# Patient Record
Sex: Male | Born: 1950
Health system: Southern US, Community
[De-identification: ages and names within clinical notes are randomized; demographics above are authoritative.]

## PROBLEM LIST (undated history)

## (undated) DIAGNOSIS — E785 Hyperlipidemia, unspecified: Secondary | ICD-10-CM

## (undated) DIAGNOSIS — D689 Coagulation defect, unspecified: Principal | ICD-10-CM

## (undated) DIAGNOSIS — I82409 Acute embolism and thrombosis of unspecified deep veins of unspecified lower extremity: Secondary | ICD-10-CM

## (undated) DIAGNOSIS — I82509 Chronic embolism and thrombosis of unspecified deep veins of unspecified lower extremity: Secondary | ICD-10-CM

## (undated) DIAGNOSIS — D6851 Activated protein C resistance: Secondary | ICD-10-CM

## (undated) DIAGNOSIS — Z974 Presence of external hearing-aid: Secondary | ICD-10-CM

## (undated) DIAGNOSIS — D6859 Other primary thrombophilia: Secondary | ICD-10-CM

## (undated) DIAGNOSIS — G43909 Migraine, unspecified, not intractable, without status migrainosus: Secondary | ICD-10-CM

## (undated) DIAGNOSIS — M25879 Other specified joint disorders, unspecified ankle and foot: Secondary | ICD-10-CM

## (undated) DIAGNOSIS — D6852 Prothrombin gene mutation: Secondary | ICD-10-CM

## (undated) DIAGNOSIS — I2699 Other pulmonary embolism without acute cor pulmonale: Secondary | ICD-10-CM

## (undated) DIAGNOSIS — I8390 Asymptomatic varicose veins of unspecified lower extremity: Secondary | ICD-10-CM

## (undated) DIAGNOSIS — Z8669 Personal history of other diseases of the nervous system and sense organs: Secondary | ICD-10-CM

## (undated) DIAGNOSIS — K219 Gastro-esophageal reflux disease without esophagitis: Secondary | ICD-10-CM

## (undated) DIAGNOSIS — I493 Ventricular premature depolarization: Secondary | ICD-10-CM

## (undated) DIAGNOSIS — D6861 Antiphospholipid syndrome: Secondary | ICD-10-CM

## (undated) DIAGNOSIS — I499 Cardiac arrhythmia, unspecified: Secondary | ICD-10-CM

## (undated) HISTORY — DX: Ventricular premature depolarization: I49.3

## (undated) HISTORY — DX: Other specified joint disorders, unspecified ankle and foot: M25.879

## (undated) HISTORY — DX: Gastro-esophageal reflux disease without esophagitis: K21.9

## (undated) HISTORY — DX: Activated protein C resistance: D68.51

## (undated) HISTORY — DX: Other primary thrombophilia: D68.59

## (undated) HISTORY — DX: Other pulmonary embolism without acute cor pulmonale: I26.99

## (undated) HISTORY — DX: Chronic embolism and thrombosis of unspecified deep veins of unspecified lower extremity: I82.509

## (undated) HISTORY — DX: Asymptomatic varicose veins of unspecified lower extremity: I83.90

## (undated) HISTORY — DX: Acute embolism and thrombosis of unspecified deep veins of unspecified lower extremity: I82.409

## (undated) HISTORY — PX: APPENDECTOMY: SHX54

## (undated) HISTORY — DX: Coagulation defect, unspecified: D68.9

## (undated) HISTORY — PX: FINGER SURGERY: SHX640

## (undated) HISTORY — DX: Migraine, unspecified, not intractable, without status migrainosus: G43.909

## (undated) HISTORY — DX: Personal history of other diseases of the nervous system and sense organs: Z86.69

## (undated) HISTORY — DX: Prothrombin gene mutation: D68.52

## (undated) HISTORY — DX: Presence of external hearing-aid: Z97.4

## (undated) HISTORY — DX: Antiphospholipid syndrome: D68.61

## (undated) HISTORY — DX: Hyperlipidemia, unspecified: E78.5

---

## 1990-09-05 HISTORY — PX: PATELLA RELEASE AND MANIPULATION: SHX2180

## 2001-03-05 ENCOUNTER — Encounter: Admission: RE | Admit: 2001-03-05 | Discharge: 2001-03-05 | Payer: Self-pay | Admitting: Internal Medicine

## 2001-03-05 ENCOUNTER — Inpatient Hospital Stay (HOSPITAL_COMMUNITY): Admission: AD | Admit: 2001-03-05 | Discharge: 2001-03-11 | Payer: Self-pay | Admitting: Internal Medicine

## 2001-03-05 ENCOUNTER — Encounter: Payer: Self-pay | Admitting: Internal Medicine

## 2001-03-06 ENCOUNTER — Encounter: Payer: Self-pay | Admitting: Internal Medicine

## 2001-06-22 ENCOUNTER — Ambulatory Visit: Admission: RE | Admit: 2001-06-22 | Discharge: 2001-06-22 | Payer: Self-pay | Admitting: Internal Medicine

## 2001-07-05 ENCOUNTER — Ambulatory Visit (HOSPITAL_COMMUNITY): Admission: RE | Admit: 2001-07-05 | Discharge: 2001-07-05 | Payer: Self-pay | Admitting: Internal Medicine

## 2001-07-05 ENCOUNTER — Encounter: Payer: Self-pay | Admitting: Internal Medicine

## 2001-07-19 ENCOUNTER — Ambulatory Visit (HOSPITAL_COMMUNITY): Admission: RE | Admit: 2001-07-19 | Discharge: 2001-07-19 | Payer: Self-pay | Admitting: Oncology

## 2001-07-19 ENCOUNTER — Encounter: Payer: Self-pay | Admitting: Oncology

## 2001-09-18 ENCOUNTER — Encounter: Payer: Self-pay | Admitting: Internal Medicine

## 2001-09-18 ENCOUNTER — Encounter: Admission: RE | Admit: 2001-09-18 | Discharge: 2001-09-18 | Payer: Self-pay | Admitting: Internal Medicine

## 2001-11-07 ENCOUNTER — Encounter: Payer: Self-pay | Admitting: Internal Medicine

## 2001-11-07 ENCOUNTER — Ambulatory Visit (HOSPITAL_COMMUNITY): Admission: RE | Admit: 2001-11-07 | Discharge: 2001-11-07 | Payer: Self-pay | Admitting: Internal Medicine

## 2002-04-04 ENCOUNTER — Ambulatory Visit (HOSPITAL_COMMUNITY): Admission: RE | Admit: 2002-04-04 | Discharge: 2002-04-04 | Payer: Self-pay | Admitting: Neurology

## 2002-04-04 ENCOUNTER — Encounter: Payer: Self-pay | Admitting: Neurology

## 2002-05-13 ENCOUNTER — Encounter: Payer: Self-pay | Admitting: Neurology

## 2002-05-13 ENCOUNTER — Ambulatory Visit (HOSPITAL_COMMUNITY): Admission: RE | Admit: 2002-05-13 | Discharge: 2002-05-13 | Payer: Self-pay | Admitting: Neurology

## 2003-09-06 HISTORY — PX: ANKLE SURGERY: SHX546

## 2004-03-10 ENCOUNTER — Ambulatory Visit (HOSPITAL_BASED_OUTPATIENT_CLINIC_OR_DEPARTMENT_OTHER): Admission: RE | Admit: 2004-03-10 | Discharge: 2004-03-10 | Payer: Self-pay | Admitting: Orthopedic Surgery

## 2004-03-10 ENCOUNTER — Ambulatory Visit (HOSPITAL_COMMUNITY): Admission: RE | Admit: 2004-03-10 | Discharge: 2004-03-10 | Payer: Self-pay | Admitting: Orthopedic Surgery

## 2004-08-03 ENCOUNTER — Encounter: Admission: RE | Admit: 2004-08-03 | Discharge: 2004-08-03 | Payer: Self-pay | Admitting: Internal Medicine

## 2005-05-13 ENCOUNTER — Ambulatory Visit: Payer: Self-pay | Admitting: Oncology

## 2006-05-18 ENCOUNTER — Ambulatory Visit: Payer: Self-pay | Admitting: Oncology

## 2006-05-22 LAB — CBC WITH DIFFERENTIAL/PLATELET
Basophils Absolute: 0.1 10*3/uL (ref 0.0–0.1)
EOS%: 2 % (ref 0.0–7.0)
Eosinophils Absolute: 0.1 10*3/uL (ref 0.0–0.5)
HCT: 42.1 % (ref 38.7–49.9)
HGB: 14 g/dL (ref 13.0–17.1)
LYMPH%: 29.4 % (ref 14.0–48.0)
MCH: 28.8 pg (ref 28.0–33.4)
MCHC: 33.3 g/dL (ref 32.0–35.9)
MCV: 86.4 fL (ref 81.6–98.0)
MONO#: 0.5 10*3/uL (ref 0.1–0.9)
MONO%: 7.9 % (ref 0.0–13.0)
RDW: 12.4 % (ref 11.2–14.6)

## 2006-05-22 LAB — PROTIME-INR
INR: 4.5 — ABNORMAL HIGH (ref 2.00–3.50)
Protime: 54 Seconds — ABNORMAL HIGH (ref 10.6–13.4)

## 2006-09-05 HISTORY — PX: CHOLECYSTECTOMY: SHX55

## 2006-11-27 ENCOUNTER — Encounter: Admission: RE | Admit: 2006-11-27 | Discharge: 2006-11-27 | Payer: Self-pay | Admitting: Internal Medicine

## 2007-02-09 ENCOUNTER — Encounter: Admission: RE | Admit: 2007-02-09 | Discharge: 2007-02-09 | Payer: Self-pay | Admitting: Orthopedic Surgery

## 2007-03-30 ENCOUNTER — Ambulatory Visit (HOSPITAL_COMMUNITY): Admission: RE | Admit: 2007-03-30 | Discharge: 2007-03-30 | Payer: Self-pay | Admitting: *Deleted

## 2007-04-03 ENCOUNTER — Ambulatory Visit (HOSPITAL_COMMUNITY): Admission: RE | Admit: 2007-04-03 | Discharge: 2007-04-04 | Payer: Self-pay | Admitting: General Surgery

## 2007-04-03 ENCOUNTER — Encounter (INDEPENDENT_AMBULATORY_CARE_PROVIDER_SITE_OTHER): Payer: Self-pay | Admitting: General Surgery

## 2007-05-17 ENCOUNTER — Ambulatory Visit: Payer: Self-pay | Admitting: Oncology

## 2007-05-21 LAB — CBC WITH DIFFERENTIAL/PLATELET
BASO%: 0.9 % (ref 0.0–2.0)
HCT: 39.8 % (ref 38.7–49.9)
LYMPH%: 34.2 % (ref 14.0–48.0)
MCHC: 34.2 g/dL (ref 32.0–35.9)
MCV: 85.8 fL (ref 81.6–98.0)
MONO%: 7.1 % (ref 0.0–13.0)
NEUT#: 3.8 10*3/uL (ref 1.5–6.5)
Platelets: 247 10*3/uL (ref 145–400)
RBC: 4.64 10*6/uL (ref 4.20–5.71)
RDW: 11.5 % (ref 11.2–14.6)

## 2007-05-21 LAB — PROTIME-INR

## 2007-07-27 ENCOUNTER — Encounter: Admission: RE | Admit: 2007-07-27 | Discharge: 2007-07-27 | Payer: Self-pay | Admitting: Neurology

## 2008-05-15 ENCOUNTER — Ambulatory Visit: Payer: Self-pay | Admitting: Oncology

## 2008-05-20 LAB — CBC WITH DIFFERENTIAL/PLATELET
HCT: 44.7 % (ref 38.7–49.9)
HGB: 14.8 g/dL (ref 13.0–17.1)
MCH: 28.4 pg (ref 28.0–33.4)
MCHC: 33.1 g/dL (ref 32.0–35.9)
MONO%: 7 % (ref 0.0–13.0)
Platelets: 238 10*3/uL (ref 145–400)
RDW: 12.7 % (ref 11.2–14.6)

## 2009-03-08 ENCOUNTER — Emergency Department (HOSPITAL_COMMUNITY): Admission: EM | Admit: 2009-03-08 | Discharge: 2009-03-09 | Payer: Self-pay | Admitting: Emergency Medicine

## 2009-03-09 ENCOUNTER — Encounter (INDEPENDENT_AMBULATORY_CARE_PROVIDER_SITE_OTHER): Payer: Self-pay | Admitting: Internal Medicine

## 2009-03-09 ENCOUNTER — Ambulatory Visit: Payer: Self-pay | Admitting: Vascular Surgery

## 2009-03-09 ENCOUNTER — Ambulatory Visit (HOSPITAL_COMMUNITY): Admission: RE | Admit: 2009-03-09 | Discharge: 2009-03-09 | Payer: Self-pay | Admitting: Psychiatry

## 2009-05-15 ENCOUNTER — Ambulatory Visit: Payer: Self-pay | Admitting: Oncology

## 2009-05-19 LAB — CBC WITH DIFFERENTIAL/PLATELET
BASO%: 0.4 % (ref 0.0–2.0)
Basophils Absolute: 0 10*3/uL (ref 0.0–0.1)
EOS%: 2.1 % (ref 0.0–7.0)
Eosinophils Absolute: 0.1 10*3/uL (ref 0.0–0.5)
HCT: 42 % (ref 38.4–49.9)
HGB: 14.1 g/dL (ref 13.0–17.1)
LYMPH%: 27.9 % (ref 14.0–49.0)
MCV: 85.9 fL (ref 79.3–98.0)
MONO#: 0.5 10*3/uL (ref 0.1–0.9)
MONO%: 6.7 % (ref 0.0–14.0)
NEUT#: 4.2 10*3/uL (ref 1.5–6.5)
NEUT%: 62.9 % (ref 39.0–75.0)
RDW: 13.4 % (ref 11.0–14.6)
WBC: 6.7 10*3/uL (ref 4.0–10.3)
lymph#: 1.9 10*3/uL (ref 0.9–3.3)

## 2009-05-19 LAB — PROTIME-INR: INR: 2.8 (ref 2.00–3.50)

## 2009-07-02 ENCOUNTER — Ambulatory Visit: Payer: Self-pay | Admitting: Sports Medicine

## 2009-07-02 DIAGNOSIS — R269 Unspecified abnormalities of gait and mobility: Secondary | ICD-10-CM | POA: Insufficient documentation

## 2009-07-02 DIAGNOSIS — S838X9A Sprain of other specified parts of unspecified knee, initial encounter: Secondary | ICD-10-CM

## 2009-07-02 DIAGNOSIS — S86819A Strain of other muscle(s) and tendon(s) at lower leg level, unspecified leg, initial encounter: Secondary | ICD-10-CM

## 2009-10-05 ENCOUNTER — Encounter: Admission: RE | Admit: 2009-10-05 | Discharge: 2009-10-05 | Payer: Self-pay | Admitting: Internal Medicine

## 2009-12-31 ENCOUNTER — Encounter: Admission: RE | Admit: 2009-12-31 | Discharge: 2009-12-31 | Payer: Self-pay | Admitting: Otolaryngology

## 2010-02-25 ENCOUNTER — Encounter: Admission: RE | Admit: 2010-02-25 | Discharge: 2010-02-25 | Payer: Self-pay | Admitting: Internal Medicine

## 2010-05-14 ENCOUNTER — Ambulatory Visit: Payer: Self-pay | Admitting: Oncology

## 2010-05-18 LAB — CBC WITH DIFFERENTIAL/PLATELET
Basophils Absolute: 0 10*3/uL (ref 0.0–0.1)
MCH: 29.1 pg (ref 27.2–33.4)
MCHC: 33.5 g/dL (ref 32.0–36.0)
MONO%: 7 % (ref 0.0–14.0)
NEUT#: 4.1 10*3/uL (ref 1.5–6.5)
NEUT%: 61.7 % (ref 39.0–75.0)
WBC: 6.7 10*3/uL (ref 4.0–10.3)

## 2010-05-18 LAB — COMPREHENSIVE METABOLIC PANEL
Albumin: 4.2 g/dL (ref 3.5–5.2)
BUN: 18 mg/dL (ref 6–23)
CO2: 29 mEq/L (ref 19–32)
Calcium: 8.8 mg/dL (ref 8.4–10.5)
Chloride: 106 mEq/L (ref 96–112)
Creatinine, Ser: 1.16 mg/dL (ref 0.40–1.50)
Glucose, Bld: 106 mg/dL — ABNORMAL HIGH (ref 70–99)
Potassium: 3.7 mEq/L (ref 3.5–5.3)
Sodium: 140 mEq/L (ref 135–145)
Total Bilirubin: 0.8 mg/dL (ref 0.3–1.2)
Total Protein: 7 g/dL (ref 6.0–8.3)

## 2010-05-18 LAB — PROTIME-INR
INR: 3.7 — ABNORMAL HIGH (ref 2.00–3.50)
Protime: 44.4 Seconds — ABNORMAL HIGH (ref 10.6–13.4)

## 2010-09-26 ENCOUNTER — Encounter: Payer: Self-pay | Admitting: Internal Medicine

## 2010-12-12 LAB — CBC
HCT: 38.7 % — ABNORMAL LOW (ref 39.0–52.0)
MCV: 88.6 fL (ref 78.0–100.0)
Platelets: 207 10*3/uL (ref 150–400)
RBC: 4.37 MIL/uL (ref 4.22–5.81)
RDW: 13.2 % (ref 11.5–15.5)
WBC: 5.6 10*3/uL (ref 4.0–10.5)

## 2010-12-12 LAB — POCT I-STAT, CHEM 8
Calcium, Ion: 1.11 mmol/L — ABNORMAL LOW (ref 1.12–1.32)
HCT: 40 % (ref 39.0–52.0)

## 2010-12-12 LAB — PROTIME-INR: Prothrombin Time: 38.5 seconds — ABNORMAL HIGH (ref 11.6–15.2)

## 2011-01-18 NOTE — Op Note (Signed)
NAME:  PANFILO, KETCHUM NO.:  [id number]   MEDICAL RECORD NO.:  [id number]          PATIENT TYPE:  OIB   LOCATION:  1435                         FACILITY:  [facility]   PHYSICIAN:  Anselm Pancoast. Weatherly, M.D.DATE OF BIRTH:  November 08, 1950   DATE OF PROCEDURE:  04/03/2007  DATE OF DISCHARGE:                               OPERATIVE REPORT   PREOPERATIVE DIAGNOSES:  1. Chronic/subacute cholecystitis with stones.  2. History of multiple pulmonary emboli.   POSTOPERATIVE DIAGNOSES:  1. Chronic/subacute cholecystitis with stones.  2. History of multiple pulmonary emboli.   OPERATION:  Laparoscopic cholecystectomy with cholangiogram.  General  anesthesia.   SURGEON:  Anselm Pancoast. Zachery Dakins, M.D.   ASSISTANT:  Nurse.   HISTORY:  Don Ruiz is a 60 year old male who I first saw in the  office on Friday when he had three days of kind of persistent recurrent  right upper quadrant pain.  He had been seen by Dr. Valentina Lucks, his regular  physician who had attained an ultrasound of gallbladder early on Friday  and showed kind of a thickened gallbladder with stones.  He has a  history of multiple PE's.  He is followed by Dr. Valentina Lucks for prothrombin  times and also Dr. Cyndie Chime, hematologist and he definitely needs  chronic anticoagulation and we ask him to stop his Coumadin on Friday,  switched him on over to Lovenox starting on Saturday.  His INR was 1.4  today and we thought we could safely proceed on with a laparoscopic  cholecystectomy.  He has been without the Lovenox now for approximately  24 hours.  Preoperatively was given 3 grams of Unasyn.  He has PAS  stockings, taken back to the operative suite.  Induction of general  anesthesia, endotracheal tube, oral tube NG was placed into the stomach.  The abdomen was then shaved around the umbilicus.  He has had an  appendectomy, kind of a large paramedian incision and we clipped the  area in the upper abdomen.  Sharp dissection  after Betadine prep  identifying the fascia.  This was opened, small incision made through  the peritoneum after two Kochers on the fascia and then a pursestring  suture of 0-0 Vicryl placed, and Hasson cannula introduced.  When  inserting the camera in the upper abdomen I was surprised to see that he  has got a subacutely inflamed gallbladder, was thickened and the upper  10 mm trocar was placed under direct vision.  The two lateral 5 mm was  placed under the direct vision also.  The gallbladder was tense but  fortunately I thought I could go ahead and dissected out without  actually decompressed the gallbladder and the peritoneum around the  proximal portion of gallbladder was carefully opened.  The little  adipose tissue was stripped down and then I could identify the cystic  artery that was doubly clipped proximally, singly distally and divided.  I could then encompass the cystic duct which was quite prominent and I  placed a clip across the distal cystic duct gallbladder junction.  A  Cook catheter was introduced a little  opening proximal to the clip and  held in place with clip.  Cholangiograms then obtained.  There was good  flow of dye into the common bile duct and common hepatic duct visualized  and looks like about 3 cm cystic duct remnant.  I stripped this down  slightly so I could put three clips after I removed the catheter across  the cystic duct.  I got across that satisfactory and then divided the  cystic duct.  Then the peritoneum around the distal posterior portion of  gallbladder was carefully kind of  opened up.  I clipped the little area  where there may be a posterior branch of the cystic artery and then used  electrocautery to free the gallbladder from its bed.  As we were  finalizing the dissection there was a little bit of bile spillage.  I  placed the gallbladder within an EndoCatch bag and then thoroughly  irrigated the right upper quadrant.  Hemostasis was good  and I used the  electrocautery to kind of cauterize any areas of question and then  switched the camera to the upper 10 mm port and withdrew the bag  containing the gallbladder.  I put a figure-of-eight of 0-0 Vicryl in  addition to the pursestring at the umbilicus, tied them both and then  anesthetized the fascia.  All total about 22 mL of Marcaine with  Adrenalin was used.  The little irrigating fluid that we had used was  aspirated and then the 5 mm ports withdrawn under direct vision.  No  evidence of bleeding and then the carbon dioxide released and the upper  10 mm port withdrawn.  The subcutaneous wounds were closed with 4-0  Vicryl.  Benzoin and Steri-Strips on the skin.  The patient tolerated  procedure nicely and Dr. Cyndie Chime suggested that we start him on  Lovenox at a reduced dose this evening and then back on his regular dose  tomorrow and will restart Coumadin today.  Hopefully he will be ready to  be discharged tomorrow and will be followed by Dr. Valentina Lucks for  prothrombin times probably continue the Lovenox I expect until Monday.           ______________________________  Anselm Pancoast. Zachery Dakins, M.D.     WJW/MEDQ  D:  04/03/2007  T:  04/04/2007  Job:  [id number]   cc:   Dr. Mauro Kaufmann M. Cyndie Chime, M.D.  Fax: [phone]

## 2011-01-21 NOTE — H&P (Signed)
[place]. [facility]  Patient:    INMAN, FETTIG                         MRN: [id number] Adm. Date:  03/05/01 Attending:  Thora Lance, M.D. Dictator:   N.P.                         History and Physical  DATE OF BIRTH:  1951-08-07  CHIEF COMPLAINT:  Dyspnea, chest pain.  HISTORY OF PRESENT ILLNESS:  Don Ruiz is a very pleasant 60 year old white male patient of Thora Lance, M.D. who presents to the office today with a 9-day history of dyspnea on minimal exertion and pleuritic anterior chest pain.  The patient describes lower anterior chest burning that occurs all the time, but definitely worse with breathing.  Sometimes it is associated with sweats and nausea.  Sometimes, he will get some palpitations with it.  He has been dyspneic with the least amount of activity during this time.  No fever, chills, or cough.  No peripheral edema.  He had been on prednisone several days prior to his symptoms for problems he was having with a left ear.  He saw an ENT physician at that time who felt like he had a viral or a vascular problem with the left ear.  He was also on antivirals during that time.  The dyspnea and chest pain developed while on prednisone.  The prednisone did not seem to help his symptoms.  In the office today, he was found to be in no acute distress.  Due to his history of DVT in 1992 postoperatively for knee surgery, a spiral CT of the chest was obtained.  It showed bilateral clots in the lower lung fields, worse on the right side.  He is being admitted for the same.  REVIEW OF SYSTEMS:  No fever or chills.  HEENT: As above.  No sore throat. LUNGS: Dyspnea with the least amount of exertion.  CHEST: Burning in the lower sternal area, worse with taking in deep breaths.  ABDOMEN: Some nausea rarely, no vomiting.  EXTREMITIES: No edema.  He does tell me that he had some sharp posterior calf pain that lasted for about a second several  weeks back.  He cannot tell me anymore specifics relating to this.  PAST MEDICAL HISTORY:  Infrequent migraine headaches relieved by Imitrex. History of DVT, November of 1992, postoperatively for knee surgery.  Coumadin x 3 months.  Questionable hepatitis in 1972.  Hepatitis surface antibody has been negative and LFTs are normal.  Family history of coronary artery disease. He had three brothers with CAD in their early 109s.  All siblings are on cholesterol medication.  PAST SURGICAL HISTORY:  Appendectomy in May of 1956.  Compartmental syndrome, both legs, November of 1984.  Patella repair in November of 1992 complicated by postoperative DVT.  SOCIAL HISTORY:  He is married.  He does not smoke.  No alcohol or drugs.  He has three daughters ages 63 through 21.  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  FAMILY HISTORY:  Father died at age 45 with a stroke.  He had a history of coronary artery disease in his 26s and had myocardial infarction at age 8. Mother died at 32 with complications from breast cancer.  She had hypertension, diabetes, and fibromyalgia.  Three brothers have coronary artery disease in their  30s or early 48s, all siblingus with hypercholesterolemia.  MEDICATIONS:  Imitrex rarely.  ALLERGIES:  No known drug allergies.  PHYSICAL EXAMINATION:  VITAL SIGNS:  His weight is 214.5 which is stable, temperature 97.6, pulse 64, respirations 22 breaths per minute, blood pressure 120/70.  O2 saturations on room air 98%.  GENERAL:  He is a very pleasant male in no acute distress.  HEENT:  PERRLA.  No nystagmus.  Ears, nose, and throat without abnormality.  NECK:  No JVD or carotid bruits auscultated.  HEART:  Regular rate and rhythm.  I hear no murmur or rub.  Breath sounds reveal a faint wheeze in the right lower lobe, otherwise clear.  There is no chest wall tenderness to palpation.  ABDOMEN:  Normal bowel sounds with no tenderness on  palpation.  GENITOURINARY: RECTAL:  Deferred.  NEUROLOGICAL:  Nonfocal.  EXTREMITIES:  No edema, no cords, +2 peripheral pulses.  Spiral CT scan done at [facility] reveals bilateral pulmonary emboli, right worse than left per Dr. Allyson Sabal.  IMPRESSION:  Bilateral pulmonary emboli, history of postoperative deep venous thrombosis in 1992.  PLAN:  IV heparin and Coumadin per pharmacy protocol.  Dr. Valentina Lucks and associates to follow. DD:  03/05/01 TD:  03/05/01 Job: [id number]

## 2011-01-21 NOTE — Discharge Summary (Signed)
[place]. [facility]  Patient:    Don Ruiz, Don Ruiz Visit Number: [id number] MRN: [id number]          Service Type: MED Location: [phone] Attending Physician:  Lillia Mountain Admit Date:  03/05/2001 Discharge Date: 03/11/2001   CC:         Darci Needle, M.D.                           Discharge Summary  REASON FOR ADMISSION:  Mr. Arvil Chaco is a 60 year old white male who presented to the office with nine days history of dyspnea on minimal exertion and had pleuritic anterior chest pain.  The patient is an avid runner and began experiencing dyspnea with diaphoresis with running only a short distance.  He occasionally would get diaphoretic with nausea.  The patient has a history of DVT in 1992.  He had been on Coumadin for three months at that time.  The chest CT scan was obtained and showed bilateral lung clots worse in the right side.  PHYSICAL EXAMINATION:  Significant findings:  GENERAL:  He looked well.  VITAL SIGNS:  Temperature 97.6, heart rate 64, respirations 22, blood pressure 120/70.  Oxygen saturation was 97% on room air.  LUNGS:  Show wheezes at the right lower lobe, otherwise clear.  HEART:  Unremarkable.  EXTREMITIES:  Showed no edema or tender to palpation.  LABORATORIES:  WBC 12.2, hemoglobin 14.5, platelet count 203.  PT 12.6, PTT 34.  Chemistry, sodium 138, potassium 4.0, chloride 102, bicarbonate 30, BUN 16, creatinine 0.9, glucose 95, calcium 8.8.  Total protein 6.8, albumin 3.6, AST 28, ALT 38, alkaline phosphatase 68, total bilirubin 1.0.  Urinalysis was negative.  CT scan of the chest showed bilateral lower lobe pulmonary emboli, right greater than left.  HOSPITAL COURSE: #1 - PULMONARY EMBOLUS:  The patient was admitted for bilateral pulmonary emboli.  He was started on IV heparin and immediately begun on Coumadin.  A lower extremity Doppler exam was done and showed no evidence of venous DVT. In the first two  hospital days, the patient experienced significant right-sided back and chest pleuritic pain.  A chest x-ray was done and was negative.  The patient also had fever as high as 102.6.  Blood and urine cultures were negative.  There was no obvious infection and this was felt secondary to his thromboembolic disease.  The patient was treated with Percocet p.r.n. for his pain.  The patients chest pain slowly improved throughout the hospitalization.  He was able to ambulate without significant shortness of breath or pain at discharge, and was discharged after six days. The patient did undergo a hypercoagulability workup as this was his second episode of thromboembolic disease.  The patient was heterozygous for the factor V mutation.  The patient was heterozygous for the prothrombin gene mutation.  Anticardiolipin antibodies showed normal levels of IgA and IgG antibodies, but an elevated IgM antibody.  Protein S functional level was mildly decreased at 67 (probably reflects the acute thrombosis).  The remaining workup including antithrombin III activity, protein C activity, lupus anticoagulant, and homocysteine level were all negative or normal.  #2 - QUESTIONABLE RIGHT UPPER LOBE NODULE:  There was a question of a very small right upper lobe nodule on the CT scan.  Radiology recommended repeating the CT scan in three or four months to re-evaluate this.  DISCHARGE DIAGNOSES: 1. Bilateral pulmonary emboli. 2. Questionable right upper  lobe nodule. 3. Factor V Leiden and prothrombin gene mutation, heterozygosity.  PROCEDURES:  CT scan of the chest.  DISCHARGE MEDICATIONS: 1. Coumadin 5 mg q.d. 2. Percocet 5/325 one or two q.4h. p.r.n. pain.  ACTIVITY:  Do not return to work until the follow-up appointment.  No heavy exertion until follow-up appointment.  FOLLOW-UP:  Follow-up will be on July 15, with Dr. Valentina Lucks. Attending Physician:  Lillia Mountain DD:  03/21/01 TD:  03/21/01 Job:  [id number]

## 2011-01-21 NOTE — Op Note (Signed)
NAME:  Don Ruiz, Don Ruiz NO.:  [id number]   MEDICAL RECORD NO.:  [id number]                   PATIENT TYPE:  AMB   LOCATION:  DSC                                  FACILITY:  [facility]   PHYSICIAN:  Harvie Junior, M.D.                DATE OF BIRTH:  06-Jan-1951   DATE OF PROCEDURE:  DATE OF DISCHARGE:                                 OPERATIVE REPORT   DATE OF OPERATION:  March 10, 2004.   PREOPERATIVE DIAGNOSIS:  Chronic ankle pain, right.   POSTOPERATIVE DIAGNOSES:  1. Lateral gutter impingement.  2. Anterior tibial spur.  3. Loose body, ankle joint.   PRINCIPAL PROCEDURES:  1. Debridement of lateral gutter impingement.  2. Removal of anterior tibial spur.  3. Removal of loose body.   SURGEON:  Harvie Junior, MD.   ASSISTANT:  __________   ANESTHESIA:  General.   BRIEF HISTORY:  This is a 60 year old male with a long history of having had  right ankle pain.  He ultimately had tried to run on several occasions.  He  would have sudden onset severe pain.  Because of this sudden onset severe  pain, ultimately it was felt that the patient must have some form of loose  body.  He had been treated with injection therapy, which seems to help  wonderfully, but ultimately had had recurrent episodes of severe pain.  At  that time, it was felt that the most appropriate course of action for him  would be injection therapy.  This was accomplished and he got excellent  relief with that.  It was felt that operative ankle arthroscopy would be the  appropriate course of action if his pain recurred and, in fact, it did.  He  was brought to the operating room for that procedure.   PROCEDURE:  The patient was brought to the operating room and after adequate  anesthesia was obtained with general anesthetic, the patient was placed on  the operating table, and the right leg was prepped and draped in the usual  sterile fashion.  Following this, the leg was exsanguinated  and blood  pressure insufflated to 250 mmHg at the calf.  The patient had been given  relaxation agents, and the ankle underwent evaluation.  There was excellent  access into the lateral gutter which showed to have fairly significant  synovitic changes as well as a lateral ligamentous impingement anteriorly.  This was debrided with a suction shaver and a biter back to a smooth and  stable rim.  The lateral gutter was then opened and cleaned and was  unremarkable.  At this time, attention was turned across the front of the  ankle where there was noted to be a large, anterior, tibial spur.  The  medial gutter was identified and noted to have a osseocartilaginous loose  spot, which was removed with a grasper.  The  medial gutter was then cleaned.  Following this, the anterior tibial spur was taken down with a motorized  bur.  Following this, the remainder of the joint was examined and noted to  have no significant degenerative changes.  At this point, the ankle was  copiously irrigated and suctioned dry.  The arthroscope portals were closed  with interrupted stitches and a sterile compressive dressing was applied as  well as a posterior plaster, and the patient was taken to the recovery room.  He was noted to be in a satisfactory condition.  The patient was done with a  protime of 20.  This was as per request per his hematologist, who had a  significant concern of possible blood clots, especially with the tourniquet  that was going to be used.  He is going to evaluate him later today at his  office for placement on heparin and Coumadin therapy simultaneously until he  is back therapeutic with a comfortable level with Coumadin.  At this point,  the patient will be maintained in his posterior plaster with ice and  elevation for 5 days.  We will see him back in the office at that point and  begin early range of motion.                                               Harvie Junior, M.D.     Ranae Plumber  D:  03/10/2004  T:  03/10/2004  Job:  [id number]

## 2011-05-17 ENCOUNTER — Other Ambulatory Visit: Payer: Self-pay | Admitting: Oncology

## 2011-05-17 ENCOUNTER — Encounter (HOSPITAL_BASED_OUTPATIENT_CLINIC_OR_DEPARTMENT_OTHER): Payer: 59 | Admitting: Oncology

## 2011-05-17 DIAGNOSIS — D689 Coagulation defect, unspecified: Secondary | ICD-10-CM

## 2011-05-17 DIAGNOSIS — Z7901 Long term (current) use of anticoagulants: Secondary | ICD-10-CM

## 2011-05-17 DIAGNOSIS — D6859 Other primary thrombophilia: Secondary | ICD-10-CM

## 2011-05-17 DIAGNOSIS — Z86718 Personal history of other venous thrombosis and embolism: Secondary | ICD-10-CM

## 2011-05-17 LAB — CBC WITH DIFFERENTIAL/PLATELET
Eosinophils Absolute: 0.1 10*3/uL (ref 0.0–0.5)
HCT: 42.4 % (ref 38.4–49.9)
MCV: 86.2 fL (ref 79.3–98.0)
MONO#: 0.5 10*3/uL (ref 0.1–0.9)
MONO%: 7.6 % (ref 0.0–14.0)
Platelets: 194 10*3/uL (ref 140–400)
RBC: 4.92 10*6/uL (ref 4.20–5.82)
RDW: 13.3 % (ref 11.0–14.6)
lymph#: 1.9 10*3/uL (ref 0.9–3.3)
nRBC: 0 % (ref 0–0)

## 2011-05-17 LAB — PROTIME-INR: Protime: 48 Seconds — ABNORMAL HIGH (ref 10.6–13.4)

## 2011-06-20 LAB — CBC
HCT: 40
Hemoglobin: 13.6
MCHC: 34.3
MCV: 85.8
Platelets: 249
Platelets: 254
RBC: 4.52
RBC: 4.67
RDW: 13.8
WBC: 6.8

## 2011-06-20 LAB — COMPREHENSIVE METABOLIC PANEL
ALT: 58 — ABNORMAL HIGH
AST: 70 — ABNORMAL HIGH
Albumin: 3.8
Albumin: 4
Alkaline Phosphatase: 64
Alkaline Phosphatase: 70
BUN: 17
BUN: 17
CO2: 28
Chloride: 103
Chloride: 110
GFR calc Af Amer: >60
Potassium: 4
Potassium: 4.1
Sodium: 136
Total Bilirubin: 1.1
Total Bilirubin: 1.4 — ABNORMAL HIGH
Total Protein: 7.5

## 2011-06-20 LAB — DIFFERENTIAL
Basophils Absolute: 0
Basophils Relative: 1
Eosinophils Relative: 2
Monocytes Absolute: 0.4
Neutro Abs: 4.1

## 2011-06-20 LAB — PROTIME-INR
INR: 1.1
Prothrombin Time: 14.7

## 2011-06-20 LAB — APTT: aPTT: 43 — ABNORMAL HIGH

## 2011-09-06 HISTORY — PX: FINGER TENDON REPAIR: SHX1640

## 2011-10-17 ENCOUNTER — Emergency Department (HOSPITAL_COMMUNITY): Payer: 59

## 2011-10-17 ENCOUNTER — Encounter (HOSPITAL_COMMUNITY): Payer: Self-pay

## 2011-10-17 ENCOUNTER — Emergency Department (HOSPITAL_COMMUNITY)
Admission: EM | Admit: 2011-10-17 | Discharge: 2011-10-17 | Disposition: A | Discharge status: Home / Self Care | Payer: 59 | Attending: Emergency Medicine | Admitting: Emergency Medicine

## 2011-10-17 DIAGNOSIS — S61209A Unspecified open wound of unspecified finger without damage to nail, initial encounter: Secondary | ICD-10-CM | POA: Insufficient documentation

## 2011-10-17 DIAGNOSIS — X58XXXA Exposure to other specified factors, initial encounter: Secondary | ICD-10-CM | POA: Insufficient documentation

## 2011-10-17 DIAGNOSIS — IMO0002 Reserved for concepts with insufficient information to code with codable children: Secondary | ICD-10-CM

## 2011-10-17 HISTORY — DX: Other pulmonary embolism without acute cor pulmonale: I26.99

## 2011-10-17 MED ORDER — OXYCODONE-ACETAMINOPHEN 5-325 MG PO TABS: 1.0000 | ORAL_TABLET | Freq: Four times a day (QID) | ORAL | Status: AC | PRN

## 2011-10-17 NOTE — ED Notes (Signed)
Patient here with left hand 5th digit laceration, cut same with circular saw, bleeding controlled with pressure. Patient is on coumadin

## 2011-10-17 NOTE — ED Provider Notes (Signed)
She was using a miter saw and accidentally lacerated his left little finger.  Patient has a 2 cm laceration over the PIP joint of his left little finger. Patient has intact flexion. His extension is almost intact with some difficulty he has trouble getting the last full extension in place.  Last tetanus was less than 10 years ago. Patient relates he has an appointment with his PCP in 2 days, Dr. Valentina Lucks, He states his orthopedist is Dr. Luiz Blare  Medical screening examination/treatment/procedure(s) were conducted as a shared visit with non-physician practitioner(s) and myself.  I personally evaluated the patient during the encounter Devoria Albe, MD, Franz Dell, MD 10/17/11 1500

## 2011-10-17 NOTE — ED Provider Notes (Signed)
See prior note   Ward Givens, MD 10/17/11 [phone]

## 2011-10-17 NOTE — ED Provider Notes (Signed)
4:33 PM   DG Finger Little Left (Final result)   Result time:10/17/11 1543    Final result by Rad Results In Interface (10/17/11 15:43:36)    Narrative:   *RADIOLOGY REPORT*  Clinical Data: Laceration  LEFT LITTLE FINGER 2+V  Comparison: None.  Findings: Soft tissue injury is evident. No evidence of fracture or radiopaque foreign object.  IMPRESSION: Soft tissue injury without evidence of fracture or radiopaque foreign object.  Original Report Authenticated By: Thomasenia Sales, M.D.   Patient care resumed from [facility]  Patient receives 2 cm laceration over dorsal side of the PIP left hand little finger.  Laceration done by my are soft.  Concern for weak extension and possible ligamental damage.  Patient is a patient of [facility] with Dr. Luiz Blare.  On call hand surgeon from this group has been paged for consult. Pt believes last tetanus booster to be < 5 yrs ago, but will check with his PCP at his appointment in 2 days , Dr. Valentina Lucks.   LACERATION REPAIR Performed by: Jaci Carrel Authorized by: Jaci Carrel Consent: Verbal consent obtained. Risks and benefits: risks, benefits and alternatives were discussed Consent given by: patient Patient identity confirmed: provided demographic data Prepped and Draped in normal sterile fashion Wound explored  Laceration Location: right dorsal surface of little finger over DIP  Laceration Length: 2cm  No Foreign Bodies seen or palpated  Anesthesia: local infiltration  Local anesthetic: lidocaine 2% with out epinephrine  Anesthetic total: 2 ml  Irrigation method: syringe Amount of cleaning: standard  Skin closure: 4.0 prolene  Number of sutures: 6  Technique: simple interupted  Patient tolerance: Patient tolerated the procedure well with no immediate complications.  Pt to follow up with Dr. Orlan Leavens tomorrow. Pt dc home with percocet.    Jaci Carrel, [place] 10/17/11 1726

## 2011-10-17 NOTE — ED Provider Notes (Signed)
See prior note   Ward Givens, MD 10/17/11 1851

## 2011-10-17 NOTE — ED Provider Notes (Signed)
History     CSN: [id number]  Arrival date & time 10/17/11  1359   First MD Initiated Contact with Patient 10/17/11 1447      Chief Complaint  Patient presents with  . Laceration    (Consider location/radiation/quality/duration/timing/severity/associated sxs/prior treatment) HPI Patient presents to the ED with a 2cm laceration over the dorsal 5th digit, crossing the DIP joint. Patient reports that he was using a miter saw when it slipped and cut his finger. Patient takes coumadin for history of PE.  Past Medical History  Diagnosis Date  . Pulmonary embolism     History reviewed. No pertinent past surgical history.  No family history on file.  History  Substance Use Topics  . Smoking status: Never Smoker   . Smokeless tobacco: Not on file  . Alcohol Use:       Review of Systems All pertinent positives and negatives reviewed in the history of present illness  Allergies  Review of patient's allergies indicates no known allergies.  Home Medications   Current Outpatient Rx  Name Route Sig Dispense Refill  . CELECOXIB 200 MG PO CAPS Oral Take 200 mg by mouth daily as needed. For pain    . GABAPENTIN 600 MG PO TABS Oral Take 300-600 mg by mouth See admin instructions. 600 in the morning and 300 at night    . ADULT MULTIVITAMIN W/MINERALS CH Oral Take 1 tablet by mouth daily.    Marland Kitchen PRAVASTATIN SODIUM 80 MG PO TABS Oral Take 80 mg by mouth daily.    . WARFARIN SODIUM 5 MG PO TABS Oral Take 5 mg by mouth daily.      BP 138/85  Pulse 66  Temp 97.9 F (36.6 C)  Resp 20  Wt 215 lb (97.523 kg)  SpO2 94%  Physical Exam  Constitutional: He is oriented to person, place, and time. He appears well-developed and well-nourished. No distress.  HENT:  Head: Normocephalic and atraumatic.  Musculoskeletal:       Arms:      Left hand: He exhibits decreased range of motion (Patient cannot extend distal joint even after digital block) and laceration.       Hands: Neurological:  He is alert and oriented to person, place, and time.  Skin: Skin is warm and dry. He is not diaphoretic.    ED Course  Procedures (including critical care time)  Will call ortho once x-rays are rresulted        MDM          Carlyle Dolly, PA-C 10/17/11 1521

## 2012-03-09 ENCOUNTER — Encounter: Payer: Self-pay | Admitting: Vascular Surgery

## 2012-03-12 ENCOUNTER — Ambulatory Visit (INDEPENDENT_AMBULATORY_CARE_PROVIDER_SITE_OTHER): Payer: 59 | Admitting: Vascular Surgery

## 2012-03-12 ENCOUNTER — Encounter: Payer: Self-pay | Admitting: Vascular Surgery

## 2012-03-12 VITALS — BP 123/82 | HR 66 | Resp 16 | Ht 76.0 in | Wt 220.0 lb

## 2012-03-12 DIAGNOSIS — I7102 Dissection of abdominal aorta: Secondary | ICD-10-CM

## 2012-03-12 DIAGNOSIS — I714 Abdominal aortic aneurysm, without rupture: Secondary | ICD-10-CM | POA: Insufficient documentation

## 2012-03-12 NOTE — Addendum Note (Signed)
Addended by: Sharee Pimple on: 03/12/2012 02:24 PM   Modules accepted: Orders

## 2012-03-12 NOTE — Progress Notes (Signed)
Subjective:     Patient ID: Don Ruiz, male   DOB: Jun 16, 1951, 61 y.o.   MRN: [id number]  HPI this 61 year old male was referred by Dr. Carolanne Grumbling for evaluation of possible localized dissection of terminal aorta. This patient had a CT scan performed 03/02/2012 to evaluate bilateral renal cysts which have been present for many years. The cysts appeared to be benign. Incidental finding included a very short segment of localized dissection in the terminal aorta. No abdominal aortic aneurysm was noted. There was very minimal dilatation of the aorta at the level of the dissection. Patient has no history of blunt abdominal or chest trauma. He does have a history of pulmonary embolus many years ago from a right lower extremity DVT. Also has history of palpitations followed by Dr. Verdis Prime. He has had no previous CT scans of the abdomen for comparison. Patient denies lower extremity claudication symptoms in the buttocks thigh or calf able to ambulate 2-3 miles  Past Medical History  Diagnosis Date  . Pulmonary embolism   . PVC (premature ventricular contraction)   . DVT (deep venous thrombosis) 1990, 2002    chronic hypercoagulobility.   . Hyperlipidemia   . Migraine headache   . H/O tinnitus   . Ankle impingement syndrome     Right ankle from bone spur  . GERD (gastroesophageal reflux disease)   . Wears hearing aid     History  Substance Use Topics  . Smoking status: Never Smoker   . Smokeless tobacco: Not on file  . Alcohol Use: No    Family History  Problem Relation Age of Onset  . Cancer Mother     Breast cancer  . Hypertension Mother   . Diabetes Mother   . Fibromyalgia Mother   . Stroke Mother   . Stroke Father   . Heart disease Father   . Alcohol abuse Sister   . Hyperlipidemia Sister   . Hypertension Sister   . Heart disease Brother     CABG at 40  . Stroke Brother   . Pulmonary embolism Brother   . Heart disease Other   . Hyperlipidemia Other   . Heart disease  Brother     MI at 65 CABG  . Heart disease Brother     CABG,   MI at 44  . Stroke Sister 57  . Heart disease Sister   . Cancer Sister     Breast Cancer  . Other Sister     acoustic tumor    Allergies  Allergen Reactions  . Niacin And Related     Hepatitis  . Phisohex (Hexachlorophene)     rash    Current outpatient prescriptions:celecoxib (CELEBREX) 200 MG capsule, Take 200 mg by mouth daily as needed. For pain, Disp: , Rfl: ;  gabapentin (NEURONTIN) 600 MG tablet, Take 300-600 mg by mouth See admin instructions. 600 in the morning and 300 at night, Disp: , Rfl: ;  Multiple Vitamin (MULITIVITAMIN WITH MINERALS) TABS, Take 1 tablet by mouth daily., Disp: , Rfl: ;  pravastatin (PRAVACHOL) 80 MG tablet, Take 80 mg by mouth daily., Disp: , Rfl:  warfarin (COUMADIN) 5 MG tablet, Take 5 mg by mouth daily., Disp: , Rfl:   BP 123/82  Pulse 66  Resp 16  Ht 6\' 4"  (1.93 m)  Wt 220 lb (99.791 kg)  BMI 26.78 kg/m2  Body mass index is 26.78 kg/(m^2).           Review of Systems positive for  palpitations, right leg edema, bilateral varicose veins, no hearing in right ear, migraine headaches. Denies chest pain, dyspnea on exertion, PND, orthopnea, hemoptysis.    Objective:   Physical Exam blood pressure 123 range 2 heart rate 66 respirations 16 Gen.-alert and oriented x3 in no apparent distress HEENT normal for age-absent hearing right ear  Lungs no rhonchi or wheezing Cardiovascular regular rhythm no murmurs carotid pulses 3+ palpable no bruits audible Abdomen soft nontender no palpable masses Musculoskeletal free of  major deformities Skin clear -no rashes Neurologic normal Lower extremities 3+ femoral and dorsalis pedis pulses palpable bilaterally with 1-2+ edema right lower extremity. Bulging varicosities in both legs between the knee and ankle. No hyperpigmentation or ulceration is noted.  Today I reviewed the CT angiogram by computer. The study was performed 10 days ago.  There is no evidence of abdominal aortic aneurysm. The diameter of the aorta proximally and distally is normal. There is a very short segment-1.5 cm near the terminal aorta where there appears to be a localized dissection with 2 discrete lemons which then reconversion above the aortic bifurcation. Common iliac artery origins appear normal          Assessment:     Area of localized dissection within the terminal aorta with no evidence of aneurysm or obstruction to lower extremities    Plan:     Reason for treatment would be if area of concern enlarges or obstruction to lower extremity flow occurs. Plan see patient back in followup in 6 months with repeat CT angiogram. He suddenly develops severe claudication or ischemia of lower extremity he was advised to report to emergency department

## 2012-05-03 ENCOUNTER — Telehealth: Payer: Self-pay | Admitting: Oncology

## 2012-05-03 NOTE — Telephone Encounter (Signed)
lmonvm for pt re appt for 10/8 and mailed schedule.

## 2012-06-12 ENCOUNTER — Other Ambulatory Visit: Payer: Self-pay | Admitting: Oncology

## 2012-06-12 ENCOUNTER — Ambulatory Visit (HOSPITAL_BASED_OUTPATIENT_CLINIC_OR_DEPARTMENT_OTHER): Payer: 59 | Admitting: Oncology

## 2012-06-12 ENCOUNTER — Encounter: Payer: Self-pay | Admitting: Oncology

## 2012-06-12 ENCOUNTER — Telehealth: Payer: Self-pay | Admitting: Oncology

## 2012-06-12 ENCOUNTER — Other Ambulatory Visit (HOSPITAL_BASED_OUTPATIENT_CLINIC_OR_DEPARTMENT_OTHER): Payer: 59 | Admitting: Lab

## 2012-06-12 VITALS — BP 120/78 | HR 67 | Temp 98.7°F | Resp 20 | Ht 76.0 in | Wt 227.8 lb

## 2012-06-12 DIAGNOSIS — D689 Coagulation defect, unspecified: Secondary | ICD-10-CM

## 2012-06-12 DIAGNOSIS — Z7901 Long term (current) use of anticoagulants: Secondary | ICD-10-CM

## 2012-06-12 DIAGNOSIS — D6861 Antiphospholipid syndrome: Secondary | ICD-10-CM

## 2012-06-12 DIAGNOSIS — I8390 Asymptomatic varicose veins of unspecified lower extremity: Secondary | ICD-10-CM

## 2012-06-12 DIAGNOSIS — D6859 Other primary thrombophilia: Secondary | ICD-10-CM | POA: Diagnosis present

## 2012-06-12 DIAGNOSIS — D6851 Activated protein C resistance: Secondary | ICD-10-CM

## 2012-06-12 DIAGNOSIS — Z86718 Personal history of other venous thrombosis and embolism: Secondary | ICD-10-CM

## 2012-06-12 DIAGNOSIS — IMO0001 Reserved for inherently not codable concepts without codable children: Secondary | ICD-10-CM

## 2012-06-12 DIAGNOSIS — I82509 Chronic embolism and thrombosis of unspecified deep veins of unspecified lower extremity: Secondary | ICD-10-CM | POA: Insufficient documentation

## 2012-06-12 DIAGNOSIS — I2699 Other pulmonary embolism without acute cor pulmonale: Secondary | ICD-10-CM

## 2012-06-12 DIAGNOSIS — D6852 Prothrombin gene mutation: Secondary | ICD-10-CM | POA: Insufficient documentation

## 2012-06-12 HISTORY — DX: Other primary thrombophilia: D68.59

## 2012-06-12 HISTORY — DX: Coagulation defect, unspecified: D68.9

## 2012-06-12 HISTORY — DX: Reserved for inherently not codable concepts without codable children: IMO0001

## 2012-06-12 HISTORY — DX: Asymptomatic varicose veins of unspecified lower extremity: I83.90

## 2012-06-12 HISTORY — DX: Antiphospholipid syndrome: D68.61

## 2012-06-12 HISTORY — DX: Other pulmonary embolism without acute cor pulmonale: I26.99

## 2012-06-12 HISTORY — DX: Activated protein C resistance: D68.51

## 2012-06-12 HISTORY — DX: Prothrombin gene mutation: D68.52

## 2012-06-12 LAB — CBC WITH DIFFERENTIAL/PLATELET
BASO%: 0.6 % (ref 0.0–2.0)
Basophils Absolute: 0 10*3/uL (ref 0.0–0.1)
EOS%: 1.2 % (ref 0.0–7.0)
HGB: 14 g/dL (ref 13.0–17.1)
MCH: 29.2 pg (ref 27.2–33.4)
MCHC: 33.7 g/dL (ref 32.0–36.0)
MCV: 86.5 fL (ref 79.3–98.0)
MONO%: 8.6 % (ref 0.0–14.0)
RDW: 13.1 % (ref 11.0–14.6)
lymph#: 1.8 10*3/uL (ref 0.9–3.3)

## 2012-06-12 LAB — PROTIME-INR: Protime: 39.6 Seconds — ABNORMAL HIGH (ref 10.6–13.4)

## 2012-06-12 NOTE — Progress Notes (Signed)
Hematology and Oncology Follow Up Visit  Don Ruiz [id number] 08/27/1951 61 y.o. 06/12/2012 3:59 PM   Principle Diagnosis: Encounter Diagnoses  Name Primary?  . Coagulopathy Yes  . Heterozygous factor V Leiden mutation   . Prothrombin gene mutation   . Antiphospholipid antibody syndrome   . Protein S deficiency   . DVT, recurrent, lower extremity, chronic   . Pulmonary embolism, bilateral   . Varicose veins      Interim History:   Follow-up visit for this pleasant 61 year old man with a complex coagulopathy due to factor V Leiden heterozygote, prothrombin gene mutation heterozygote, elevated anticardiolipin antibodies, and protein S deficiency,  status post right lower extremity DVT following knee surgery in 1992, status post recurrent right lower extremity DVT, and bilateral pulmonary emboli in July 2002.  He is on chronic Coumadin anticoagulation and has had no subsequent thrombotic events.  He has had some surgeries over the years using a Lovenox bridge without any complications.  He had a laparoscopic cholecystectomy 04/03/2007.  He had ankle surgery in the past due to an Achilles tendon problem.  He has had other orthopaedic problems involving his right elbow.  He is followed by Dr. Jodi Geralds.   Since his visit here last year he cut  the small finger of his left hand on a electric saw and required a number of sutures. A tourniquet was used for hemostasis and he did not have to stop his Coumadin.  Current dose of Coumadin is 5 mg daily. He was taking 7.5 mg one day of the week and 5 mg on the other days of the week but noted excessive bruising and cut his dose slightly. INR today is 3.3.  He tells me that his sister, age 16, recently had bilateral pulmonary emboli. She had genetic testing and also is a carrier for some of the gene mutations that he has but he doesn't know which ones. He has 3 children ages 73  to 10. I don't know if any of them have been tested.  Medications:  reviewed  Allergies:  Allergies  Allergen Reactions  . Niacin And Related     Hepatitis  . Phisohex (Hexachlorophene)     rash    Review of Systems: Constitutional:   No constitutional symptoms Respiratory: No cough or dyspnea Cardiovascular:  No chest pain or palpitations Gastrointestinal: No abdominal pain Genito-Urinary: Not questioned Musculoskeletal: No bone pain Neurologic: No headache or change in vision Skin: No rash Remaining ROS negative.  Physical Exam: Blood pressure 120/78, pulse 67, temperature 98.7 F (37.1 C), temperature source Oral, resp. rate 20, height 6\' 4"  (1.93 m), weight 227 lb 12.8 oz (103.329 kg). Wt Readings from Last 3 Encounters:  06/12/12 227 lb 12.8 oz (103.329 kg)  03/12/12 220 lb (99.791 kg)  10/17/11 215 lb (97.523 kg)     General appearance: Tall, well-nourished, Caucasian man HENNT: Pharynx no erythema or exudate Lymph nodes: No adenopathy Breasts: Lungs: Clear to auscultation resonant to percussion Heart: Regular rhythm no murmur Abdomen: Soft, nontender, no mass, no organomegaly Extremities: No edema, no calf tenderness. Varicose veins right lower extremity which are not prominent today Vascular: No cyanosis Neurologic: Motor strength 5 over 5 reflexes 1+ symmetric Skin: No rash or ecchymosis  Lab Results: Lab Results  Component Value Date   WBC 6.7 06/12/2012   HGB 14.0 06/12/2012   HCT 41.5 06/12/2012   MCV 86.5 06/12/2012   PLT 224 06/12/2012     Chemistry  Component Value Date/Time   NA 140 05/18/2010 1515   K 3.7 05/18/2010 1515   CL 106 05/18/2010 1515   CO2 29 05/18/2010 1515   BUN 18 05/18/2010 1515   CREATININE 1.16 05/18/2010 1515      Component Value Date/Time   CALCIUM 8.8 05/18/2010 1515   ALKPHOS 58 05/18/2010 1515   AST 27 05/18/2010 1515   ALT 27 05/18/2010 1515   BILITOT 0.8 05/18/2010 1515       Radiological Studies: No results found.  Impression and Plan: Complex coagulopathy secondary to multiple  inherited and acquired risk factors outlined above. He remains stable on chronic Coumadin anticoagulation. We had a discussion  the use of some of the new oral anticoagulants in particular Xarelto. His sister who just had a PE was started on Xarelto instead of Coumadin. I discussed the risks versus benefits of a drug that is superior to Coumadin in many ways but the major drawback is that we still don't have an antidote and it is expensive. I still don't think it is the drug of choice for the majority of people until some of these factors change. He is comfortable staying on the Coumadin.    CC:. Dr. Kirby Funk; Dr. Dawayne Cirri, MD 10/8/20133:59 PM

## 2012-06-12 NOTE — Telephone Encounter (Signed)
Printed and gv pt appt schedule for OCT 2014. ° °

## 2012-06-12 NOTE — Patient Instructions (Signed)
Follow up with Dr Reece Agar in 1 year.  Call if any planned surgery to coordinate bloodthinners around procedure

## 2012-09-17 ENCOUNTER — Ambulatory Visit: Payer: 59 | Admitting: Vascular Surgery

## 2012-09-17 ENCOUNTER — Other Ambulatory Visit: Payer: 59

## 2012-09-24 ENCOUNTER — Encounter: Payer: Self-pay | Admitting: Vascular Surgery

## 2012-09-25 ENCOUNTER — Ambulatory Visit (INDEPENDENT_AMBULATORY_CARE_PROVIDER_SITE_OTHER): Payer: 59 | Admitting: Vascular Surgery

## 2012-09-25 ENCOUNTER — Encounter: Payer: Self-pay | Admitting: Vascular Surgery

## 2012-09-25 ENCOUNTER — Ambulatory Visit
Admission: RE | Admit: 2012-09-25 | Discharge: 2012-09-25 | Disposition: A | Discharge status: Home / Self Care | Payer: 59 | Source: Ambulatory Visit | Attending: Vascular Surgery | Admitting: Vascular Surgery

## 2012-09-25 VITALS — BP 124/87 | HR 55 | Ht 76.0 in | Wt 224.0 lb

## 2012-09-25 DIAGNOSIS — I7102 Dissection of abdominal aorta: Secondary | ICD-10-CM

## 2012-09-25 MED ORDER — IOHEXOL 350 MG/ML SOLN
100.0000 mL | Freq: Once | INTRAVENOUS | Status: AC | PRN
  Administered 2012-09-25: 100 mL via INTRAVENOUS

## 2012-09-25 NOTE — Progress Notes (Signed)
Subjective:     Patient ID: Don Ruiz, male   DOB: 06-16-1951, 62 y.o.   MRN: [id number]  HPI this 62 year old male returns for followup regarding his focal dissection of the infrarenal abdominal aorta. This was discovered in June of 2013 when a CT scan was performed to evaluate bilateral renal stents which have been present for many years. Patient has no history of trauma or back trauma. He had a short focal area of dissection measuring only about 2-2-1/2 cm in length and the terminal aorta with no significant pseudoaneurysm. He returns for followup. He's had no abdominal or back pain. He is able to walk and run for 5 miles.  Past Medical History  Diagnosis Date  . Pulmonary embolism   . PVC (premature ventricular contraction)   . DVT (deep venous thrombosis) 1990, 2002    chronic hypercoagulobility.   . Hyperlipidemia   . Migraine headache   . H/O tinnitus   . Ankle impingement syndrome     Right ankle from bone spur  . GERD (gastroesophageal reflux disease)   . Wears hearing aid   . Coagulopathy 06/12/2012  . Heterozygous factor V Leiden mutation 06/12/2012  . Prothrombin gene mutation 06/12/2012  . Antiphospholipid antibody syndrome 06/12/2012  . Protein S deficiency 06/12/2012  . DVT, recurrent, lower extremity, chronic 06/12/2012    1992  . Pulmonary embolism, bilateral 06/12/2012    July, 2002  . Varicose veins 06/12/2012    RLE    History  Substance Use Topics  . Smoking status: Never Smoker   . Smokeless tobacco: Never Used  . Alcohol Use: No    Family History  Problem Relation Age of Onset  . Cancer Mother     Breast cancer  . Hypertension Mother   . Diabetes Mother   . Fibromyalgia Mother   . Stroke Mother   . Stroke Father   . Heart disease Father   . Alcohol abuse Sister   . Hyperlipidemia Sister   . Hypertension Sister   . Heart disease Brother     CABG at 40  . Stroke Brother   . Pulmonary embolism Brother   . Heart disease Other   . Hyperlipidemia  Other   . Heart disease Brother     MI at 58 CABG  . Heart disease Brother     CABG,   MI at 47  . Stroke Sister 7  . Heart disease Sister   . Cancer Sister     Breast Cancer  . Other Sister     acoustic tumor  . Deep vein thrombosis Sister     Allergies  Allergen Reactions  . Niacin And Related     Hepatitis  . Phisohex (Hexachlorophene)     rash    Current outpatient prescriptions:celecoxib (CELEBREX) 200 MG capsule, Take 200 mg by mouth daily as needed. For pain, Disp: , Rfl: ;  gabapentin (NEURONTIN) 600 MG tablet, Take 300-600 mg by mouth See admin instructions. 600 in the morning and 300 at night, Disp: , Rfl: ;  Multiple Vitamin (MULITIVITAMIN WITH MINERALS) TABS, Take 1 tablet by mouth daily., Disp: , Rfl: ;  pravastatin (PRAVACHOL) 80 MG tablet, Take 80 mg by mouth daily., Disp: , Rfl:  warfarin (COUMADIN) 5 MG tablet, Take 5 mg by mouth daily., Disp: , Rfl:   BP 124/87  Pulse 55  Ht 6\' 4"  (1.93 m)  Wt 224 lb (101.606 kg)  BMI 27.27 kg/m2  SpO2 100%  Body mass index  is 27.27 kg/(m^2).          Review of Systems denies chest pain, dyspnea on exertion, PND, orthopnea, hemoptysis, claudication    Objective:   Physical Exam blood pressure 124/87 heart rate 55 respirations 16 Gen.-alert and oriented x3 in no apparent distress HEENT normal for age Lungs no rhonchi or wheezing Cardiovascular regular rhythm no murmurs carotid pulses 3+ palpable no bruits audible Abdomen soft nontender no palpable masses Musculoskeletal free of  major deformities Skin clear -no rashes Neurologic normal Lower extremities 3+ femoral and dorsalis pedis pulses palpable bilaterally with no edema  Today I ordered a CT angiogram of the abdomen and pelvis which are reviewed by computer. I compared this to the previous study performed in June of 2013. There has been no change in the short segment localized dissection in the very terminal infrarenal aorta. This represents either  localized dissection or an ulcerated atheromatous plaque. The diameter of aorta is also unchanged.      Assessment:     Stable short segment localized aortic dissection-infrarenal versus atheromatous plaque with ulceration-asymptomatic    Plan:     Will return in 9 months for repeat CT angiogram and if this is stable been perform this on an annual basis to check for any change I shared the images to the patient and explained rationale for followup with him and he understands and agrees

## 2012-09-26 NOTE — Addendum Note (Signed)
Addended by: Dannielle Karvonen on: 09/26/2012 03:23 PM   Modules accepted: Orders

## 2013-04-18 DIAGNOSIS — Z86711 Personal history of pulmonary embolism: Secondary | ICD-10-CM | POA: Insufficient documentation

## 2013-04-18 DIAGNOSIS — Z7901 Long term (current) use of anticoagulants: Secondary | ICD-10-CM

## 2013-04-18 DIAGNOSIS — I2510 Atherosclerotic heart disease of native coronary artery without angina pectoris: Secondary | ICD-10-CM | POA: Diagnosis present

## 2013-04-18 DIAGNOSIS — K219 Gastro-esophageal reflux disease without esophagitis: Secondary | ICD-10-CM | POA: Diagnosis present

## 2013-05-24 DIAGNOSIS — H905 Unspecified sensorineural hearing loss: Secondary | ICD-10-CM | POA: Diagnosis present

## 2013-05-29 ENCOUNTER — Ambulatory Visit: Payer: 59 | Admitting: Cardiovascular Disease

## 2013-06-11 ENCOUNTER — Telehealth: Payer: Self-pay | Admitting: Oncology

## 2013-06-11 ENCOUNTER — Other Ambulatory Visit (HOSPITAL_BASED_OUTPATIENT_CLINIC_OR_DEPARTMENT_OTHER): Payer: 59 | Admitting: Lab

## 2013-06-11 ENCOUNTER — Ambulatory Visit (HOSPITAL_BASED_OUTPATIENT_CLINIC_OR_DEPARTMENT_OTHER): Payer: 59 | Admitting: Oncology

## 2013-06-11 VITALS — BP 108/70 | HR 80 | Temp 97.7°F | Resp 18 | Ht 76.0 in | Wt 214.6 lb

## 2013-06-11 DIAGNOSIS — D6852 Prothrombin gene mutation: Secondary | ICD-10-CM

## 2013-06-11 DIAGNOSIS — D689 Coagulation defect, unspecified: Secondary | ICD-10-CM

## 2013-06-11 DIAGNOSIS — D6851 Activated protein C resistance: Secondary | ICD-10-CM

## 2013-06-11 DIAGNOSIS — D6861 Antiphospholipid syndrome: Secondary | ICD-10-CM

## 2013-06-11 DIAGNOSIS — Z7901 Long term (current) use of anticoagulants: Secondary | ICD-10-CM

## 2013-06-11 DIAGNOSIS — D6859 Other primary thrombophilia: Secondary | ICD-10-CM

## 2013-06-11 DIAGNOSIS — I8390 Asymptomatic varicose veins of unspecified lower extremity: Secondary | ICD-10-CM

## 2013-06-11 DIAGNOSIS — I82509 Chronic embolism and thrombosis of unspecified deep veins of unspecified lower extremity: Secondary | ICD-10-CM

## 2013-06-11 DIAGNOSIS — I2699 Other pulmonary embolism without acute cor pulmonale: Secondary | ICD-10-CM

## 2013-06-11 LAB — CBC WITH DIFFERENTIAL/PLATELET
Basophils Absolute: 0 10*3/uL (ref 0.0–0.1)
EOS%: 1.7 % (ref 0.0–7.0)
Eosinophils Absolute: 0.1 10*3/uL (ref 0.0–0.5)
HGB: 13.9 g/dL (ref 13.0–17.1)
LYMPH%: 26.9 % (ref 14.0–49.0)
MCH: 29.1 pg (ref 27.2–33.4)
MCV: 86.6 fL (ref 79.3–98.0)
MONO%: 5.3 % (ref 0.0–14.0)
NEUT#: 4.3 10*3/uL (ref 1.5–6.5)
Platelets: 246 10*3/uL (ref 140–400)
RBC: 4.78 10*6/uL (ref 4.20–5.82)

## 2013-06-11 NOTE — Telephone Encounter (Signed)
gv pt appt schedule for October 2015.

## 2013-06-12 NOTE — Progress Notes (Signed)
Hematology and Oncology Follow Up Visit  Don Ruiz [id number] [birth year] 62 y.o. 06/12/2013 8:20 PM   Principle Diagnosis: Encounter Diagnoses  Name Primary?  . Coagulopathy Yes  . Heterozygous factor V Leiden mutation   . Prothrombin gene mutation   . Antiphospholipid antibody syndrome   . Protein S deficiency   . DVT, recurrent, lower extremity, chronic, right      Interim History:   Follow-up visit for this pleasant 62 year old man with a complex coagulopathy due to factor V Leiden heterozygote status, prothrombin gene mutation heterozygote status, elevated anticardiolipin antibodies, and protein S deficiency, status post right lower extremity DVT following knee surgery in 1992, status post recurrent right lower extremity DVT, and bilateral pulmonary emboli in July 2002. He is on chronic Coumadin anticoagulation and has had no subsequent thrombotic events. He has had some surgeries over the years using a Lovenox bridge without any complications. He had a laparoscopic cholecystectomy 04/03/2007. He had ankle surgery in the past due to an Achilles tendon problem. He has had other orthopaedic problems involving his right elbow. He is followed by Dr. Jodi Geralds.  His sister had bilateral pulmonary emboli at age 24.  He recently had a radio frequency ablation procedure in September, 2014 for atrial tachycardia. Procedure done at [facility] by Dr. Orlin Hilding. In the course of having a transfemoral catheter placed he was told that he had only one layer in his right femoral artery and he is being reevaluated to place a prosthetic sleeve in the artery which is scheduled for December 10.  He has developed hearing loss and is now wearing a right hearing aid.   Medications: reviewed  Allergies:  Allergies  Allergen Reactions  . Niacin And Related     Hepatitis  . Phisohex [Hexachlorophene]     rash    Review of Systems: Constitutional:   No anorexia, weight loss, or fever HEENT no sore  throat. Sensorineural hearing deficit right ear. Respiratory: No cough or dyspnea Cardiovascular:  No chest pain or palpitations Gastrointestinal: No abdominal pain or change in bowel habit Genito-Urinary: No urinary tract symptoms Musculoskeletal: No muscle bone or joint pain. He has not been able to keep up with his full activities since Achilles tendon surgery Neurologic: No headache or change in vision Skin: No rash  Remaining ROS negative.   Physical Exam: Blood pressure 108/70, pulse 80, temperature 97.7 F (36.5 C), temperature source Oral, resp. rate 18, height 6\' 4"  (1.93 m), weight 214 lb 9.6 oz (97.342 kg). Wt Readings from Last 3 Encounters:  06/11/13 214 lb 9.6 oz (97.342 kg)  09/25/12 224 lb (101.606 kg)  06/12/12 227 lb 12.8 oz (103.329 kg)     General appearance: Well-nourished Caucasian man HENNT: Pharynx no erythema or exudate. Right hearing aid. No thyromegaly Lymph nodes: No lymphadenopathy Breasts: Lungs: Clear to auscultation resonant to percussion Heart: Regular rhythm no murmur or gallop Abdomen: Soft, nontender, no mass, no organomegaly Extremities: Right calf 43 cm left 42. Both nontender Musculoskeletal: No joint deformities GU: Vascular: No carotid bruits, no cyanosis Neurologic: Cranial nerves grossly normal, motor strength 5 over 5, reflexes 1+ symmetric Skin: No rash or ecchymosis  Lab Results: CBC W/Diff    Component Value Date/Time   WBC 6.6 06/11/2013 1412   WBC 5.6 03/08/2009 2215   RBC 4.78 06/11/2013 1412   RBC 4.37 03/08/2009 2215   HGB 13.9 06/11/2013 1412   HGB 13.6 03/08/2009 2222   HCT 41.4 06/11/2013 1412   HCT 40.0 03/08/2009  2222   PLT 246 06/11/2013 1412   PLT 207 03/08/2009 2215   MCV 86.6 06/11/2013 1412   MCV 88.6 03/08/2009 2215   MCH 29.1 06/11/2013 1412   MCHC 33.6 06/11/2013 1412   MCHC 33.6 03/08/2009 2215   RDW 13.2 06/11/2013 1412   RDW 13.2 03/08/2009 2215   LYMPHSABS 1.8 06/11/2013 1412   LYMPHSABS 2.0 04/03/2007 1227   MONOABS  0.4 06/11/2013 1412   MONOABS 0.4 04/03/2007 1227   EOSABS 0.1 06/11/2013 1412   EOSABS 0.2 04/03/2007 1227   BASOSABS 0.0 06/11/2013 1412   BASOSABS 0.0 04/03/2007 1227     Chemistry      Component Value Date/Time   NA 140 05/18/2010 1515   K 3.7 05/18/2010 1515   CL 106 05/18/2010 1515   CO2 29 05/18/2010 1515   BUN 18 05/18/2010 1515   CREATININE 1.16 05/18/2010 1515      Component Value Date/Time   CALCIUM 8.8 05/18/2010 1515   ALKPHOS 58 05/18/2010 1515   AST 27 05/18/2010 1515   ALT 27 05/18/2010 1515   BILITOT 0.8 05/18/2010 1515      Impression: #1. Complex coagulopathy due to multiple genetic defects listed above.  #2. Recurrent DVT and pulmonary emboli secondary to #1  We had our usual annual update on the new oral anticoagulants. In reviewing his med list I saw that he was on Neurontin which he has taken for about 10 years to control migraine headaches. This is one of the few drugs that interacts with the new oral anticoagulants since it is metabolized by the same enzyme system in the liver. This will lower the free drug in the blood and since we have no way to measure levels of the drug, we would not be able to determine whether his anticoagulation was adequate. This factor, in addition to the price and the fact that we still don't have a reversal agent, makes Coumadin still the preferred drug for this man.  He will need a Lovenox bridge for upcoming procedure in December. If we need to coordinate this with his surgeon at [facility] we're happy to do this. Parenthetically, the patient hates taking Lovenox shots.  Over 30 minutes spent with this patient with more than 50% with counseling   Levert Feinstein, MD 10/8/20148:20 PM

## 2013-06-25 ENCOUNTER — Ambulatory Visit: Payer: 59 | Admitting: Vascular Surgery

## 2013-06-25 ENCOUNTER — Other Ambulatory Visit: Payer: 59

## 2013-08-05 HISTORY — PX: ABDOMINAL AORTIC ANEURYSM REPAIR: SUR1152

## 2013-08-13 ENCOUNTER — Other Ambulatory Visit: Payer: Self-pay | Admitting: Gastroenterology

## 2013-09-03 ENCOUNTER — Encounter (HOSPITAL_COMMUNITY): Payer: Self-pay

## 2013-09-03 ENCOUNTER — Encounter (HOSPITAL_COMMUNITY)
Admission: RE | Disposition: A | Discharge status: Home / Self Care | Payer: Self-pay | Source: Ambulatory Visit | Attending: Gastroenterology

## 2013-09-03 ENCOUNTER — Ambulatory Visit (HOSPITAL_COMMUNITY)
Admission: RE | Admit: 2013-09-03 | Discharge: 2013-09-03 | Disposition: A | Discharge status: Home / Self Care | Payer: 59 | Source: Ambulatory Visit | Attending: Gastroenterology | Admitting: Gastroenterology

## 2013-09-03 DIAGNOSIS — K219 Gastro-esophageal reflux disease without esophagitis: Secondary | ICD-10-CM | POA: Insufficient documentation

## 2013-09-03 DIAGNOSIS — E7800 Pure hypercholesterolemia, unspecified: Secondary | ICD-10-CM | POA: Insufficient documentation

## 2013-09-03 DIAGNOSIS — D126 Benign neoplasm of colon, unspecified: Secondary | ICD-10-CM | POA: Insufficient documentation

## 2013-09-03 DIAGNOSIS — Z86711 Personal history of pulmonary embolism: Secondary | ICD-10-CM | POA: Insufficient documentation

## 2013-09-03 DIAGNOSIS — D6859 Other primary thrombophilia: Secondary | ICD-10-CM | POA: Insufficient documentation

## 2013-09-03 HISTORY — PX: COLONOSCOPY: SHX5424

## 2013-09-03 SURGERY — COLONOSCOPY
Anesthesia: Moderate Sedation

## 2013-09-03 MED ORDER — DIPHENHYDRAMINE HCL 50 MG/ML IJ SOLN
INTRAMUSCULAR | Status: AC
  Filled 2013-09-03: qty 1

## 2013-09-03 MED ORDER — MIDAZOLAM HCL 10 MG/2ML IJ SOLN
INTRAMUSCULAR | Status: AC
  Filled 2013-09-03: qty 4

## 2013-09-03 MED ORDER — SODIUM CHLORIDE 0.9 % IV SOLN
INTRAVENOUS | Status: DC
  Administered 2013-09-03: 500 mL via INTRAVENOUS

## 2013-09-03 MED ORDER — FENTANYL CITRATE 0.05 MG/ML IJ SOLN
INTRAMUSCULAR | Status: DC | PRN
  Administered 2013-09-03 (×3): 25 ug via INTRAVENOUS

## 2013-09-03 MED ORDER — FENTANYL CITRATE 0.05 MG/ML IJ SOLN
INTRAMUSCULAR | Status: AC
  Filled 2013-09-03: qty 4

## 2013-09-03 MED ORDER — MIDAZOLAM HCL 5 MG/5ML IJ SOLN
INTRAMUSCULAR | Status: DC | PRN
  Administered 2013-09-03 (×4): 2 mg via INTRAVENOUS

## 2013-09-03 NOTE — Op Note (Signed)
Problem: Personal history of adenomatous colon polyp removed on 08/18/2008. Surveillance colonoscopy scheduled today  Endoscopist: Danise Edge  Premedication: Versed 8.5 mg. Fentanyl 75 mcg  Procedure: Surveillance colonoscopy The patient was placed in the left lateral decubitus position. Anal inspection and digital rectal exam were normal. The Pentax pediatric colonoscope was introduced into the rectum and advanced to the cecum. A normal-appearing appendiceal orifice and ileocecal valve were identified. Colonic preparation for the exam today was good.  Rectum. Normal. Retroflex view of the distal rectum normal.  Sigmoid colon and descending colon. Normal  Splenic flexure. Normal  Transverse colon. Normal  Hepatic flexure. Normal  Ascending colon. Normal  Cecum and ileocecal valve. From the distal cecum, a 3 mm sessile polyp was removed with the cold biopsy forceps and a 3 mm sessile polyp was removed with the cold biopsy forceps.  Assessment: From the distal cecum, two small polyps were removed with the cold biopsy forceps; otherwise normal surveillance colonoscopy  Recommendations: Schedule surveillance colonoscopy in 5 years

## 2013-09-03 NOTE — H&P (Signed)
Procedure: Surveillance colonoscopy. Personal history of an adenomatous colon polyp removed colonoscopically in 2009.  History: The patient is a 62 year old male born [birth year]. On 08/18/2008, he underwent a colonoscopy with removal of a 4 mm adenomatous sscending colon polyp he is scheduled to undergo a surveillance colonoscopy today.  The patient chronically takes Coumadin to prevent recurrent deep venous thrombosis. He stop taking Coumadin 3 days prior to today's colonoscopy.  Allergies: Niacin. PHisoHex.  Past medical history: PVCs. Factor V Leiden. Prothrombin gene mutation with anti-cardiolipin antibodies. Pulmonary embolism in 2002. Migraine headache syndrome. Hypercholesterolemia. Chronic tinnitus. Gastroesophageal reflux. Aortic dissection. Appendectomy. Right patella repair. Ankle surgery. Cholecystectomy.  Exam: The patient is alert and lying comfortably on the endoscopy stretcher. Abdomen is soft and nontender to palpation. Lungs are clear to auscultation. Cardiac exam reveals a regular rhythm.  Plan: Proceed with surveillance colonoscopy.

## 2013-09-04 ENCOUNTER — Encounter (HOSPITAL_COMMUNITY): Payer: Self-pay | Admitting: Gastroenterology

## 2013-09-27 DIAGNOSIS — I10 Essential (primary) hypertension: Secondary | ICD-10-CM | POA: Diagnosis present

## 2013-11-04 ENCOUNTER — Encounter: Payer: Self-pay | Admitting: Oncology

## 2014-03-03 ENCOUNTER — Other Ambulatory Visit: Payer: Self-pay | Admitting: Internal Medicine

## 2014-03-03 ENCOUNTER — Ambulatory Visit
Admission: RE | Admit: 2014-03-03 | Discharge: 2014-03-03 | Disposition: A | Discharge status: Home / Self Care | Payer: 59 | Source: Ambulatory Visit | Attending: Internal Medicine | Admitting: Internal Medicine

## 2014-03-03 DIAGNOSIS — R519 Headache, unspecified: Secondary | ICD-10-CM

## 2014-03-03 DIAGNOSIS — R51 Headache: Principal | ICD-10-CM

## 2014-06-10 ENCOUNTER — Ambulatory Visit: Payer: 59 | Admitting: Oncology

## 2014-06-10 ENCOUNTER — Other Ambulatory Visit: Payer: 59

## 2014-07-24 ENCOUNTER — Other Ambulatory Visit (INDEPENDENT_AMBULATORY_CARE_PROVIDER_SITE_OTHER): Payer: Self-pay | Admitting: Surgery

## 2014-07-30 ENCOUNTER — Telehealth (INDEPENDENT_AMBULATORY_CARE_PROVIDER_SITE_OTHER): Payer: Self-pay

## 2014-07-30 NOTE — Telephone Encounter (Signed)
Dr Electa Sniff returned called to [facility] re: management of lovenox bridge for surgery. Dr Laurann Montana states he will manage pt coumadin/lovenox management but will need Dr Ninfa Linden to determine when it will be safe for pt to restart lovenox after surgery. He ask for call back if there are any questions.

## 2014-08-08 ENCOUNTER — Encounter (HOSPITAL_BASED_OUTPATIENT_CLINIC_OR_DEPARTMENT_OTHER): Payer: Self-pay | Admitting: *Deleted

## 2014-08-08 NOTE — Progress Notes (Signed)
Pt sees cardiology [facility] now-hx dvt/pe-bridging lovenox for coumadin-will come in for pt,bmet-and new ekg-last [facility] 11/14

## 2014-08-08 NOTE — Progress Notes (Signed)
   08/08/14 1140  OBSTRUCTIVE SLEEP APNEA  Have you ever been diagnosed with sleep apnea through a sleep study? No  Do you snore loudly (loud enough to be heard through closed doors)?  0  Do you often feel tired, fatigued, or sleepy during the daytime? 0  Has anyone observed you stop breathing during your sleep? 0  Do you have, or are you being treated for high blood pressure? 1  BMI more than 35 kg/m2? 0  Age over 61 years old? 1  Neck circumference greater than 40 cm/16 inches? 1  Gender: 1  Obstructive Sleep Apnea Score 4  Score 4 or greater  Results sent to PCP

## 2014-08-11 ENCOUNTER — Encounter (HOSPITAL_BASED_OUTPATIENT_CLINIC_OR_DEPARTMENT_OTHER)
Admission: RE | Admit: 2014-08-11 | Discharge: 2014-08-11 | Disposition: A | Discharge status: Home / Self Care | Payer: 59 | Source: Ambulatory Visit | Attending: Surgery | Admitting: Surgery

## 2014-08-11 DIAGNOSIS — R51 Headache: Secondary | ICD-10-CM | POA: Diagnosis present

## 2014-08-11 DIAGNOSIS — E780 Pure hypercholesterolemia: Secondary | ICD-10-CM | POA: Diagnosis not present

## 2014-08-11 DIAGNOSIS — I4891 Unspecified atrial fibrillation: Secondary | ICD-10-CM | POA: Diagnosis not present

## 2014-08-11 LAB — BASIC METABOLIC PANEL
Anion gap: 12 (ref 5–15)
BUN: 20 mg/dL (ref 6–23)
CO2: 27 meq/L (ref 19–32)
CREATININE: 1.03 mg/dL (ref 0.50–1.35)
Calcium: 9.5 mg/dL (ref 8.4–10.5)
Chloride: 101 mEq/L (ref 96–112)
GFR calc Af Amer: 89 mL/min — ABNORMAL LOW (ref >90)
GFR, EST NON AFRICAN AMERICAN: 76 mL/min — AB (ref >90)
Glucose, Bld: 120 mg/dL — ABNORMAL HIGH (ref 70–99)
Potassium: 4.3 mEq/L (ref 3.7–5.3)
SODIUM: 140 meq/L (ref 137–147)

## 2014-08-11 LAB — APTT: aPTT: 34 seconds (ref 24–37)

## 2014-08-11 LAB — PROTIME-INR
INR: 1.17 (ref 0.00–1.49)
Prothrombin Time: 15 seconds (ref 11.6–15.2)

## 2014-08-11 NOTE — H&P (Signed)
Clever Geraldo 07/24/2014 2:36 PM Location: [facility] Patient #: [id number] DOB: November 02, 1950 Married / Language: English / Race: White Male  History of Present Illness Nathaneil Canary A. Ninfa Linden MD; 07/24/2014 2:53 PM) Patient words: Temp Art Biopsy.  The patient is a 63 year old male who presents with a complaint of headache. This gentleman is referred by Dr. Lavone Orn for left-sided headaches and possible temporal arteritis. Temporal artery biopsy has been requested. The patient is on chronic Coumadin for bleeding disorder. He is currently on steroids relieves his headache   Other Problems Festus Holts, LPN; 85/88/5027 7:41 PM) Atrial Fibrillation High blood pressure Hypercholesterolemia Migraine Headache Pulmonary Embolism / Blood Clot in Legs  Past Surgical History Festus Holts, LPN; 28/78/6767 2:09 PM) Aneurysm Repair Appendectomy Colon Polyp Removal - Colonoscopy Foot Surgery Right. Knee Surgery Right. Vasectomy  Diagnostic Studies History Festus Holts, LPN; 47/05/6282 6:62 PM) Colonoscopy within last year  Allergies Festus Holts, LPN; 94/76/5465 0:35 PM) Niacin (Antihyperlipidemic) *ANTIHYPERLIPIDEMICS* Topamax *ANTICONVULSANTS*  Medication History Festus Holts, LPN; 46/56/8127 5:17 PM) Celecoxib (200MG  Capsule, Oral) Active. Gabapentin (600MG  Tablet, Oral) Active. Ramipril (10MG  Capsule, Oral) Active. PredniSONE (20MG  Tablet, Oral) Active. Pravastatin Sodium (80MG  Tablet, Oral) Active. Warfarin Sodium (5MG  Tablet, Oral) Active.  Social History Festus Holts, LPN; 00/17/4944 9:67 PM) Caffeine use Carbonated beverages, Tea. No alcohol use No drug use Tobacco use Never smoker.  Family History Festus Holts, LPN; 59/16/3846 6:59 PM) Breast Cancer Mother, Sister. Cerebrovascular Accident Brother, Sister. Diabetes Mellitus Mother, Sister. Heart Disease Brother, Father, Sister. Hypertension Brother,  Mother, Sister. Migraine Headache Daughter, Father. Prostate Cancer Brother.  Review of Systems Festus Holts LPN; 93/57/0177 9:39 PM) General Not Present- Appetite Loss, Chills, Fatigue, Fever, Night Sweats, Weight Gain and Weight Loss. Skin Not Present- Change in Wart/Mole, Dryness, Hives, Jaundice, New Lesions, Non-Healing Wounds, Rash and Ulcer. HEENT Present- Hearing Loss, Visual Disturbances and Wears glasses/contact lenses. Not Present- Earache, Hoarseness, Nose Bleed, Oral Ulcers, Ringing in the Ears, Seasonal Allergies, Sinus Pain, Sore Throat and Yellow Eyes. Respiratory Not Present- Bloody sputum, Chronic Cough, Difficulty Breathing, Snoring and Wheezing. Breast Not Present- Breast Mass, Breast Pain, Nipple Discharge and Skin Changes. Cardiovascular Present- Leg Cramps. Not Present- Chest Pain, Difficulty Breathing Lying Down, Palpitations, Rapid Heart Rate, Shortness of Breath and Swelling of Extremities. Gastrointestinal Not Present- Abdominal Pain, Bloating, Bloody Stool, Change in Bowel Habits, Chronic diarrhea, Constipation, Difficulty Swallowing, Excessive gas, Gets full quickly at meals, Hemorrhoids, Indigestion, Nausea, Rectal Pain and Vomiting. Male Genitourinary Not Present- Blood in Urine, Change in Urinary Stream, Frequency, Impotence, Nocturia, Painful Urination, Urgency and Urine Leakage. Musculoskeletal Not Present- Back Pain, Joint Pain, Joint Stiffness, Muscle Pain, Muscle Weakness and Swelling of Extremities. Neurological Present- Headaches. Not Present- Decreased Memory, Fainting, Numbness, Seizures, Tingling, Tremor, Trouble walking and Weakness. Psychiatric Not Present- Anxiety, Bipolar, Change in Sleep Pattern, Depression, Fearful and Frequent crying. Endocrine Not Present- Cold Intolerance, Excessive Hunger, Hair Changes, Heat Intolerance, Hot flashes and New Diabetes. Hematology Not Present- Easy Bruising, Excessive bleeding, Gland problems, HIV and Persistent  Infections.   Vitals Festus Holts LPN; 03/00/9233 0:07 PM) 07/24/2014 2:38 PM Weight: 207.38 lb Height: 76in Body Surface Area: 2.25 m Body Mass Index: 25.24 kg/m Temp.: 98.56F(Temporal)  Pulse: 84 (Regular)  Resp.: 22 (Unlabored)  BP: 130/82 (Sitting, Left Arm, Standard)    Physical Exam (Gayle Collard A. Ninfa Linden MD; 07/24/2014 2:54 PM) The physical exam findings are as follows: Note:There is an easily palpable left temporal artery. He is well appearance    Assessment &  Plan (Bud Kaeser A. Ninfa Linden MD; 07/24/2014 2:54 PM) TEMPORAL ARTERITIS (446.5  M31.6) Impression: He will be scheduled for temporal artery biopsy. He will have to be off his Coumadin for 3 days and bridged with Lovenox preoperatively. I discussed the risks which includes bleeding and infection. He understands and wishes to proceed with surgery which will be scheduled

## 2014-08-12 ENCOUNTER — Ambulatory Visit (HOSPITAL_BASED_OUTPATIENT_CLINIC_OR_DEPARTMENT_OTHER): Payer: 59 | Admitting: Certified Registered"

## 2014-08-12 ENCOUNTER — Ambulatory Visit (HOSPITAL_BASED_OUTPATIENT_CLINIC_OR_DEPARTMENT_OTHER)
Admission: RE | Admit: 2014-08-12 | Discharge: 2014-08-12 | Disposition: A | Discharge status: Home / Self Care | Payer: 59 | Source: Ambulatory Visit | Attending: Surgery | Admitting: Surgery

## 2014-08-12 ENCOUNTER — Encounter (HOSPITAL_BASED_OUTPATIENT_CLINIC_OR_DEPARTMENT_OTHER): Payer: Self-pay | Admitting: Certified Registered"

## 2014-08-12 ENCOUNTER — Encounter (HOSPITAL_BASED_OUTPATIENT_CLINIC_OR_DEPARTMENT_OTHER)
Admission: RE | Disposition: A | Discharge status: Home / Self Care | Payer: Self-pay | Source: Ambulatory Visit | Attending: Surgery

## 2014-08-12 DIAGNOSIS — R51 Headache: Secondary | ICD-10-CM | POA: Diagnosis not present

## 2014-08-12 DIAGNOSIS — E780 Pure hypercholesterolemia: Secondary | ICD-10-CM | POA: Insufficient documentation

## 2014-08-12 DIAGNOSIS — I4891 Unspecified atrial fibrillation: Secondary | ICD-10-CM | POA: Insufficient documentation

## 2014-08-12 HISTORY — PX: ARTERY BIOPSY: SHX891

## 2014-08-12 HISTORY — DX: Cardiac arrhythmia, unspecified: I49.9

## 2014-08-12 LAB — POCT HEMOGLOBIN-HEMACUE: HEMOGLOBIN: 14 g/dL (ref 13.0–17.0)

## 2014-08-12 SURGERY — BIOPSY TEMPORAL ARTERY
Anesthesia: Monitor Anesthesia Care | Site: Face | Laterality: Left

## 2014-08-12 MED ORDER — MIDAZOLAM HCL 2 MG/2ML IJ SOLN
INTRAMUSCULAR | Status: AC
  Filled 2014-08-12: qty 2

## 2014-08-12 MED ORDER — MIDAZOLAM HCL 2 MG/2ML IJ SOLN: 1.0000 mg | INTRAMUSCULAR | Status: DC | PRN

## 2014-08-12 MED ORDER — LIDOCAINE HCL (PF) 1 % IJ SOLN
INTRAMUSCULAR | Status: AC
  Filled 2014-08-12: qty 30

## 2014-08-12 MED ORDER — PROPOFOL INFUSION 10 MG/ML OPTIME
INTRAVENOUS | Status: DC | PRN
  Administered 2014-08-12: 75 ug/kg/min via INTRAVENOUS

## 2014-08-12 MED ORDER — ONDANSETRON HCL 4 MG/2ML IJ SOLN
INTRAMUSCULAR | Status: DC | PRN
  Administered 2014-08-12: 4 mg via INTRAVENOUS

## 2014-08-12 MED ORDER — MIDAZOLAM HCL 5 MG/5ML IJ SOLN
INTRAMUSCULAR | Status: DC | PRN
  Administered 2014-08-12: 1 mg via INTRAVENOUS

## 2014-08-12 MED ORDER — CEFAZOLIN SODIUM-DEXTROSE 2-3 GM-% IV SOLR
INTRAVENOUS | Status: AC
  Filled 2014-08-12: qty 50

## 2014-08-12 MED ORDER — HYDROCODONE-ACETAMINOPHEN 5-325 MG PO TABS: 1.0000 | ORAL_TABLET | ORAL | Status: DC | PRN

## 2014-08-12 MED ORDER — FENTANYL CITRATE 0.05 MG/ML IJ SOLN
INTRAMUSCULAR | Status: DC | PRN
  Administered 2014-08-12: 50 ug via INTRAVENOUS

## 2014-08-12 MED ORDER — OXYCODONE HCL 5 MG PO TABS
5.0000 mg | ORAL_TABLET | Freq: Once | ORAL | Status: AC
  Administered 2014-08-12: 5 mg via ORAL

## 2014-08-12 MED ORDER — LIDOCAINE HCL (CARDIAC) 20 MG/ML IV SOLN
INTRAVENOUS | Status: DC | PRN
  Administered 2014-08-12: 60 mg via INTRAVENOUS

## 2014-08-12 MED ORDER — FENTANYL CITRATE 0.05 MG/ML IJ SOLN: 25.0000 ug | INTRAMUSCULAR | Status: DC | PRN

## 2014-08-12 MED ORDER — LIDOCAINE-EPINEPHRINE 1 %-1:100000 IJ SOLN
INTRAMUSCULAR | Status: DC | PRN
  Administered 2014-08-12: 7 mL

## 2014-08-12 MED ORDER — CEFAZOLIN SODIUM-DEXTROSE 2-3 GM-% IV SOLR
2.0000 g | INTRAVENOUS | Status: AC
  Administered 2014-08-12: 2 g via INTRAVENOUS

## 2014-08-12 MED ORDER — LIDOCAINE-EPINEPHRINE 1 %-1:100000 IJ SOLN
INTRAMUSCULAR | Status: AC
  Filled 2014-08-12: qty 1

## 2014-08-12 MED ORDER — OXYCODONE HCL 5 MG PO TABS
ORAL_TABLET | ORAL | Status: AC
  Filled 2014-08-12: qty 1

## 2014-08-12 MED ORDER — FENTANYL CITRATE 0.05 MG/ML IJ SOLN: 50.0000 ug | INTRAMUSCULAR | Status: DC | PRN

## 2014-08-12 MED ORDER — SODIUM BICARBONATE 4 % IV SOLN
INTRAVENOUS | Status: AC
  Filled 2014-08-12: qty 5

## 2014-08-12 MED ORDER — LACTATED RINGERS IV SOLN
INTRAVENOUS | Status: DC
  Administered 2014-08-12: 09:00:00 via INTRAVENOUS

## 2014-08-12 MED ORDER — BUPIVACAINE-EPINEPHRINE (PF) 0.5% -1:200000 IJ SOLN
INTRAMUSCULAR | Status: AC
  Filled 2014-08-12: qty 30

## 2014-08-12 MED ORDER — ONDANSETRON HCL 4 MG/2ML IJ SOLN: 4.0000 mg | Freq: Once | INTRAMUSCULAR | Status: DC | PRN

## 2014-08-12 MED ORDER — FENTANYL CITRATE 0.05 MG/ML IJ SOLN
INTRAMUSCULAR | Status: AC
  Filled 2014-08-12: qty 2

## 2014-08-12 SURGICAL SUPPLY — 42 items
APL SKNCLS STERI-STRIP NONHPOA (GAUZE/BANDAGES/DRESSINGS) ×1
BENZOIN TINCTURE PRP APPL 2/3 (GAUZE/BANDAGES/DRESSINGS) ×3 IMPLANT
BLADE CLIPPER SURG (BLADE) ×2 IMPLANT
BLADE SURG 15 STRL LF DISP TIS (BLADE) ×1 IMPLANT
BLADE SURG 15 STRL SS (BLADE) ×3
CANISTER SUCT 1200ML W/VALVE (MISCELLANEOUS) IMPLANT
CLOSURE WOUND 1/2 X4 (GAUZE/BANDAGES/DRESSINGS)
COVER BACK TABLE 60X90IN (DRAPES) ×3 IMPLANT
COVER MAYO STAND STRL (DRAPES) ×3 IMPLANT
DECANTER SPIKE VIAL GLASS SM (MISCELLANEOUS) IMPLANT
DRAPE PED LAPAROTOMY (DRAPES) ×3 IMPLANT
DRAPE UTILITY XL STRL (DRAPES) ×3 IMPLANT
ELECT REM PT RETURN 9FT ADLT (ELECTROSURGICAL) ×3
ELECTRODE REM PT RTRN 9FT ADLT (ELECTROSURGICAL) ×1 IMPLANT
GLOVE BIOGEL PI IND STRL 6.5 (GLOVE) IMPLANT
GLOVE BIOGEL PI INDICATOR 6.5 (GLOVE) ×2
GLOVE ECLIPSE 6.5 STRL STRAW (GLOVE) ×2 IMPLANT
GLOVE SURG SIGNA 7.5 PF LTX (GLOVE) ×3 IMPLANT
GOWN STRL REUS W/ TWL LRG LVL3 (GOWN DISPOSABLE) ×1 IMPLANT
GOWN STRL REUS W/ TWL XL LVL3 (GOWN DISPOSABLE) ×1 IMPLANT
GOWN STRL REUS W/TWL LRG LVL3 (GOWN DISPOSABLE) ×3
GOWN STRL REUS W/TWL XL LVL3 (GOWN DISPOSABLE) ×3
LIQUID BAND (GAUZE/BANDAGES/DRESSINGS) ×2 IMPLANT
NDL HYPO 25X1 1.5 SAFETY (NEEDLE) ×1 IMPLANT
NEEDLE HYPO 25X1 1.5 SAFETY (NEEDLE) ×3 IMPLANT
NS IRRIG 1000ML POUR BTL (IV SOLUTION) ×1 IMPLANT
PACK BASIN DAY SURGERY FS (CUSTOM PROCEDURE TRAY) ×3 IMPLANT
PENCIL BUTTON HOLSTER BLD 10FT (ELECTRODE) ×3 IMPLANT
SLEEVE SCD COMPRESS KNEE MED (MISCELLANEOUS) ×2 IMPLANT
STRIP CLOSURE SKIN 1/2X4 (GAUZE/BANDAGES/DRESSINGS) ×1 IMPLANT
SUT MNCRL AB 4-0 PS2 18 (SUTURE) ×3 IMPLANT
SUT SILK 3 0 TIES 17X18 (SUTURE) ×3
SUT SILK 3-0 18XBRD TIE BLK (SUTURE) ×1 IMPLANT
SUT VIC AB 3-0 SH 27 (SUTURE) ×3
SUT VIC AB 3-0 SH 27X BRD (SUTURE) IMPLANT
SYR CONTROL 10ML LL (SYRINGE) ×3 IMPLANT
TOWEL OR 17X24 6PK STRL BLUE (TOWEL DISPOSABLE) ×1 IMPLANT
TOWEL OR NON WOVEN STRL DISP B (DISPOSABLE) ×3 IMPLANT
TRAY DSU PREP LF (CUSTOM PROCEDURE TRAY) ×2 IMPLANT
TUBE CONNECTING 20'X1/4 (TUBING)
TUBE CONNECTING 20X1/4 (TUBING) IMPLANT
YANKAUER SUCT BULB TIP NO VENT (SUCTIONS) IMPLANT

## 2014-08-12 NOTE — Anesthesia Procedure Notes (Signed)
Procedure Name: MAC Date/Time: 08/12/2014 9:40 AM Performed by: Belinda Schlichting Pre-anesthesia Checklist: Patient identified, Emergency Drugs available, Suction available, Patient being monitored and Timeout performed Patient Re-evaluated:Patient Re-evaluated prior to inductionOxygen Delivery Method: Simple face mask

## 2014-08-12 NOTE — Transfer of Care (Signed)
Immediate Anesthesia Transfer of Care Note  Patient: Don Ruiz  Procedure(s) Performed: Procedure(s): LEFT TEMPORAL ARTERY BIOPSY  (Left)  Patient Location: PACU  Anesthesia Type:General  Level of Consciousness: awake, alert , oriented and patient cooperative  Airway & Oxygen Therapy: Patient Spontanous Breathing and Patient connected to face mask oxygen  Post-op Assessment: Report given to PACU RN and Post -op Vital signs reviewed and stable  Post vital signs: Reviewed and stable  Complications: No apparent anesthesia complications

## 2014-08-12 NOTE — Op Note (Signed)
LEFT TEMPORAL ARTERY BIOPSY   Procedure Note  Shigeru Lampert 08/12/2014   Pre-op Diagnosis: Left Side Headaches     Post-op Diagnosis: same  Procedure(s): LEFT TEMPORAL ARTERY BIOPSY   Surgeon(s): Coralie Keens, MD  Anesthesia: Monitor Anesthesia Care  Staff:  Circulator: Eli Phillips, RN Scrub Person: Glenna Fellows, RN  Estimated Blood Loss: Minimal               Specimens: sent to path          [facility]   Date: 08/12/2014  Time: 10:15 AM

## 2014-08-12 NOTE — Anesthesia Preprocedure Evaluation (Signed)
Anesthesia Evaluation  Patient identified by MRN, date of birth, ID band Patient awake    Reviewed: Allergy & Precautions, H&P , NPO status , Patient's Chart, lab work & pertinent test results  Airway Mallampati: I  TM Distance: >3 FB Neck ROM: Full    Dental  (+) Teeth Intact, Dental Advisory Given   Pulmonary  breath sounds clear to auscultation        Cardiovascular + Peripheral Vascular Disease + dysrhythmias (Pt had ablation for "tachycardia" at [facility] several years ago with good results) Rhythm:Regular Rate:Normal     Neuro/Psych    GI/Hepatic GERD-  Medicated and Controlled,  Endo/Other    Renal/GU      Musculoskeletal   Abdominal   Peds  Hematology   Anesthesia Other Findings   Reproductive/Obstetrics                             Anesthesia Physical Anesthesia Plan  ASA: II  Anesthesia Plan: MAC   Post-op Pain Management:    Induction: Intravenous  Airway Management Planned: Simple Face Mask  Additional Equipment:   Intra-op Plan:   Post-operative Plan:   Informed Consent: I have reviewed the patients History and Physical, chart, labs and discussed the procedure including the risks, benefits and alternatives for the proposed anesthesia with the patient or authorized representative who has indicated his/her understanding and acceptance.   Dental advisory given  Plan Discussed with: CRNA, Anesthesiologist and Surgeon  Anesthesia Plan Comments:         Anesthesia Quick Evaluation

## 2014-08-12 NOTE — Discharge Instructions (Signed)
Ok to shower starting tomorrow ° ° °Post Anesthesia Home Care Instructions ° °Activity: °Get plenty of rest for the remainder of the day. A responsible adult should stay with you for 24 hours following the procedure.  °For the next 24 hours, DO NOT: °-Drive a car °-Operate machinery °-Drink alcoholic beverages °-Take any medication unless instructed by your physician °-Make any legal decisions or sign important papers. ° °Meals: °Start with liquid foods such as gelatin or soup. Progress to regular foods as tolerated. Avoid greasy, spicy, heavy foods. If nausea and/or vomiting occur, drink only clear liquids until the nausea and/or vomiting subsides. Call your physician if vomiting continues. ° °Special Instructions/Symptoms: °Your throat may feel dry or sore from the anesthesia or the breathing tube placed in your throat during surgery. If this causes discomfort, gargle with warm salt water. The discomfort should disappear within 24 hours. ° °

## 2014-08-12 NOTE — Anesthesia Postprocedure Evaluation (Signed)
  Anesthesia Post-op Note  Patient: Don Ruiz  Procedure(s) Performed: Procedure(s): LEFT TEMPORAL ARTERY BIOPSY  (Left)  Patient Location: PACU  Anesthesia Type: MAC   Level of Consciousness: awake, alert  and oriented  Airway and Oxygen Therapy: Patient Spontanous Breathing  Post-op Pain: mild  Post-op Assessment: Post-op Vital signs reviewed  Post-op Vital Signs: Reviewed  Last Vitals:  Filed Vitals:   08/12/14 1130  BP: 151/89  Pulse: 66  Temp: 36.7 C  Resp: 18    Complications: No apparent anesthesia complications

## 2014-08-12 NOTE — Interval H&P Note (Signed)
History and Physical Interval Note: no change in H and P  08/12/2014 9:17 AM  Don Ruiz  has presented today for surgery, with the diagnosis of Left Side Headaches  The various methods of treatment have been discussed with the patient and family. After consideration of risks, benefits and other options for treatment, the patient has consented to  Procedure(s): LEFT TEMPORAL ARTERY BIOPSY  (Left) as a surgical intervention .  The patient's history has been reviewed, patient examined, no change in status, stable for surgery.  I have reviewed the patient's chart and labs.  Questions were answered to the patient's satisfaction.     Shylyn Younce A

## 2014-08-13 ENCOUNTER — Encounter (HOSPITAL_BASED_OUTPATIENT_CLINIC_OR_DEPARTMENT_OTHER): Payer: Self-pay | Admitting: Surgery

## 2014-08-15 NOTE — Op Note (Signed)
NAME:  GARET, HOOTON NO.:  [id number]  MEDICAL RECORD NO.:  [id number]  LOCATION:                                 FACILITY:  PHYSICIAN:  Coralie Keens, M.D. DATE OF BIRTH:  05-11-1951  DATE OF PROCEDURE:  08/12/2014 DATE OF DISCHARGE:  08/12/2014                              OPERATIVE REPORT   PREOPERATIVE DIAGNOSIS:  Left-sided headaches.  POSTOPERATIVE DIAGNOSIS:  Left-sided headaches.  PROCEDURE:  Left temporal artery biopsy.  SURGEON:  Coralie Keens, M.D.  ANESTHESIA:  1% lidocaine with epinephrine and monitored anesthesia care.  ESTIMATED BLOOD LOSS:  Minimal.  PROCEDURE IN DETAIL:  The patient was brought to the operating room, identified as Don Ruiz.  He was placed supine on the operating room table and anesthesia was induced.  His left temporal area was then prepped and draped in usual sterile fashion.  I anesthetized skin from the area with lidocaine with epinephrine, then made a small longitudinal incision with a scalpel.  I took this down through the fascia with electrocautery.  The temporal artery was easily identified.  I dissected out for several centimeters length proximally and distally and then clamped it with clamps proximally and distally and transected it with a scalpel.  I then sent the specimen to Pathology for evaluation.  I then tied off the ends with 3-0 silk sutures.  I then anesthetized the wound further with lidocaine.  I then closed subcutaneous tissue with interrupted 3-0 Vicryl sutures and then, closed the skin with running 4- 0 Monocryl.  Dermabond was then applied.  The patient tolerated the procedure well.  All the counts were correct at the end of the procedure.  The patient was then taken in stable condition from the operating room to recovery.     Coralie Keens, M.D.     DB/MEDQ  D:  08/12/2014  T:  08/12/2014  Job:  [id number]

## 2017-02-04 ENCOUNTER — Encounter (HOSPITAL_COMMUNITY): Payer: Self-pay | Admitting: Emergency Medicine

## 2017-02-04 ENCOUNTER — Emergency Department (HOSPITAL_COMMUNITY): Payer: 59

## 2017-02-04 ENCOUNTER — Observation Stay (HOSPITAL_COMMUNITY)
Admission: EM | Admit: 2017-02-04 | Discharge: 2017-02-05 | Disposition: A | Discharge status: Home / Self Care | Payer: 59 | Attending: Family Medicine | Admitting: Family Medicine

## 2017-02-04 DIAGNOSIS — I10 Essential (primary) hypertension: Secondary | ICD-10-CM | POA: Diagnosis not present

## 2017-02-04 DIAGNOSIS — Z86718 Personal history of other venous thrombosis and embolism: Secondary | ICD-10-CM | POA: Diagnosis not present

## 2017-02-04 DIAGNOSIS — E860 Dehydration: Secondary | ICD-10-CM | POA: Insufficient documentation

## 2017-02-04 DIAGNOSIS — I7102 Dissection of abdominal aorta: Secondary | ICD-10-CM | POA: Diagnosis present

## 2017-02-04 DIAGNOSIS — Z86711 Personal history of pulmonary embolism: Secondary | ICD-10-CM | POA: Diagnosis not present

## 2017-02-04 DIAGNOSIS — H905 Unspecified sensorineural hearing loss: Secondary | ICD-10-CM | POA: Insufficient documentation

## 2017-02-04 DIAGNOSIS — I2510 Atherosclerotic heart disease of native coronary artery without angina pectoris: Secondary | ICD-10-CM | POA: Diagnosis not present

## 2017-02-04 DIAGNOSIS — Z7952 Long term (current) use of systemic steroids: Secondary | ICD-10-CM | POA: Diagnosis not present

## 2017-02-04 DIAGNOSIS — R7989 Other specified abnormal findings of blood chemistry: Secondary | ICD-10-CM | POA: Diagnosis not present

## 2017-02-04 DIAGNOSIS — D6851 Activated protein C resistance: Secondary | ICD-10-CM | POA: Diagnosis not present

## 2017-02-04 DIAGNOSIS — Z7901 Long term (current) use of anticoagulants: Secondary | ICD-10-CM | POA: Diagnosis not present

## 2017-02-04 DIAGNOSIS — Z79899 Other long term (current) drug therapy: Secondary | ICD-10-CM | POA: Insufficient documentation

## 2017-02-04 DIAGNOSIS — Z9889 Other specified postprocedural states: Secondary | ICD-10-CM | POA: Insufficient documentation

## 2017-02-04 DIAGNOSIS — R1013 Epigastric pain: Principal | ICD-10-CM | POA: Insufficient documentation

## 2017-02-04 DIAGNOSIS — N179 Acute kidney failure, unspecified: Secondary | ICD-10-CM | POA: Insufficient documentation

## 2017-02-04 DIAGNOSIS — K219 Gastro-esophageal reflux disease without esophagitis: Secondary | ICD-10-CM | POA: Diagnosis present

## 2017-02-04 DIAGNOSIS — E785 Hyperlipidemia, unspecified: Secondary | ICD-10-CM | POA: Insufficient documentation

## 2017-02-04 DIAGNOSIS — K759 Inflammatory liver disease, unspecified: Secondary | ICD-10-CM

## 2017-02-04 DIAGNOSIS — D6859 Other primary thrombophilia: Secondary | ICD-10-CM | POA: Diagnosis present

## 2017-02-04 DIAGNOSIS — I951 Orthostatic hypotension: Secondary | ICD-10-CM | POA: Insufficient documentation

## 2017-02-04 DIAGNOSIS — R945 Abnormal results of liver function studies: Secondary | ICD-10-CM

## 2017-02-04 LAB — COMPREHENSIVE METABOLIC PANEL
ALT: 347 U/L — ABNORMAL HIGH (ref 17–63)
ANION GAP: 7 (ref 5–15)
AST: 426 U/L — ABNORMAL HIGH (ref 15–41)
Albumin: 4.2 g/dL (ref 3.5–5.0)
Alkaline Phosphatase: 111 U/L (ref 38–126)
BILIRUBIN TOTAL: 0.9 mg/dL (ref 0.3–1.2)
BUN: 17 mg/dL (ref 6–20)
CO2: 27 mmol/L (ref 22–32)
Calcium: 9.8 mg/dL (ref 8.9–10.3)
Chloride: 104 mmol/L (ref 101–111)
Creatinine, Ser: 1.29 mg/dL — ABNORMAL HIGH (ref 0.61–1.24)
GFR calc Af Amer: >60 mL/min (ref >60)
GFR, EST NON AFRICAN AMERICAN: 57 mL/min — AB (ref >60)
Glucose, Bld: 114 mg/dL — ABNORMAL HIGH (ref 65–99)
Potassium: 4.1 mmol/L (ref 3.5–5.1)
Sodium: 138 mmol/L (ref 135–145)
TOTAL PROTEIN: 7.3 g/dL (ref 6.5–8.1)

## 2017-02-04 LAB — URINALYSIS, ROUTINE W REFLEX MICROSCOPIC
BILIRUBIN URINE: NEGATIVE
Glucose, UA: NEGATIVE mg/dL
HGB URINE DIPSTICK: NEGATIVE
KETONES UR: NEGATIVE mg/dL
Leukocytes, UA: NEGATIVE
Nitrite: NEGATIVE
Protein, ur: NEGATIVE mg/dL
Specific Gravity, Urine: 1.021 (ref 1.005–1.030)
pH: 5 (ref 5.0–8.0)

## 2017-02-04 LAB — PROTIME-INR
INR: 2.09
Prothrombin Time: 23.8 seconds — ABNORMAL HIGH (ref 11.4–15.2)

## 2017-02-04 LAB — CBC
HCT: 44.9 % (ref 39.0–52.0)
Hemoglobin: 14.7 g/dL (ref 13.0–17.0)
MCH: 28.3 pg (ref 26.0–34.0)
MCHC: 32.7 g/dL (ref 30.0–36.0)
MCV: 86.5 fL (ref 78.0–100.0)
PLATELETS: 145 10*3/uL — AB (ref 150–400)
RBC: 5.19 MIL/uL (ref 4.22–5.81)
RDW: 14.1 % (ref 11.5–15.5)
WBC: 8.8 10*3/uL (ref 4.0–10.5)

## 2017-02-04 LAB — I-STAT TROPONIN, ED
Troponin i, poc: 0 ng/mL (ref 0.00–0.08)
Troponin i, poc: 0 ng/mL (ref 0.00–0.08)

## 2017-02-04 LAB — LIPASE, BLOOD: LIPASE: 37 U/L (ref 11–51)

## 2017-02-04 LAB — CBG MONITORING, ED: Glucose-Capillary: 98 mg/dL (ref 65–99)

## 2017-02-04 LAB — ACETAMINOPHEN LEVEL

## 2017-02-04 MED ORDER — GABAPENTIN 600 MG PO TABS
600.0000 mg | ORAL_TABLET | Freq: Every day | ORAL | Status: DC
  Administered 2017-02-05: 600 mg via ORAL
  Filled 2017-02-04: qty 1

## 2017-02-04 MED ORDER — HYDROCODONE-ACETAMINOPHEN 5-325 MG PO TABS
1.0000 | ORAL_TABLET | ORAL | Status: DC | PRN
  Administered 2017-02-04: 1 via ORAL
  Administered 2017-02-05: 2 via ORAL
  Filled 2017-02-04: qty 1
  Filled 2017-02-04: qty 2
  Filled 2017-02-04: qty 1

## 2017-02-04 MED ORDER — ACETAMINOPHEN 650 MG RE SUPP: 650.0000 mg | Freq: Four times a day (QID) | RECTAL | Status: DC | PRN

## 2017-02-04 MED ORDER — SODIUM CHLORIDE 0.9 % IV BOLUS (SEPSIS)
500.0000 mL | Freq: Once | INTRAVENOUS | Status: AC
  Administered 2017-02-04: 500 mL via INTRAVENOUS

## 2017-02-04 MED ORDER — ONDANSETRON HCL 4 MG/2ML IJ SOLN: 4.0000 mg | Freq: Four times a day (QID) | INTRAMUSCULAR | Status: DC | PRN

## 2017-02-04 MED ORDER — TRAZODONE HCL 50 MG PO TABS: 25.0000 mg | ORAL_TABLET | Freq: Every evening | ORAL | Status: DC | PRN

## 2017-02-04 MED ORDER — BISACODYL 10 MG RE SUPP: 10.0000 mg | Freq: Every day | RECTAL | Status: DC | PRN

## 2017-02-04 MED ORDER — SODIUM CHLORIDE 0.9 % IV SOLN
INTRAVENOUS | Status: DC
  Administered 2017-02-04 – 2017-02-05 (×3): via INTRAVENOUS

## 2017-02-04 MED ORDER — SODIUM CHLORIDE 0.9% FLUSH
3.0000 mL | Freq: Two times a day (BID) | INTRAVENOUS | Status: DC
  Administered 2017-02-05: 3 mL via INTRAVENOUS

## 2017-02-04 MED ORDER — WARFARIN SODIUM 5 MG PO TABS
5.0000 mg | ORAL_TABLET | Freq: Every day | ORAL | Status: DC
  Administered 2017-02-04: 5 mg via ORAL
  Filled 2017-02-04: qty 1

## 2017-02-04 MED ORDER — PRAVASTATIN SODIUM 40 MG PO TABS
80.0000 mg | ORAL_TABLET | Freq: Every day | ORAL | Status: DC
  Administered 2017-02-04: 80 mg via ORAL
  Filled 2017-02-04: qty 2

## 2017-02-04 MED ORDER — GI COCKTAIL ~~LOC~~: 30.0000 mL | Freq: Three times a day (TID) | ORAL | Status: DC | PRN

## 2017-02-04 MED ORDER — EZETIMIBE 10 MG PO TABS
10.0000 mg | ORAL_TABLET | Freq: Every day | ORAL | Status: DC
  Administered 2017-02-05: 10 mg via ORAL
  Filled 2017-02-04: qty 1

## 2017-02-04 MED ORDER — GI COCKTAIL ~~LOC~~
30.0000 mL | Freq: Once | ORAL | Status: AC
  Administered 2017-02-04: 30 mL via ORAL
  Filled 2017-02-04: qty 30

## 2017-02-04 MED ORDER — HYDROCODONE-ACETAMINOPHEN 5-325 MG PO TABS
1.0000 | ORAL_TABLET | Freq: Once | ORAL | Status: AC
  Administered 2017-02-04: 1 via ORAL
  Filled 2017-02-04: qty 1

## 2017-02-04 MED ORDER — ACETAMINOPHEN 325 MG PO TABS: 650.0000 mg | ORAL_TABLET | Freq: Four times a day (QID) | ORAL | Status: DC | PRN

## 2017-02-04 MED ORDER — ONDANSETRON HCL 4 MG PO TABS: 4.0000 mg | ORAL_TABLET | Freq: Four times a day (QID) | ORAL | Status: DC | PRN

## 2017-02-04 MED ORDER — SENNOSIDES-DOCUSATE SODIUM 8.6-50 MG PO TABS: 1.0000 | ORAL_TABLET | Freq: Every evening | ORAL | Status: DC | PRN

## 2017-02-04 MED ORDER — GABAPENTIN 300 MG PO CAPS
300.0000 mg | ORAL_CAPSULE | Freq: Every day | ORAL | Status: DC
  Administered 2017-02-04: 300 mg via ORAL
  Filled 2017-02-04: qty 1

## 2017-02-04 MED ORDER — WARFARIN - PHYSICIAN DOSING INPATIENT
Freq: Every day | Status: DC
  Administered 2017-02-04: 17:00:00

## 2017-02-04 NOTE — ED Triage Notes (Signed)
Pt. Stated, I had arthroscopic  Surgery yesterday and last night I started having nausea, with weakness and especially indigestion. I took a Vicodin every 6 hours.  Only ate some crackers.

## 2017-02-04 NOTE — ED Provider Notes (Signed)
[facility] DEPT Provider Note   CSN: [id number] Arrival date & time: 02/04/17  1141     History   Chief Complaint Chief Complaint  Patient presents with  . Gastroesophageal Reflux  . Nausea  . Dizziness    HPI Don Ruiz is a 66 y.o. male.  Patient is a 66 year old male with a history of prior DVT/PE on Coumadin, hyperlipidemia, protein S deficiency, AAA s/p repair who presents with epigastric pain. He had an arthroscopic pick surgery of his left knee yesterday. He states after he got home he was doing okay but about 7:00 last night he started having intense discomfort in his epigastric region. It was nonradiating. He did feel clammy and lightheaded with the symptoms. There is no shortness of breath. It did seem to control worse when he was ambulating with his crutches. No back pain. No nausea or vomiting. He took an acid medicine with some slight improvement with Tums. He states that the pain was intermittent through the night and then returned this morning. He went to [facility] urgent care and was sent here for further evaluation.      Past Medical History:  Diagnosis Date  . Ankle impingement syndrome    Right ankle from bone spur  . Antiphospholipid antibody syndrome ([place]) 06/12/2012  . Coagulopathy ([place]) 06/12/2012  . DVT (deep venous thrombosis) ([place]) 1990, 2002   chronic hypercoagulobility.   . DVT, recurrent, lower extremity, chronic ([place]) 06/12/2012   1992  . Dysrhythmia    AF  . GERD (gastroesophageal reflux disease)   . H/O tinnitus   . Heterozygous factor V Leiden mutation ([place]) 06/12/2012  . Hyperlipidemia   . Migraine headache   . Protein S deficiency ([place]) 06/12/2012  . Prothrombin gene mutation ([place]) 06/12/2012  . Pulmonary embolism ([place])   . Pulmonary embolism, bilateral ([place]) 06/12/2012   July, 2002  . PVC (premature ventricular contraction)   . Varicose veins 06/12/2012   RLE  . Wears hearing aid     Patient Active Problem List   Diagnosis Date Noted   . Acute epigastric pain 02/04/2017  . Coagulopathy ([place]) 06/12/2012  . Heterozygous factor V Leiden mutation ([place]) 06/12/2012  . Prothrombin gene mutation ([place]) 06/12/2012  . Antiphospholipid antibody syndrome ([place]) 06/12/2012  . Protein S deficiency ([place]) 06/12/2012  . DVT, recurrent, lower extremity, chronic ([place]) 06/12/2012  . Pulmonary embolism, bilateral ([place]) 06/12/2012  . Varicose veins 06/12/2012  . Abdominal aneurysm without mention of rupture 03/12/2012  . Dissection of aorta, abdominal ([place]) 03/12/2012  . ABNORMALITY OF GAIT 07/02/2009  . SPRAIN&STRAIN OTHER SPECIFIED SITES KNEE&LEG 07/02/2009    Past Surgical History:  Procedure Laterality Date  . ABDOMINAL AORTIC ANEURYSM REPAIR  12/14   [facility]  . ANKLE SURGERY  2005   right  . APPENDECTOMY    . ARTERY BIOPSY Left 08/12/2014   Procedure: LEFT TEMPORAL ARTERY BIOPSY ;  Surgeon: Coralie Keens, MD;  Location: [facility];  Service: General;  Laterality: Left;  . CHOLECYSTECTOMY  2008   lap choli  . COLONOSCOPY N/A 09/03/2013   Procedure: COLONOSCOPY;  Surgeon: Garlan Fair, MD;  Location: [facility];  Service: Endoscopy;  Laterality: N/A;  . FINGER SURGERY     Right 1st finger,  by Dr. Sharol Given  . FINGER TENDON REPAIR  2013   left 5th finger by Dr. Caralyn Guile  . PATELLA RELEASE AND MANIPULATION  1992   Right [place]       Home Medications    Prior  to Admission medications   Medication Sig Start Date End Date Taking? Authorizing Provider  acetaminophen (TYLENOL) 500 MG tablet Take 1,000 mg by mouth every 6 (six) hours as needed.   Yes [provider]  calcium-vitamin D 250-100 MG-UNIT tablet Take 1 tablet by mouth daily.   Yes [provider]  celecoxib (CELEBREX) 200 MG capsule Take 200 mg by mouth daily as needed. For pain   Yes [provider]  ezetimibe (ZETIA) 10 MG tablet Take 1 tablet by mouth daily. 01/27/17  Yes [provider]  gabapentin (NEURONTIN)  600 MG tablet Take 300-600 mg by mouth See admin instructions. 600 in the morning and 300 at night   Yes [provider]  HYDROcodone-acetaminophen (NORCO) 5-325 MG per tablet Take 1-2 tablets by mouth every 4 (four) hours as needed. 08/12/14  Yes Coralie Keens, MD  Multiple Vitamin (MULITIVITAMIN WITH MINERALS) TABS Take 1 tablet by mouth daily.   Yes [provider]  pravastatin (PRAVACHOL) 80 MG tablet Take 80 mg by mouth daily.   Yes [provider]  warfarin (JANTOVEN) 5 MG tablet Take 5 mg by mouth daily. 5 mg every day.   Yes [provider]  predniSONE (DELTASONE) 20 MG tablet Take 20 mg by mouth daily with breakfast. 60mg     [provider]  ramipril (ALTACE) 10 MG capsule Take 10 mg by mouth daily.    [provider]  valsartan (DIOVAN) 40 MG tablet Take 40 mg by mouth daily.    [provider]  warfarin (COUMADIN) 5 MG tablet Take 5 mg by mouth daily.    [provider]    Family History Family History  Problem Relation Age of Onset  . Cancer Mother        Breast cancer  . Hypertension Mother   . Diabetes Mother   . Fibromyalgia Mother   . Stroke Mother   . Stroke Father   . Heart disease Father   . Alcohol abuse Sister   . Hyperlipidemia Sister   . Hypertension Sister   . Heart disease Brother        CABG at 64  . Stroke Brother   . Pulmonary embolism Brother   . Heart disease Other   . Hyperlipidemia Other   . Heart disease Brother        MI at 59 CABG  . Heart disease Brother        CABG,   MI at 60  . Stroke Sister 87  . Heart disease Sister   . Cancer Sister        Breast Cancer  . Other Sister        acoustic tumor  . Deep vein thrombosis Sister     Social History Social History  Substance Use Topics  . Smoking status: Never Smoker  . Smokeless tobacco: Never Used  . Alcohol use No     Allergies   Niacin and related; Phisohex [hexachlorophene]; and Topamax  [topiramate]   Review of Systems Review of Systems  Constitutional: Positive for diaphoresis and fatigue. Negative for chills and fever.  HENT: Negative for congestion, rhinorrhea and sneezing.   Eyes: Negative.   Respiratory: Negative for cough, chest tightness and shortness of breath.   Cardiovascular: Negative for chest pain and leg swelling.  Gastrointestinal: Positive for abdominal pain. Negative for blood in stool, diarrhea, nausea and vomiting.       Epigastric pain   Genitourinary: Negative for difficulty urinating, flank pain,  frequency and hematuria.  Musculoskeletal: Negative for arthralgias and back pain.  Skin: Negative for rash.  Neurological: Positive for light-headedness. Negative for dizziness, speech difficulty, weakness, numbness and headaches.     Physical Exam Updated Vital Signs BP (!) 160/83   Pulse (!) 57   Temp 98.4 F (36.9 C) (Oral)   Resp 12   Ht 6\' 2"  (1.88 m)   Wt 90.7 kg (200 lb)   SpO2 100%   BMI 25.68 kg/m   Physical Exam  Constitutional: He is oriented to person, place, and time. He appears well-developed and well-nourished.  HENT:  Head: Normocephalic and atraumatic.  Eyes: Pupils are equal, round, and reactive to light.  Neck: Normal range of motion. Neck supple.  Cardiovascular: Normal rate, regular rhythm and normal heart sounds.   Pulmonary/Chest: Effort normal and breath sounds normal. No respiratory distress. He has no wheezes. He has no rales. He exhibits no tenderness.  Abdominal: Soft. Bowel sounds are normal. There is tenderness. There is no rebound and no guarding.  Mild tenderness to epigastrium  Musculoskeletal: Normal range of motion. He exhibits no edema.  Lymphadenopathy:    He has no cervical adenopathy.  Neurological: He is alert and oriented to person, place, and time.  Skin: Skin is warm and dry. No rash noted.  Psychiatric: He has a normal mood and affect.     ED Treatments / Results  Labs (all labs ordered  are listed, but only abnormal results are displayed) Labs Reviewed  CBC - Abnormal; Notable for the following:       Result Value   Platelets 145 (*)    All other components within normal limits  COMPREHENSIVE METABOLIC PANEL - Abnormal; Notable for the following:    Glucose, Bld 114 (*)    Creatinine, Ser 1.29 (*)    AST 426 (*)    ALT 347 (*)    GFR calc non Af Amer 57 (*)    All other components within normal limits  PROTIME-INR - Abnormal; Notable for the following:    Prothrombin Time 23.8 (*)    All other components within normal limits  ACETAMINOPHEN LEVEL - Abnormal; Notable for the following:    Acetaminophen (Tylenol), Serum <10 (*)    All other components within normal limits  URINALYSIS, ROUTINE W REFLEX MICROSCOPIC  LIPASE, BLOOD  HEPATITIS PANEL, ACUTE  HIV ANTIBODY (ROUTINE TESTING)  CBG MONITORING, ED  I-STAT TROPOININ, ED  I-STAT TROPOININ, ED    EKG  EKG Interpretation  Date/Time:  Saturday February 04 2017 13:22:16 EDT Ventricular Rate:  54 PR Interval:  156 QRS Duration: 117 QT Interval:  437 QTC Calculation: 415 R Axis:   -62 Text Interpretation:  Sinus rhythm Incomplete RBBB and LAFB since last tracing no significant change Confirmed by Malvin Johns [phone]) on 02/04/2017 1:33:43 PM       Radiology Dg Chest 2 View  Result Date: 02/04/2017 CLINICAL DATA:  Patient status post left knee arthroscopy. Weakness and nausea. EXAM: CHEST  2 VIEW COMPARISON:  Chest radiograph 02/25/2010 FINDINGS: Monitoring leads overlie the patient. Stable prominent cardiac and mediastinal contours. No consolidative pulmonary opacities. No pleural effusion or pneumothorax. Thoracic spine degenerative changes. Cholecystectomy clips. IMPRESSION: No acute cardiopulmonary process. Electronically Signed   By: Lovey Newcomer M.D.   On: 02/04/2017 12:56   US Abdomen Complete  Result Date: 02/04/2017 CLINICAL DATA:  Patient with epigastric abdominal pain. Elevated LFTs. EXAM: ABDOMEN  ULTRASOUND COMPLETE COMPARISON:  CT abdomen pelvis 09/25/2012 FINDINGS: Gallbladder:  Surgically absent Common bile duct: Diameter: 8.8 mm Liver: No focal lesion identified. Within normal limits in parenchymal echogenicity. IVC: No abnormality visualized. Pancreas: Not well visualized due to overlying bowel gas. Spleen: Size and appearance within normal limits. Right Kidney: Length: 13.2 cm. Multiple cysts demonstrated within the right kidney measuring up to 3.1 cm. Normal renal cortical thickness and echogenicity. Left Kidney: Length: 13.7 cm. Echogenicity within normal limits. No mass or hydronephrosis visualized. Abdominal aorta: Only the proximal aorta is visualized. Other findings: None. IMPRESSION: No acute process within the abdomen. Prior cholecystectomy. No hydronephrosis. Electronically Signed   By: Lovey Newcomer M.D.   On: 02/04/2017 14:45    Procedures Procedures (including critical care time)  Medications Ordered in ED Medications  sodium chloride 0.9 % bolus 500 mL (not administered)  gi cocktail (Maalox,Lidocaine,Donnatal) (not administered)  warfarin (COUMADIN) tablet 5 mg (not administered)  ezetimibe (ZETIA) tablet 10 mg (not administered)  HYDROcodone-acetaminophen (NORCO/VICODIN) 5-325 MG per tablet 1-2 tablet (not administered)  gabapentin (NEURONTIN) tablet 300-600 mg (not administered)  pravastatin (PRAVACHOL) tablet 80 mg (not administered)  0.9 %  sodium chloride infusion (not administered)  acetaminophen (TYLENOL) tablet 650 mg (not administered)    Or  acetaminophen (TYLENOL) suppository 650 mg (not administered)  traZODone (DESYREL) tablet 25 mg (not administered)  senna-docusate (Senokot-S) tablet 1 tablet (not administered)  bisacodyl (DULCOLAX) suppository 10 mg (not administered)  ondansetron (ZOFRAN) tablet 4 mg (not administered)    Or  ondansetron (ZOFRAN) injection 4 mg (not administered)  sodium chloride flush (NS) 0.9 % injection 3 mL (not administered)   gi cocktail (Maalox,Lidocaine,Donnatal) (30 mLs Oral Given 02/04/17 1310)     Initial Impression / Assessment and Plan / ED Course  I have reviewed the triage vital signs and the nursing notes.  Pertinent labs & imaging results that were available during my care of the patient were reviewed by me and considered in my medical decision making (see chart for details).  Clinical Course as of Feb 04 1633  Sat Feb 04, 2017  1348 Patient's LFTs are elevated. He is status post cholecystectomy but does state that he had hepatitis as a young man. He doesn't member what kind it was but it sounds infectious because other family members had to get shots.  [MB]    Clinical Course User Index [MB] Malvin Johns, MD    Patient presents with epigastric discomfort in association with diaphoresis and nausea. He doesn't have other symptoms that would be more suggestive of acute coronary syndrome. He's had troponin is negative. There is no ischemic changes on EKG. His LFTs are markedly elevated. His lipase is normal. He is status post cholecystectomy. Ultrasound of his upper abdomen was unremarkable. He continues to have some mild epigastric discomfort but no vomiting. His acetaminophen level is normal. I did review old records from [facility] and his LFTs were normal in November 2017 which is last time I saw blood work done.  I spoke with Dr. Evangeline Gula who will admit the pt.  Final Clinical Impressions(s) / ED Diagnoses   Final diagnoses:  Hepatitis  Epigastric pain    New Prescriptions New Prescriptions   No medications on file     Malvin Johns, MD 02/04/17 [phone]

## 2017-02-04 NOTE — ED Notes (Signed)
Pt given [place] sandwich and water per Dr. Tamera Punt

## 2017-02-04 NOTE — ED Notes (Signed)
Pt sent from [facility] u/c.  S/p arthroscopy left knee yesterday, onset last night 7pm indigestion, clammy, weak, and nausea.  Denies chest pain.

## 2017-02-04 NOTE — H&P (Signed)
History and Physical    Don Ruiz [id number] DOB: Feb 16, 1951 DOA: 02/04/2017   PCP: Lavone Orn, MD   Patient coming from:  Home    Chief Complaint: Epigastric pain   HPI: Don Ruiz is a 66 y.o. male with medical history significant  for priority history of  DVT/ PE Factor V Leiden mutation / APL Ab syndrome/ Prot S deficiency/ Prot G mutation on Coumadin , prior history of abdominal aortic aneurysm dissection status post repair, presenting with epigastric pain since last evening, following left knee arthroscopic surgery. The patient was overall stable following his surgery and about 7 PM last night begun having intense discomfort in his epigastric, nonradiating. He denies any shortness of breath but did feel clammy. He reported that his pain was better control with ambulation. He denies any back pain. He had some nausea without vomiting. The patient to antacid and NSAIDs, as well as narcotic pain medication with some improvement of his symptoms. The pain returned this morning, he went to [facility]  to the ED for further evaluation. He reported not been able to have much oral liquid intake, she was fatigue, and had diaphoresis. He reported some dizziness when standing up. No confusion is reported. He denies any chest pain or palpitations. He denies any cough, or recent infection. He denies any worsening leg swelling other than the known area of surgery. He reports intermittent pain in the area, but this is not worsening. He denies any headaches or vision changes. He has history of tinnitus, but no recent labyrinthitis.  ED Course:  BP (!) 160/83   Pulse (!) 57   Temp 98.4 F (36.9 C) (Oral)   Resp 12   Ht 6\' 2"  (1.88 m)   Wt 90.7 kg (200 lb)   SpO2 100%   BMI 25.68 kg/m    blood pressure 147/73 with pulse 57 lying, and 125/86 and 97 standing suggestive of orthostatic changes  LFTs were markedly elevated, with normal lipase ( status post cholecystectomy )ultrasound of the  upper abdomen was unremarkable received IV Zofran with some control of his nausea. Received IV fluids, G.I. cocktail, and labs including hepatitis panel were drawn Current IVF at 125 cc/h   Review of Systems:  As per HPI otherwise all other systems reviewed and are negative  Past Medical History:  Diagnosis Date  . Ankle impingement syndrome    Right ankle from bone spur  . Antiphospholipid antibody syndrome ([place]) 06/12/2012  . Coagulopathy ([place]) 06/12/2012  . DVT (deep venous thrombosis) ([place]) 1990, 2002   chronic hypercoagulobility.   . DVT, recurrent, lower extremity, chronic ([place]) 06/12/2012   1992  . Dysrhythmia    AF  . GERD (gastroesophageal reflux disease)   . H/O tinnitus   . Heterozygous factor V Leiden mutation ([place]) 06/12/2012  . Hyperlipidemia   . Migraine headache   . Protein S deficiency ([place]) 06/12/2012  . Prothrombin gene mutation ([place]) 06/12/2012  . Pulmonary embolism ([place])   . Pulmonary embolism, bilateral ([place]) 06/12/2012   July, 2002  . PVC (premature ventricular contraction)   . Varicose veins 06/12/2012   RLE  . Wears hearing aid     Past Surgical History:  Procedure Laterality Date  . ABDOMINAL AORTIC ANEURYSM REPAIR  12/14   [facility]  . ANKLE SURGERY  2005   right  . APPENDECTOMY    . ARTERY BIOPSY Left 08/12/2014   Procedure: LEFT TEMPORAL ARTERY BIOPSY ;  Surgeon: Coralie Keens, MD;  Location: [facility];  Service: General;  Laterality: Left;  . CHOLECYSTECTOMY  2008   lap choli  . COLONOSCOPY N/A 09/03/2013   Procedure: COLONOSCOPY;  Surgeon: Garlan Fair, MD;  Location: [facility];  Service: Endoscopy;  Laterality: N/A;  . FINGER SURGERY     Right 1st finger,  by Dr. Sharol Given  . FINGER TENDON REPAIR  2013   left 5th finger by Dr. Caralyn Guile  . PATELLA RELEASE AND MANIPULATION  1992   Right [place]    Social History Social History   Social History  . Marital status: Married    Spouse name: N/A  . Number of children: N/A    . Years of education: N/A   Occupational History  . Not on file.   Social History Main Topics  . Smoking status: Never Smoker  . Smokeless tobacco: Never Used  . Alcohol use No  . Drug use: No  . Sexual activity: Not on file   Other Topics Concern  . Not on file   Social History Narrative  . No narrative on file     Allergies  Allergen Reactions  . Niacin And Related     Hepatitis  . Phisohex [Hexachlorophene]     rash  . Topamax [Topiramate]     suicidal    Family History  Problem Relation Age of Onset  . Cancer Mother        Breast cancer  . Hypertension Mother   . Diabetes Mother   . Fibromyalgia Mother   . Stroke Mother   . Stroke Father   . Heart disease Father   . Alcohol abuse Sister   . Hyperlipidemia Sister   . Hypertension Sister   . Heart disease Brother        CABG at 48  . Stroke Brother   . Pulmonary embolism Brother   . Heart disease Other   . Hyperlipidemia Other   . Heart disease Brother        MI at 64 CABG  . Heart disease Brother        CABG,   MI at 50  . Stroke Sister 73  . Heart disease Sister   . Cancer Sister        Breast Cancer  . Other Sister        acoustic tumor  . Deep vein thrombosis Sister       Prior to Admission medications   Medication Sig Start Date End Date Taking? Authorizing Provider  acetaminophen (TYLENOL) 500 MG tablet Take 1,000 mg by mouth every 6 (six) hours as needed.   Yes [provider]  calcium-vitamin D 250-100 MG-UNIT tablet Take 1 tablet by mouth daily.   Yes [provider]  celecoxib (CELEBREX) 200 MG capsule Take 200 mg by mouth daily as needed. For pain   Yes [provider]  ezetimibe (ZETIA) 10 MG tablet Take 1 tablet by mouth daily. 01/27/17  Yes [provider]  gabapentin (NEURONTIN) 600 MG tablet Take 300-600 mg by mouth See admin instructions. 600 in the morning and 300 at night   Yes [provider]  HYDROcodone-acetaminophen (NORCO)  5-325 MG per tablet Take 1-2 tablets by mouth every 4 (four) hours as needed. 08/12/14  Yes Coralie Keens, MD  Multiple Vitamin (MULITIVITAMIN WITH MINERALS) TABS Take 1 tablet by mouth daily.   Yes [provider]  pravastatin (PRAVACHOL) 80 MG tablet Take 80 mg by mouth daily.   Yes [provider]  warfarin (  JANTOVEN) 5 MG tablet Take 5 mg by mouth daily. 5 mg every day.   Yes [provider]  predniSONE (DELTASONE) 20 MG tablet Take 20 mg by mouth daily with breakfast. 60mg     [provider]  ramipril (ALTACE) 10 MG capsule Take 10 mg by mouth daily.    [provider]  valsartan (DIOVAN) 40 MG tablet Take 40 mg by mouth daily.    [provider]  warfarin (COUMADIN) 5 MG tablet Take 5 mg by mouth daily.    [provider]    Physical Exam:  Vitals:   02/04/17 1529 02/04/17 1530 02/04/17 1545 02/04/17 1600  BP: (!) 149/75 (!) 152/81 (!) 153/93 (!) 160/83  Pulse: (!) 58 63 78 (!) 57  Resp: 15 12 (!) 24 12  Temp:      TempSrc:      SpO2: 99% 99% 96% 100%  Weight:      Height:       Constitutional: NAD, calm, ill appearing  Eyes: PERRL, lids and conjunctivae normal ENMT: Mucous membranes are moist, without exudate or lesions  Neck: normal, supple, no masses, no thyromegaly Respiratory: clear to auscultation bilaterally, no wheezing, no crackles. Normal respiratory effort  Cardiovascular: Regular rate and rhythm, no murmurs, rubs or gallops. No extremity edema. 2+ pedal pulses. No carotid bruits.  Abdomen: Soft, epigastric tenderness noted , No hepatosplenomegaly. Bowel sounds positive.  Musculoskeletal: no clubbing / cyanosis. LLE with bandage post surgery  Skin: no jaundice, No lesions other than bandaged area at lap site  Neurologic: Sensation intact  Strength equal in all extremities Psychiatric:   Alert and oriented x 3. Normal mood.     Labs on Admission: I have personally reviewed following labs and imaging  studies  CBC:  Recent Labs Lab 02/04/17 1228  WBC 8.8  HGB 14.7  HCT 44.9  MCV 86.5  PLT 145*    Basic Metabolic Panel:  Recent Labs Lab 02/04/17 1228  NA 138  K 4.1  CL 104  CO2 27  GLUCOSE 114*  BUN 17  CREATININE 1.29*  CALCIUM 9.8    GFR: Estimated Creatinine Clearance: 68.1 mL/min (A) (by C-G formula based on SCr of 1.29 mg/dL (H)).  Liver Function Tests:  Recent Labs Lab 02/04/17 1228  AST 426*  ALT 347*  ALKPHOS 111  BILITOT 0.9  PROT 7.3  ALBUMIN 4.2    Recent Labs Lab 02/04/17 1228  LIPASE 37   No results for input(s): AMMONIA in the last 168 hours.  Coagulation Profile:  Recent Labs Lab 02/04/17 1228  INR 2.09    Cardiac Enzymes: No results for input(s): CKTOTAL, CKMB, CKMBINDEX, TROPONINI in the last 168 hours.  BNP (last 3 results) No results for input(s): PROBNP in the last 8760 hours.  HbA1C: No results for input(s): HGBA1C in the last 72 hours.  CBG:  Recent Labs Lab 02/04/17 1227  GLUCAP 98    Lipid Profile: No results for input(s): CHOL, HDL, LDLCALC, TRIG, CHOLHDL, LDLDIRECT in the last 72 hours.  Thyroid Function Tests: No results for input(s): TSH, T4TOTAL, FREET4, T3FREE, THYROIDAB in the last 72 hours.  Anemia Panel: No results for input(s): VITAMINB12, FOLATE, FERRITIN, TIBC, IRON, RETICCTPCT in the last 72 hours.  Urine analysis:    Component Value Date/Time   COLORURINE YELLOW 02/04/2017 1200   APPEARANCEUR CLEAR 02/04/2017 1200   LABSPEC 1.021 02/04/2017 1200   PHURINE 5.0 02/04/2017 1200   GLUCOSEU NEGATIVE 02/04/2017 1200   HGBUR NEGATIVE  02/04/2017 [place] 02/04/2017 1200   KETONESUR NEGATIVE 02/04/2017 1200   PROTEINUR NEGATIVE 02/04/2017 1200   NITRITE NEGATIVE 02/04/2017 1200   LEUKOCYTESUR NEGATIVE 02/04/2017 1200    Sepsis Labs: @LABRCNTIP (procalcitonin:4,lacticidven:4) )No results found for this or any previous visit (from the past 240 hour(s)).    Radiological Exams on Admission: Dg Chest 2 View  Result Date: 02/04/2017 CLINICAL DATA:  Patient status post left knee arthroscopy. Weakness and nausea. EXAM: CHEST  2 VIEW COMPARISON:  Chest radiograph 02/25/2010 FINDINGS: Monitoring leads overlie the patient. Stable prominent cardiac and mediastinal contours. No consolidative pulmonary opacities. No pleural effusion or pneumothorax. Thoracic spine degenerative changes. Cholecystectomy clips. IMPRESSION: No acute cardiopulmonary process. Electronically Signed   By: Lovey Newcomer M.D.   On: 02/04/2017 12:56   US Abdomen Complete  Result Date: 02/04/2017 CLINICAL DATA:  Patient with epigastric abdominal pain. Elevated LFTs. EXAM: ABDOMEN ULTRASOUND COMPLETE COMPARISON:  CT abdomen pelvis 09/25/2012 FINDINGS: Gallbladder: Surgically absent Common bile duct: Diameter: 8.8 mm Liver: No focal lesion identified. Within normal limits in parenchymal echogenicity. IVC: No abnormality visualized. Pancreas: Not well visualized due to overlying bowel gas. Spleen: Size and appearance within normal limits. Right Kidney: Length: 13.2 cm. Multiple cysts demonstrated within the right kidney measuring up to 3.1 cm. Normal renal cortical thickness and echogenicity. Left Kidney: Length: 13.7 cm. Echogenicity within normal limits. No mass or hydronephrosis visualized. Abdominal aorta: Only the proximal aorta is visualized. Other findings: None. IMPRESSION: No acute process within the abdomen. Prior cholecystectomy. No hydronephrosis. Electronically Signed   By: Lovey Newcomer M.D.   On: 02/04/2017 14:45    EKG: Independently reviewed.  Assessment/Plan Active Problems:   Acute epigastric pain   Dissection of aorta, abdominal (HCC)   CAD (coronary artery disease)   Chronic anticoagulation   GERD (gastroesophageal reflux disease)   HTN (hypertension)   Primary hypercoagulable state ([place])   SNHL (sensorineural hearing loss)   Acute Epigastric pain with  Nausea without  vomiting in the setting of L knee arthroscopic  surgery  on 02/03/17 . Patient did take NSAIDs, narcotic medications for management of this pain, and received general anesthesia during his surgery. These may be contributing factors to his symptoms. He had poor liquid intake since yesterday. He said this week, in reports dizziness upon getting up.  Afebrile, nontoxic appearing. Exam  remarkable for mid epigastric pain on light palpation. WBC normalabdominal ultrasound without acute process seen. LFTs were noted to be elevated, Hepatitis panel pending. No history of alcohol or tobacco abuse. Telemetry observation Bowel rest, plans to clear liquid as tolerated, then moved to regular diet ones able to eat. IVZofran in view of needs for pain control due to recent surgery  IV fluids continue G.I. cocktail. In. await for hepatitis panel  Results,repeat CMET in am  Advance diet as tolerated  Orthostatic hypotension, in the setting of dehydration due to poor oral intake Received IVF at the ED  blood pressure 147/73 with pulse 57 lying, and 125/86 and 97 standing suggestive of orthostatic changes . WBC normal. EKG SR with RBBB without changes from prior Continue to monitor. Hold BP meds for now, resume in am  Continue IVF     History of DVT/ PE Factor V Leiden mutation / APL Ab syndrome/ Prot S deficiency/ Prot G mutation. Current PLt 145k . INR 2.09  Continue Coumadin   GERD, received GI cocktail at the ED with some relief  Continue PPI Continue GI coktail  Acute Kidney  Injury likely due to dehydration    BL Cr  1.03-1.116, current at 1.29  Lab Results  Component Value Date   CREATININE 1.29 (H) 02/04/2017   CREATININE 1.03 08/11/2014   CREATININE 1.16 05/18/2010  IVF CMET in am  I/O and daily weights  Hyperlipidemia Continue home statins   DVT prophylaxis:  Coumadin  Code Status:   Full     Family Communication:  Discussed with patient Disposition Plan: Expect patient to be discharged to  home after condition improves Consults called:    None  Admission status:Tel  Obs   Don Ruiz E, PA-C [facility]   02/04/2017, 4:40 PM

## 2017-02-04 NOTE — ED Notes (Signed)
Receiving RN on [address]  Updated on orthostatic vitals. Admitting  MD also notified of orthostatic vitals . Call to [phone]

## 2017-02-05 DIAGNOSIS — Z7901 Long term (current) use of anticoagulants: Secondary | ICD-10-CM | POA: Diagnosis not present

## 2017-02-05 DIAGNOSIS — I10 Essential (primary) hypertension: Secondary | ICD-10-CM

## 2017-02-05 DIAGNOSIS — D6859 Other primary thrombophilia: Secondary | ICD-10-CM

## 2017-02-05 DIAGNOSIS — R1013 Epigastric pain: Secondary | ICD-10-CM | POA: Diagnosis not present

## 2017-02-05 DIAGNOSIS — R7989 Other specified abnormal findings of blood chemistry: Secondary | ICD-10-CM | POA: Diagnosis not present

## 2017-02-05 DIAGNOSIS — R945 Abnormal results of liver function studies: Secondary | ICD-10-CM

## 2017-02-05 LAB — COMPREHENSIVE METABOLIC PANEL
ALK PHOS: 91 U/L (ref 38–126)
ALT: 196 U/L — AB (ref 17–63)
AST: 144 U/L — ABNORMAL HIGH (ref 15–41)
Albumin: 3.6 g/dL (ref 3.5–5.0)
Anion gap: 6 (ref 5–15)
BILIRUBIN TOTAL: 0.8 mg/dL (ref 0.3–1.2)
BUN: 16 mg/dL (ref 6–20)
CALCIUM: 8.6 mg/dL — AB (ref 8.9–10.3)
CO2: 27 mmol/L (ref 22–32)
CREATININE: 1.08 mg/dL (ref 0.61–1.24)
Chloride: 108 mmol/L (ref 101–111)
GFR calc Af Amer: >60 mL/min (ref >60)
Glucose, Bld: 92 mg/dL (ref 65–99)
Potassium: 3.8 mmol/L (ref 3.5–5.1)
SODIUM: 141 mmol/L (ref 135–145)
TOTAL PROTEIN: 6.4 g/dL — AB (ref 6.5–8.1)

## 2017-02-05 LAB — CBC
HCT: 41.4 % (ref 39.0–52.0)
Hemoglobin: 13.1 g/dL (ref 13.0–17.0)
MCH: 27.3 pg (ref 26.0–34.0)
MCHC: 31.6 g/dL (ref 30.0–36.0)
MCV: 86.4 fL (ref 78.0–100.0)
PLATELETS: 181 10*3/uL (ref 150–400)
RBC: 4.79 MIL/uL (ref 4.22–5.81)
RDW: 14.2 % (ref 11.5–15.5)
WBC: 7.5 10*3/uL (ref 4.0–10.5)

## 2017-02-05 LAB — HEPATITIS PANEL, ACUTE
HCV Ab: <0.1 s/co ratio (ref 0.0–0.9)
Hep A IgM: NEGATIVE
Hep B C IgM: NEGATIVE
Hepatitis B Surface Ag: NEGATIVE

## 2017-02-05 LAB — PROTIME-INR
INR: 2.33
PROTHROMBIN TIME: 25.9 s — AB (ref 11.4–15.2)

## 2017-02-05 LAB — HIV ANTIBODY (ROUTINE TESTING W REFLEX): HIV Screen 4th Generation wRfx: NONREACTIVE

## 2017-02-05 NOTE — Discharge Summary (Signed)
Physician Discharge Summary  Don Ruiz [id number] DOB: November 06, 1950 DOA: 02/04/2017  PCP: Lavone Orn, MD  Admit date: 02/04/2017 Discharge date: 02/05/2017  Admitted From: Home Disposition: Home   Recommendations for Outpatient Follow-up:  1. Follow up with PCP in 1-2 days for repeat labs. Suspect anesthetic complication causing abd pain and elevated LFTs. 2. Holding statin, consider restart with normalization of LFTs.  Home Health: None Equipment/Devices: None Discharge Condition: Stable CODE STATUS: Full Diet recommendation: Heart healthy  Brief/Interim Summary: Don Ruiz is a 66 y.o. male with a history of coagulopathy on coumadin, AAA dissection s/p repair, and OA who presented to the ED 6/2 the day following left knee arthroscopy complaining of epigastric pain associated with nausea without emesis. He was orthostatic and LFTs were elevated with normal lipase. Abdominal U/S showed known renal cysts without acute abnormalities. Tylenol was held (level was negative), acute hepatitis panel sent (and negative), and he was given IV fluids and antiemetics overnight. This morning he reports resolution of symptoms. LFTs have improved. He will be discharged in stable condition to follow up with PCP this week for labs. Return precautions advised.   Discharge Diagnoses:  Active Problems:   Dissection of aorta, abdominal (HCC)   Acute epigastric pain   CAD (coronary artery disease)   Chronic anticoagulation   GERD (gastroesophageal reflux disease)   HTN (hypertension)   Primary hypercoagulable state (HCC)   SNHL (sensorineural hearing loss)  Elevated LFTs: Presumed reaction to anesthetic, +/- narcotics led to poor per oral intake and dehydration. Tylenol level negative, acute hepatitis panel negative US unremarkable, WBC normal. Symptoms resolved overnight, tolerating a diet. No orthostatic symptoms this AM. - Hold tylenol and statin until follow up with PCP  History of DVT/ PE  Factor V Leiden mutation / APL Ab syndrome/ Prot S deficiency/ Prot G mutation.  - Current lt 181k. INR 2.09  - Continue Coumadin   AKI: Cr 1.29 on admission, improved with IVF to baseline at 1.08.   Discharge Instructions Discharge Instructions    Discharge instructions    Complete by:  As directed    You were admitted with abdominal pain, nausea and weakness and elevated liver function tests. These symptoms and the LFTs have improved overnight. While it remains uncertain what caused these symptoms, the most likely explanation is anesthetic reaction. Because symptoms are improving, you are stable for discharge with the following recommendations:  - Stop taking tylenol. It's ok to take norco as directed for postoperative pain because your tylenol level was negative, but adding tylenol to this may contribute to liver function abnormalities.  - If your symptoms return, seek medical attention. Otherwise, call for a follow up appointment early this week with repeat lab draw.     Allergies as of 02/05/2017      Reactions   Niacin And Related    Hepatitis   Phisohex [hexachlorophene]    rash   Topamax [topiramate]    suicidal      Medication List    STOP taking these medications   acetaminophen 500 MG tablet Commonly known as:  TYLENOL   predniSONE 20 MG tablet Commonly known as:  DELTASONE   ramipril 10 MG capsule Commonly known as:  ALTACE   valsartan 40 MG tablet Commonly known as:  DIOVAN     TAKE these medications   calcium-vitamin D 250-100 MG-UNIT tablet Take 1 tablet by mouth daily.   celecoxib 200 MG capsule Commonly known as:  CELEBREX Take 200 mg by mouth daily  as needed. For pain   ezetimibe 10 MG tablet Commonly known as:  ZETIA Take 1 tablet by mouth daily.   gabapentin 600 MG tablet Commonly known as:  NEURONTIN Take 300-600 mg by mouth See admin instructions. 600 in the morning and 300 at night   HYDROcodone-acetaminophen 5-325 MG tablet Commonly  known as:  NORCO Take 1-2 tablets by mouth every 4 (four) hours as needed.   JANTOVEN 5 MG tablet Generic drug:  warfarin Take 5 mg by mouth daily. 5 mg every day. What changed:  Another medication with the same name was removed. Continue taking this medication, and follow the directions you see here.   multivitamin with minerals Tabs tablet Take 1 tablet by mouth daily.   pravastatin 80 MG tablet Commonly known as:  PRAVACHOL Take 80 mg by mouth daily.      Follow-up Information    Lavone Orn, MD. Schedule an appointment as soon as possible for a visit in 1 day(s).   Specialty:  Internal Medicine Contact information: [address] [place] [address] [phone]          Allergies  Allergen Reactions  . Niacin And Related     Hepatitis  . Phisohex [Hexachlorophene]     rash  . Topamax [Topiramate]     suicidal    Consultations:  None  Procedures/Studies: Dg Chest 2 View  Result Date: 02/04/2017 CLINICAL DATA:  Patient status post left knee arthroscopy. Weakness and nausea. EXAM: CHEST  2 VIEW COMPARISON:  Chest radiograph 02/25/2010 FINDINGS: Monitoring leads overlie the patient. Stable prominent cardiac and mediastinal contours. No consolidative pulmonary opacities. No pleural effusion or pneumothorax. Thoracic spine degenerative changes. Cholecystectomy clips. IMPRESSION: No acute cardiopulmonary process. Electronically Signed   By: Lovey Newcomer M.D.   On: 02/04/2017 12:56   US Abdomen Complete  Result Date: 02/04/2017 CLINICAL DATA:  Patient with epigastric abdominal pain. Elevated LFTs. EXAM: ABDOMEN ULTRASOUND COMPLETE COMPARISON:  CT abdomen pelvis 09/25/2012 FINDINGS: Gallbladder: Surgically absent Common bile duct: Diameter: 8.8 mm Liver: No focal lesion identified. Within normal limits in parenchymal echogenicity. IVC: No abnormality visualized. Pancreas: Not well visualized due to overlying bowel gas. Spleen: Size and appearance within  normal limits. Right Kidney: Length: 13.2 cm. Multiple cysts demonstrated within the right kidney measuring up to 3.1 cm. Normal renal cortical thickness and echogenicity. Left Kidney: Length: 13.7 cm. Echogenicity within normal limits. No mass or hydronephrosis visualized. Abdominal aorta: Only the proximal aorta is visualized. Other findings: None. IMPRESSION: No acute process within the abdomen. Prior cholecystectomy. No hydronephrosis. Electronically Signed   By: Lovey Newcomer M.D.   On: 02/04/2017 14:45   Subjective: Tolerated sandwich last night and breakfast this morning without pain, nausea, or vomiting. No rashes. Pain controlled.  Discharge Exam: BP (!) 141/81 (BP Location: Left Arm)   Pulse (!) 59   Temp 98.6 F (37 C) (Oral)   Resp 18   Ht 6\' 4"  (1.93 m)   Wt 92.3 kg (203 lb 8 oz)   SpO2 94%   BMI 24.77 kg/m   General: Pleasant 66yo M in no distress Cardiovascular: RRR, no murmur, no JVD. Respiratory: Nonlabored, clear Abdominal: Soft, NT, ND, bowel sounds +, no hepatomegaly. Extremities: Right knee swollen with dressing c/d/i. Pain limiting ROM. Distally, strength and sensation intact, cap refill brisk.  Skin: No jaundice.  Labs: Basic Metabolic Panel:  Recent Labs Lab 02/04/17 1228 02/05/17 0554  NA 138 141  K 4.1 3.8  CL  104 108  CO2 27 27  GLUCOSE 114* 92  BUN 17 16  CREATININE 1.29* 1.08  CALCIUM 9.8 8.6*   Liver Function Tests:  Recent Labs Lab 02/04/17 1228 02/05/17 0554  AST 426* 144*  ALT 347* 196*  ALKPHOS 111 91  BILITOT 0.9 0.8  PROT 7.3 6.4*  ALBUMIN 4.2 3.6    Recent Labs Lab 02/04/17 1228  LIPASE 37   CBC:  Recent Labs Lab 02/04/17 1228 02/05/17 0554  WBC 8.8 7.5  HGB 14.7 13.1  HCT 44.9 41.4  MCV 86.5 86.4  PLT 145* 181   Urinalysis    Component Value Date/Time   COLORURINE YELLOW 02/04/2017 1200   APPEARANCEUR CLEAR 02/04/2017 1200   LABSPEC 1.021 02/04/2017 1200   PHURINE 5.0 02/04/2017 1200   GLUCOSEU NEGATIVE  02/04/2017 1200   HGBUR NEGATIVE 02/04/2017 1200   BILIRUBINUR NEGATIVE 02/04/2017 1200   KETONESUR NEGATIVE 02/04/2017 1200   PROTEINUR NEGATIVE 02/04/2017 1200   NITRITE NEGATIVE 02/04/2017 1200   LEUKOCYTESUR NEGATIVE 02/04/2017 1200   Time coordinating discharge: Approximately 40 minutes  Vance Gather, MD  [facility] 02/05/2017, 9:31 AM Pager [phone]

## 2017-02-05 NOTE — Progress Notes (Signed)
PT Cancellation Note  Patient Details Name: Don Ruiz MRN: [id number] DOB: Jan 15, 1951   Cancelled Treatment:    Reason Eval/Treat Not Completed: Other (comment) (Patient discharged prior to PT evaluation)   Despina Pole 02/05/2017, 11:21 AM Carita Pian. Sanjuana Kava, [place] Pager [phone]

## 2018-02-21 ENCOUNTER — Emergency Department (HOSPITAL_COMMUNITY): Payer: 59

## 2018-02-21 ENCOUNTER — Encounter (HOSPITAL_COMMUNITY): Payer: Self-pay

## 2018-02-21 ENCOUNTER — Emergency Department (HOSPITAL_COMMUNITY)
Admission: EM | Admit: 2018-02-21 | Discharge: 2018-02-21 | Disposition: A | Discharge status: Home / Self Care | Payer: 59 | Attending: Emergency Medicine | Admitting: Emergency Medicine

## 2018-02-21 ENCOUNTER — Other Ambulatory Visit: Payer: Self-pay

## 2018-02-21 DIAGNOSIS — I4891 Unspecified atrial fibrillation: Secondary | ICD-10-CM

## 2018-02-21 DIAGNOSIS — R42 Dizziness and giddiness: Secondary | ICD-10-CM | POA: Diagnosis present

## 2018-02-21 DIAGNOSIS — I10 Essential (primary) hypertension: Secondary | ICD-10-CM | POA: Diagnosis not present

## 2018-02-21 DIAGNOSIS — I2510 Atherosclerotic heart disease of native coronary artery without angina pectoris: Secondary | ICD-10-CM | POA: Insufficient documentation

## 2018-02-21 DIAGNOSIS — Z86711 Personal history of pulmonary embolism: Secondary | ICD-10-CM | POA: Insufficient documentation

## 2018-02-21 DIAGNOSIS — Z79899 Other long term (current) drug therapy: Secondary | ICD-10-CM | POA: Diagnosis not present

## 2018-02-21 LAB — BASIC METABOLIC PANEL
Anion gap: 10 (ref 5–15)
BUN: 27 mg/dL — ABNORMAL HIGH (ref 6–20)
CALCIUM: 9 mg/dL (ref 8.9–10.3)
CHLORIDE: 108 mmol/L (ref 101–111)
CO2: 26 mmol/L (ref 22–32)
CREATININE: 1.37 mg/dL — AB (ref 0.61–1.24)
GFR, EST NON AFRICAN AMERICAN: 53 mL/min — AB (ref >60)
Glucose, Bld: 92 mg/dL (ref 65–99)
Potassium: 3.9 mmol/L (ref 3.5–5.1)
SODIUM: 144 mmol/L (ref 135–145)

## 2018-02-21 LAB — CBC
HCT: 45.4 % (ref 39.0–52.0)
Hemoglobin: 14.1 g/dL (ref 13.0–17.0)
MCH: 27.9 pg (ref 26.0–34.0)
MCHC: 31.1 g/dL (ref 30.0–36.0)
MCV: 89.9 fL (ref 78.0–100.0)
PLATELETS: 201 10*3/uL (ref 150–400)
RBC: 5.05 MIL/uL (ref 4.22–5.81)
RDW: 14.1 % (ref 11.5–15.5)
WBC: 8.7 10*3/uL (ref 4.0–10.5)

## 2018-02-21 LAB — PROTIME-INR
INR: 2.81
Prothrombin Time: 29.4 seconds — ABNORMAL HIGH (ref 11.4–15.2)

## 2018-02-21 LAB — I-STAT TROPONIN, ED: TROPONIN I, POC: 0 ng/mL (ref 0.00–0.08)

## 2018-02-21 LAB — MAGNESIUM: Magnesium: 2.2 mg/dL (ref 1.7–2.4)

## 2018-02-21 LAB — TSH: TSH: 2.54 u[IU]/mL (ref 0.350–4.500)

## 2018-02-21 MED ORDER — SODIUM CHLORIDE 0.9 % IV BOLUS
1000.0000 mL | Freq: Once | INTRAVENOUS | Status: AC
  Administered 2018-02-21: 1000 mL via INTRAVENOUS

## 2018-02-21 MED ORDER — MIDAZOLAM HCL 2 MG/2ML IJ SOLN
4.0000 mg | Freq: Once | INTRAMUSCULAR | Status: AC
  Administered 2018-02-21: 4 mg via INTRAVENOUS
  Filled 2018-02-21: qty 4

## 2018-02-21 NOTE — ED Notes (Signed)
Zoll pads placed 

## 2018-02-21 NOTE — ED Provider Notes (Signed)
Patient placed in Quick Look pathway, seen and evaluated   Chief Complaint: Lightheaded, heart pounding  HPI:   67 year old male presents in A. Fib with RVR. He reports symptoms started at 10:30 am today. He is on chronic anticoagulation from multiple PE's due to Factor V Leiden. He states he felt acutely lightheaded and his heart was pounding. He also has a feeling of indigestion but denies chest pain or SOB. He has a hx of atrial tachycardia years ago but has never had A.fib, CAD, CHF, HTN.   ROS: +lightheaded, heart racing  Physical Exam:   Gen: Mild distress from lightheadedness  Neuro: Awake and Alert  Skin: Warm    Focused Exam: Heart: Fast, irregularly irregular rhythm    Lungs: CTA    Abdomen: Soft, non-tender   Initiation of care has begun. The patient has been counseled on the process, plan, and necessity for staying for the completion/evaluation, and the remainder of the medical screening examination    Recardo Evangelist, PA-C 02/21/18 [place], Julie, MD 02/21/18 1551

## 2018-02-21 NOTE — ED Provider Notes (Signed)
.Cardioversion Date/Time: 02/21/2018 3:40 PM Performed by: Carmin Muskrat, MD Authorized by: Carmin Muskrat, MD   Consent:    Consent obtained:  Written   Risks discussed:  Cutaneous burn, induced arrhythmia and pain   Alternatives discussed:  No treatment, delayed treatment and alternative treatment Universal protocol:    Procedure explained and questions answered to patient or proxy's satisfaction: yes     Immediately prior to procedure a time out was called: yes     Patient identity confirmed:  Verbally with patient Pre-procedure details:    Cardioversion basis:  Emergent   Rhythm:  Atrial fibrillation   Electrode placement:  Anterior-posterior Patient sedated: Yes. Refer to sedation procedure documentation for details of sedation.  Attempt one:    Cardioversion mode:  Synchronous   Waveform:  Biphasic   Shock (Joules):  120   Shock outcome:  Conversion to normal sinus rhythm Post-procedure details:    Patient status:  Awake   Patient tolerance of procedure:  Tolerated well, no immediate complications .Sedation Date/Time: 02/21/2018 3:40 PM Performed by: Carmin Muskrat, MD Authorized by: Carmin Muskrat, MD   Consent:    Consent obtained:  Written   Consent given by:  Patient and spouse   Risks discussed:  Dysrhythmia, inadequate sedation and nausea Universal protocol:    Procedure explained and questions answered to patient or proxy's satisfaction: yes     Immediately prior to procedure a time out was called: yes     Patient identity confirmation method:  Verbally with patient Indications:    Procedure performed:  Cardioversion   Procedure necessitating sedation performed by:  Physician performing sedation   Intended level of sedation:  Moderate (conscious sedation) Pre-sedation assessment:    Time since last food or drink:  5   ASA classification: class 1 - normal, healthy patient     Neck mobility: normal     Mouth opening:  3 or more finger widths  Mallampati score:  I - soft palate, uvula, fauces, pillars visible   Pre-sedation assessments completed and reviewed: airway patency, cardiovascular function, hydration status, mental status, nausea/vomiting, pain level, respiratory function and temperature     Pre-sedation assessment completed:  02/21/2018 3:40 PM Immediate pre-procedure details:    Reassessment: Patient reassessed immediately prior to procedure     Reviewed: vital signs     Verified: bag valve mask available, emergency equipment available, intubation equipment available, IV patency confirmed and oxygen available   Procedure details (see MAR for exact dosages):    Preoxygenation:  Nasal cannula   Sedation:  Midazolam   Intra-procedure monitoring:  Blood pressure monitoring, cardiac monitor, continuous capnometry and continuous pulse oximetry   Intra-procedure events: none     Total Provider sedation time (minutes):  20 Post-procedure details:    Post-sedation assessment completed:  02/21/2018 4:15 PM   Attendance: Constant attendance by certified staff until patient recovered     Recovery: Patient returned to pre-procedure baseline     Post-sedation assessments completed and reviewed: airway patency, cardiovascular function, hydration status, mental status, nausea/vomiting, pain level, respiratory function and temperature     Patient is stable for discharge or admission: yes     Patient tolerance:  Tolerated well, no immediate complications   Patient was seen with the physician assistant. This gentleman was presenting with atrial fibrillation, rapid ventricular response, symptomatic. Patient had mild hypotension given his symptoms, history of same, he was a candidate for electrical cardioversion, which was performed without complication.   Carmin Muskrat, MD 02/21/18 1558

## 2018-02-21 NOTE — ED Provider Notes (Signed)
[facility] EMERGENCY DEPARTMENT Provider Note   CSN: [id number] Arrival date & time: 02/21/18  1411     History   Chief Complaint Chief Complaint  Patient presents with  . Atrial Fibrillation    HPI Benjimin Ruiz is a 67 y.o. male.  The history is provided by the patient and medical records. No language interpreter was used.  Atrial Fibrillation  Pertinent negatives include no chest pain.   Don Ruiz is a 67 y.o. male who presents to the Emergency Department complaining of abnormal sensation to chest which has been constant for the last 4 hours.  He reports similar sensation about 3 days ago that started while he was walking.  He describes this as feeling weak and his his heart was racing.  It lasted a few minutes and then self resolved.  He felt better the following day and was asymptomatic.  Around 10 AM this morning he felt as if his heart was "beating out of my chest" and was associated with lightheadedness.  He felt as if he may pass out, but had no syncopal episode.  He has a history of factor V [place] and is on chronic anticoagulation (warfarin).  He does have a history of SVT and possibly A. fib managed by [facility].  He had an ablation in 2014 and notes no arrhythmia problems since then.  Past Medical History:  Diagnosis Date  . Ankle impingement syndrome    Right ankle from bone spur  . Antiphospholipid antibody syndrome ([place]) 06/12/2012  . Coagulopathy ([place]) 06/12/2012  . DVT (deep venous thrombosis) ([place]) 1990, 2002   chronic hypercoagulobility.   . DVT, recurrent, lower extremity, chronic ([place]) 06/12/2012   1992  . Dysrhythmia    AF  . GERD (gastroesophageal reflux disease)   . H/O tinnitus   . Heterozygous factor V Leiden mutation ([place]) 06/12/2012  . Hyperlipidemia   . Migraine headache   . Protein S deficiency ([place]) 06/12/2012  . Prothrombin gene mutation ([place]) 06/12/2012  . Pulmonary embolism ([place])   . Pulmonary embolism, bilateral ([place])  06/12/2012   July, 2002  . PVC (premature ventricular contraction)   . Varicose veins 06/12/2012   RLE  . Wears hearing aid     Patient Active Problem List   Diagnosis Date Noted  . Elevated LFTs   . Acute epigastric pain 02/04/2017  . HTN (hypertension) 09/27/2013  . SNHL (sensorineural hearing loss) 05/24/2013  . CAD (coronary artery disease) 04/18/2013  . Chronic anticoagulation 04/18/2013  . GERD (gastroesophageal reflux disease) 04/18/2013  . History of pulmonary embolism 04/18/2013  . Blood clotting disorder ([place]) 06/12/2012  . Heterozygous factor V Leiden mutation ([place]) 06/12/2012  . Prothrombin gene mutation ([place]) 06/12/2012  . Antiphospholipid antibody syndrome ([place]) 06/12/2012  . Protein S deficiency ([place]) 06/12/2012  . DVT, recurrent, lower extremity, chronic ([place]) 06/12/2012  . Pulmonary embolism, bilateral ([place]) 06/12/2012  . Varicose veins 06/12/2012  . Chronic venous embolism and thrombosis of deep vessels of lower extremity ([place]) 06/12/2012  . Primary hypercoagulable state ([place]) 06/12/2012  . Abdominal aneurysm without mention of rupture 03/12/2012  . Dissection of aorta, abdominal ([place]) 03/12/2012  . ABNORMALITY OF GAIT 07/02/2009  . SPRAIN&STRAIN OTHER SPECIFIED SITES KNEE&LEG 07/02/2009    Past Surgical History:  Procedure Laterality Date  . ABDOMINAL AORTIC ANEURYSM REPAIR  12/14   [facility]  . ANKLE SURGERY  2005   right  . APPENDECTOMY    . ARTERY BIOPSY Left 08/12/2014   Procedure: LEFT TEMPORAL  ARTERY BIOPSY ;  Surgeon: Coralie Keens, MD;  Location: [facility];  Service: General;  Laterality: Left;  . CHOLECYSTECTOMY  2008   lap choli  . COLONOSCOPY N/A 09/03/2013   Procedure: COLONOSCOPY;  Surgeon: Garlan Fair, MD;  Location: [facility];  Service: Endoscopy;  Laterality: N/A;  . FINGER SURGERY     Right 1st finger,  by Dr. Sharol Given  . FINGER TENDON REPAIR  2013   left 5th finger by Dr. Caralyn Guile  . PATELLA RELEASE AND MANIPULATION   1992   Right Patella        Home Medications    Prior to Admission medications   Medication Sig Start Date End Date Taking? Authorizing Provider  celecoxib (CELEBREX) 200 MG capsule Take 200 mg by mouth daily as needed. For pain   Yes [provider]  diphenhydrAMINE (BENADRYL) 25 MG tablet Take 25 mg by mouth as needed for sleep.   Yes [provider]  diphenhydramine-acetaminophen (TYLENOL PM EXTRA STRENGTH) 25-500 MG TABS tablet Take 2 tablets by mouth at bedtime as needed (for sleep).   Yes [provider]  ezetimibe (ZETIA) 10 MG tablet Take 1 tablet by mouth daily. 01/27/17  Yes [provider]  gabapentin (NEURONTIN) 600 MG tablet Take 300-600 mg by mouth See admin instructions. 600 in the morning and 300 at night   Yes [provider]  Multiple Vitamin (MULITIVITAMIN WITH MINERALS) TABS Take 1 tablet by mouth daily.   Yes [provider]  pravastatin (PRAVACHOL) 80 MG tablet Take 80 mg by mouth daily.   Yes [provider]  psyllium (METAMUCIL) 58.6 % packet Take 1 packet by mouth daily.   Yes [provider]  warfarin (JANTOVEN) 5 MG tablet Take 5 mg by mouth daily. 5 mg every day.   Yes [provider]  HYDROcodone-acetaminophen (NORCO) 5-325 MG per tablet Take 1-2 tablets by mouth every 4 (four) hours as needed. Patient not taking: Reported on 02/21/2018 08/12/14   Coralie Keens, MD    Family History Family History  Problem Relation Age of Onset  . Cancer Mother        Breast cancer  . Hypertension Mother   . Diabetes Mother   . Fibromyalgia Mother   . Stroke Mother   . Stroke Father   . Heart disease Father   . Alcohol abuse Sister   . Hyperlipidemia Sister   . Hypertension Sister   . Heart disease Brother        CABG at 12  . Stroke Brother   . Pulmonary embolism Brother   . Heart disease Other   . Hyperlipidemia Other   . Heart disease Brother        MI at 38 CABG  . Heart  disease Brother        CABG,   MI at 59  . Stroke Sister 59  . Heart disease Sister   . Cancer Sister        Breast Cancer  . Other Sister        acoustic tumor  . Deep vein thrombosis Sister     Social History Social History   Tobacco Use  . Smoking status: Never Smoker  . Smokeless tobacco: Never Used  Substance Use Topics  . Alcohol use: No  . Drug use: No     Allergies   Niacin and related; Phisohex [hexachlorophene]; and Topamax [topiramate]   Review of Systems Review of Systems  Cardiovascular: Positive for  palpitations. Negative for chest pain and leg swelling.  Neurological: Positive for weakness.  All other systems reviewed and are negative.    Physical Exam Updated Vital Signs BP 118/89   Pulse 62   Temp 98.1 F (36.7 C) (Oral)   Resp 14   SpO2 98%   Physical Exam  Constitutional: He is oriented to person, place, and time. He appears well-developed and well-nourished. No distress.  HENT:  Head: Normocephalic and atraumatic.  Cardiovascular: Normal heart sounds.  No murmur heard. Tachycardic. HR between 130's-160's on cardiac monitoring.  Pulmonary/Chest: Effort normal and breath sounds normal. No respiratory distress. He has no wheezes. He has no rales.  Abdominal: Soft. He exhibits no distension. There is no tenderness.  Musculoskeletal: He exhibits no edema.  Neurological: He is alert and oriented to person, place, and time.  Skin: Skin is warm and dry.  Nursing note and vitals reviewed.    ED Treatments / Results  Labs (all labs ordered are listed, but only abnormal results are displayed) Labs Reviewed  BASIC METABOLIC PANEL - Abnormal; Notable for the following components:      Result Value   BUN 27 (*)    Creatinine, Ser 1.37 (*)    GFR calc non Af Amer 53 (*)    All other components within normal limits  PROTIME-INR - Abnormal; Notable for the following components:   Prothrombin Time 29.4 (*)    All other components within normal  limits  CBC  TSH  MAGNESIUM  I-STAT TROPONIN, ED    EKG EKG Interpretation  Date/Time:  Wednesday February 21 2018 14:15:50 EDT Ventricular Rate:  162 PR Interval:    QRS Duration: 94 QT Interval:  300 QTC Calculation: 492 R Axis:   -89 Text Interpretation:  Atrial fibrillation with rapid ventricular response Left axis deviation Pulmonary disease pattern Incomplete right bundle branch block Nonspecific ST abnormality Abnormal ECG Abnormal ekg Confirmed by Carmin Muskrat [id number]) on 02/21/2018 2:51:32 PM Also confirmed by Carmin Muskrat ([id number]), editor [place], Jeannetta Nap [phone])  on 02/21/2018 3:52:28 PM   Radiology Dg Chest Portable 1 View  Result Date: 02/21/2018 CLINICAL DATA:  Shortness of breath EXAM: PORTABLE CHEST 1 VIEW COMPARISON:  02/04/2017 FINDINGS: Borderline heart size for technique. Negative aortic and hilar contours. There is no edema, consolidation, effusion, or pneumothorax. Artifact from EKG leads and defibrillator pads. IMPRESSION: No evidence of active disease. Electronically Signed   By: Monte Fantasia M.D.   On: 02/21/2018 16:30    Procedures Procedures (including critical care time)  Medications Ordered in ED Medications  midazolam (VERSED) injection 4 mg (4 mg Intravenous Given 02/21/18 1544)  sodium chloride 0.9 % bolus 1,000 mL (1,000 mLs Intravenous New Bag/Given 02/21/18 1554)     Initial Impression / Assessment and Plan / ED Course  I have reviewed the triage vital signs and the nursing notes.  Pertinent labs & imaging results that were available during my care of the patient were reviewed by me and considered in my medical decision making (see chart for details).    Don Ruiz is a 67 y.o. male who presents to ED from PCP in afib with RVR.  He is complaining of palpitations and weakness which began at 10 AM this morning. Presented to ED in afib with RVR. He is on chronic anticoagulation with therapeutic INR. Sedation and cardioversion performed by  attending, Dr. Vanita Panda. Patient converted to sinus rhythm. Observed in ED. Feels well and no longer symptomatic. Referred to a. Fib clinic. Return  precautions discussed and all questions answered.  Patient seen by and discussed with Dr. Vanita Panda who agrees with treatment plan.    Final Clinical Impressions(s) / ED Diagnoses   Final diagnoses:  Atrial fibrillation with RVR [facility])    ED Discharge Orders        Ordered    Amb referral to AFIB Clinic     02/21/18 1508       Ward, Ozella Almond, PA-C 02/21/18 1659    Carmin Muskrat, MD 02/22/18 2133

## 2018-02-21 NOTE — ED Triage Notes (Signed)
Pt presents for evaluation of afib rvr from PCP. Pt reports "thumping" sensation and burning to chest since Sunday. Pt reports lightheadedness.

## 2018-02-21 NOTE — Discharge Instructions (Signed)
It was my pleasure taking care of you today!   Please call your cardiologist to inform them of today's visit. I have also referred you to the A. Fib clinic here in town as we discussed. I would like you to see either our clinic or your cardiologist in the next couple days.   Return to ER for return of symptoms, new symptoms, any additional concerns.

## 2018-02-22 ENCOUNTER — Telehealth (HOSPITAL_COMMUNITY): Payer: Self-pay | Admitting: *Deleted

## 2018-02-22 NOTE — Telephone Encounter (Signed)
LMOM for pt to [facility]bk to sched appt.  Pt referred from ED

## 2018-05-18 DIAGNOSIS — Z7901 Long term (current) use of anticoagulants: Secondary | ICD-10-CM | POA: Diagnosis not present

## 2018-06-15 DIAGNOSIS — Z7901 Long term (current) use of anticoagulants: Secondary | ICD-10-CM | POA: Diagnosis not present

## 2018-06-15 DIAGNOSIS — I499 Cardiac arrhythmia, unspecified: Secondary | ICD-10-CM | POA: Diagnosis not present

## 2018-07-16 NOTE — Progress Notes (Signed)
Mr. Roskos received his flu shot today at the [facility] to his LT deltoid by Johny Shock, RN [phone] OFP:[id number] RVA:[id number] Exp:03/05/19

## 2018-07-18 DIAGNOSIS — D126 Benign neoplasm of colon, unspecified: Secondary | ICD-10-CM | POA: Diagnosis not present

## 2018-07-18 DIAGNOSIS — R5383 Other fatigue: Secondary | ICD-10-CM | POA: Diagnosis not present

## 2018-07-18 DIAGNOSIS — R400 Somnolence: Secondary | ICD-10-CM | POA: Diagnosis not present

## 2018-07-18 DIAGNOSIS — I480 Paroxysmal atrial fibrillation: Secondary | ICD-10-CM | POA: Diagnosis not present

## 2018-07-18 DIAGNOSIS — Z7901 Long term (current) use of anticoagulants: Secondary | ICD-10-CM | POA: Diagnosis not present

## 2018-07-27 DIAGNOSIS — I480 Paroxysmal atrial fibrillation: Secondary | ICD-10-CM | POA: Diagnosis not present

## 2018-07-27 DIAGNOSIS — D6851 Activated protein C resistance: Secondary | ICD-10-CM | POA: Diagnosis not present

## 2018-07-27 DIAGNOSIS — I82509 Chronic embolism and thrombosis of unspecified deep veins of unspecified lower extremity: Secondary | ICD-10-CM | POA: Diagnosis not present

## 2018-07-27 DIAGNOSIS — I2510 Atherosclerotic heart disease of native coronary artery without angina pectoris: Secondary | ICD-10-CM | POA: Diagnosis not present

## 2018-07-27 DIAGNOSIS — I10 Essential (primary) hypertension: Secondary | ICD-10-CM | POA: Diagnosis not present

## 2018-08-14 DIAGNOSIS — M79641 Pain in right hand: Secondary | ICD-10-CM | POA: Diagnosis not present

## 2018-08-15 DIAGNOSIS — I249 Acute ischemic heart disease, unspecified: Secondary | ICD-10-CM | POA: Diagnosis not present

## 2018-08-15 DIAGNOSIS — R079 Chest pain, unspecified: Secondary | ICD-10-CM | POA: Diagnosis not present

## 2018-08-15 DIAGNOSIS — I2510 Atherosclerotic heart disease of native coronary artery without angina pectoris: Secondary | ICD-10-CM | POA: Diagnosis not present

## 2018-08-21 DIAGNOSIS — D122 Benign neoplasm of ascending colon: Secondary | ICD-10-CM | POA: Diagnosis not present

## 2018-08-21 DIAGNOSIS — K5730 Diverticulosis of large intestine without perforation or abscess without bleeding: Secondary | ICD-10-CM | POA: Diagnosis not present

## 2018-08-21 DIAGNOSIS — Z86010 Personal history of colonic polyps: Secondary | ICD-10-CM | POA: Diagnosis not present

## 2018-08-21 DIAGNOSIS — K648 Other hemorrhoids: Secondary | ICD-10-CM | POA: Diagnosis not present

## 2018-08-24 DIAGNOSIS — D122 Benign neoplasm of ascending colon: Secondary | ICD-10-CM | POA: Diagnosis not present

## 2018-08-27 DIAGNOSIS — Z7901 Long term (current) use of anticoagulants: Secondary | ICD-10-CM | POA: Diagnosis not present

## 2018-08-30 DIAGNOSIS — Z7901 Long term (current) use of anticoagulants: Secondary | ICD-10-CM | POA: Diagnosis not present

## 2018-09-14 DIAGNOSIS — Z7901 Long term (current) use of anticoagulants: Secondary | ICD-10-CM | POA: Diagnosis not present

## 2018-11-02 DIAGNOSIS — I2510 Atherosclerotic heart disease of native coronary artery without angina pectoris: Secondary | ICD-10-CM | POA: Diagnosis not present

## 2018-11-06 DIAGNOSIS — L0889 Other specified local infections of the skin and subcutaneous tissue: Secondary | ICD-10-CM | POA: Diagnosis not present

## 2018-11-06 DIAGNOSIS — Z85820 Personal history of malignant melanoma of skin: Secondary | ICD-10-CM | POA: Diagnosis not present

## 2018-11-06 DIAGNOSIS — Z7901 Long term (current) use of anticoagulants: Secondary | ICD-10-CM | POA: Diagnosis not present

## 2018-11-06 DIAGNOSIS — L0109 Other impetigo: Secondary | ICD-10-CM | POA: Diagnosis not present

## 2018-12-05 DIAGNOSIS — Z7901 Long term (current) use of anticoagulants: Secondary | ICD-10-CM | POA: Diagnosis not present

## 2019-01-02 DIAGNOSIS — Z7901 Long term (current) use of anticoagulants: Secondary | ICD-10-CM | POA: Diagnosis not present

## 2019-01-30 DIAGNOSIS — Z7901 Long term (current) use of anticoagulants: Secondary | ICD-10-CM | POA: Diagnosis not present

## 2019-02-04 DIAGNOSIS — M79662 Pain in left lower leg: Secondary | ICD-10-CM | POA: Diagnosis not present

## 2019-02-15 DIAGNOSIS — I2510 Atherosclerotic heart disease of native coronary artery without angina pectoris: Secondary | ICD-10-CM | POA: Diagnosis not present

## 2019-02-15 DIAGNOSIS — Z86718 Personal history of other venous thrombosis and embolism: Secondary | ICD-10-CM | POA: Diagnosis not present

## 2019-02-15 DIAGNOSIS — Z1389 Encounter for screening for other disorder: Secondary | ICD-10-CM | POA: Diagnosis not present

## 2019-02-15 DIAGNOSIS — G43009 Migraine without aura, not intractable, without status migrainosus: Secondary | ICD-10-CM | POA: Diagnosis not present

## 2019-02-15 DIAGNOSIS — E7800 Pure hypercholesterolemia, unspecified: Secondary | ICD-10-CM | POA: Diagnosis not present

## 2019-02-15 DIAGNOSIS — I480 Paroxysmal atrial fibrillation: Secondary | ICD-10-CM | POA: Diagnosis not present

## 2019-02-15 DIAGNOSIS — Z7901 Long term (current) use of anticoagulants: Secondary | ICD-10-CM | POA: Diagnosis not present

## 2019-02-15 DIAGNOSIS — S76312D Strain of muscle, fascia and tendon of the posterior muscle group at thigh level, left thigh, subsequent encounter: Secondary | ICD-10-CM | POA: Diagnosis not present

## 2019-02-15 DIAGNOSIS — Z0000 Encounter for general adult medical examination without abnormal findings: Secondary | ICD-10-CM | POA: Diagnosis not present

## 2019-02-15 DIAGNOSIS — Z23 Encounter for immunization: Secondary | ICD-10-CM | POA: Diagnosis not present

## 2019-03-15 DIAGNOSIS — Z7901 Long term (current) use of anticoagulants: Secondary | ICD-10-CM | POA: Diagnosis not present

## 2019-03-27 DIAGNOSIS — L570 Actinic keratosis: Secondary | ICD-10-CM | POA: Diagnosis not present

## 2019-03-27 DIAGNOSIS — D1801 Hemangioma of skin and subcutaneous tissue: Secondary | ICD-10-CM | POA: Diagnosis not present

## 2019-03-27 DIAGNOSIS — D2271 Melanocytic nevi of right lower limb, including hip: Secondary | ICD-10-CM | POA: Diagnosis not present

## 2019-03-27 DIAGNOSIS — D2261 Melanocytic nevi of right upper limb, including shoulder: Secondary | ICD-10-CM | POA: Diagnosis not present

## 2019-03-27 DIAGNOSIS — Z85820 Personal history of malignant melanoma of skin: Secondary | ICD-10-CM | POA: Diagnosis not present

## 2019-03-27 DIAGNOSIS — D2262 Melanocytic nevi of left upper limb, including shoulder: Secondary | ICD-10-CM | POA: Diagnosis not present

## 2019-03-27 DIAGNOSIS — D225 Melanocytic nevi of trunk: Secondary | ICD-10-CM | POA: Diagnosis not present

## 2019-03-27 DIAGNOSIS — L821 Other seborrheic keratosis: Secondary | ICD-10-CM | POA: Diagnosis not present

## 2019-03-27 DIAGNOSIS — D2272 Melanocytic nevi of left lower limb, including hip: Secondary | ICD-10-CM | POA: Diagnosis not present

## 2019-04-01 DIAGNOSIS — R0789 Other chest pain: Secondary | ICD-10-CM | POA: Diagnosis not present

## 2019-04-01 DIAGNOSIS — I2510 Atherosclerotic heart disease of native coronary artery without angina pectoris: Secondary | ICD-10-CM | POA: Diagnosis not present

## 2019-04-01 DIAGNOSIS — M316 Other giant cell arteritis: Secondary | ICD-10-CM | POA: Diagnosis not present

## 2019-04-01 DIAGNOSIS — R0600 Dyspnea, unspecified: Secondary | ICD-10-CM | POA: Diagnosis not present

## 2019-04-01 DIAGNOSIS — D6851 Activated protein C resistance: Secondary | ICD-10-CM | POA: Diagnosis not present

## 2019-04-01 DIAGNOSIS — I471 Supraventricular tachycardia: Secondary | ICD-10-CM | POA: Diagnosis not present

## 2019-04-01 DIAGNOSIS — K219 Gastro-esophageal reflux disease without esophagitis: Secondary | ICD-10-CM | POA: Diagnosis not present

## 2019-04-01 DIAGNOSIS — I480 Paroxysmal atrial fibrillation: Secondary | ICD-10-CM | POA: Diagnosis not present

## 2019-04-01 DIAGNOSIS — I517 Cardiomegaly: Secondary | ICD-10-CM | POA: Diagnosis not present

## 2019-04-01 DIAGNOSIS — D6859 Other primary thrombophilia: Secondary | ICD-10-CM | POA: Diagnosis not present

## 2019-04-01 DIAGNOSIS — I10 Essential (primary) hypertension: Secondary | ICD-10-CM | POA: Diagnosis not present

## 2019-04-12 DIAGNOSIS — Z7901 Long term (current) use of anticoagulants: Secondary | ICD-10-CM | POA: Diagnosis not present

## 2019-05-14 DIAGNOSIS — Z7901 Long term (current) use of anticoagulants: Secondary | ICD-10-CM | POA: Diagnosis not present

## 2019-05-31 DIAGNOSIS — H43812 Vitreous degeneration, left eye: Secondary | ICD-10-CM | POA: Diagnosis not present

## 2019-05-31 DIAGNOSIS — Z961 Presence of intraocular lens: Secondary | ICD-10-CM | POA: Diagnosis not present

## 2019-06-11 DIAGNOSIS — Z7901 Long term (current) use of anticoagulants: Secondary | ICD-10-CM | POA: Diagnosis not present

## 2019-06-14 DIAGNOSIS — M79644 Pain in right finger(s): Secondary | ICD-10-CM | POA: Diagnosis not present

## 2019-06-18 DIAGNOSIS — M79644 Pain in right finger(s): Secondary | ICD-10-CM | POA: Diagnosis not present

## 2019-07-09 DIAGNOSIS — Z7901 Long term (current) use of anticoagulants: Secondary | ICD-10-CM | POA: Diagnosis not present

## 2019-08-07 DIAGNOSIS — Z7901 Long term (current) use of anticoagulants: Secondary | ICD-10-CM | POA: Diagnosis not present

## 2019-08-16 DIAGNOSIS — J0100 Acute maxillary sinusitis, unspecified: Secondary | ICD-10-CM | POA: Diagnosis not present

## 2019-10-02 DIAGNOSIS — Z7901 Long term (current) use of anticoagulants: Secondary | ICD-10-CM | POA: Diagnosis not present

## 2019-11-01 DIAGNOSIS — Z7901 Long term (current) use of anticoagulants: Secondary | ICD-10-CM | POA: Diagnosis not present

## 2019-11-29 DIAGNOSIS — Z7901 Long term (current) use of anticoagulants: Secondary | ICD-10-CM | POA: Diagnosis not present

## 2019-12-27 DIAGNOSIS — Z7901 Long term (current) use of anticoagulants: Secondary | ICD-10-CM | POA: Diagnosis not present

## 2020-01-24 DIAGNOSIS — Z7901 Long term (current) use of anticoagulants: Secondary | ICD-10-CM | POA: Diagnosis not present

## 2020-02-12 DIAGNOSIS — Z23 Encounter for immunization: Secondary | ICD-10-CM | POA: Diagnosis not present

## 2020-02-26 DIAGNOSIS — E7800 Pure hypercholesterolemia, unspecified: Secondary | ICD-10-CM | POA: Diagnosis not present

## 2020-02-26 DIAGNOSIS — Z1389 Encounter for screening for other disorder: Secondary | ICD-10-CM | POA: Diagnosis not present

## 2020-02-26 DIAGNOSIS — Z86718 Personal history of other venous thrombosis and embolism: Secondary | ICD-10-CM | POA: Diagnosis not present

## 2020-02-26 DIAGNOSIS — Z23 Encounter for immunization: Secondary | ICD-10-CM | POA: Diagnosis not present

## 2020-02-26 DIAGNOSIS — I480 Paroxysmal atrial fibrillation: Secondary | ICD-10-CM | POA: Diagnosis not present

## 2020-02-26 DIAGNOSIS — Z86711 Personal history of pulmonary embolism: Secondary | ICD-10-CM | POA: Diagnosis not present

## 2020-02-26 DIAGNOSIS — Z7901 Long term (current) use of anticoagulants: Secondary | ICD-10-CM | POA: Diagnosis not present

## 2020-02-26 DIAGNOSIS — Z0000 Encounter for general adult medical examination without abnormal findings: Secondary | ICD-10-CM | POA: Diagnosis not present

## 2020-02-26 DIAGNOSIS — Z125 Encounter for screening for malignant neoplasm of prostate: Secondary | ICD-10-CM | POA: Diagnosis not present

## 2020-02-26 DIAGNOSIS — I2510 Atherosclerotic heart disease of native coronary artery without angina pectoris: Secondary | ICD-10-CM | POA: Diagnosis not present

## 2020-02-26 DIAGNOSIS — G43009 Migraine without aura, not intractable, without status migrainosus: Secondary | ICD-10-CM | POA: Diagnosis not present

## 2020-02-26 DIAGNOSIS — Z1159 Encounter for screening for other viral diseases: Secondary | ICD-10-CM | POA: Diagnosis not present

## 2020-03-27 DIAGNOSIS — L821 Other seborrheic keratosis: Secondary | ICD-10-CM | POA: Diagnosis not present

## 2020-03-27 DIAGNOSIS — D2272 Melanocytic nevi of left lower limb, including hip: Secondary | ICD-10-CM | POA: Diagnosis not present

## 2020-03-27 DIAGNOSIS — L738 Other specified follicular disorders: Secondary | ICD-10-CM | POA: Diagnosis not present

## 2020-03-27 DIAGNOSIS — D225 Melanocytic nevi of trunk: Secondary | ICD-10-CM | POA: Diagnosis not present

## 2020-03-27 DIAGNOSIS — Z85820 Personal history of malignant melanoma of skin: Secondary | ICD-10-CM | POA: Diagnosis not present

## 2020-03-27 DIAGNOSIS — D1801 Hemangioma of skin and subcutaneous tissue: Secondary | ICD-10-CM | POA: Diagnosis not present

## 2020-04-06 DIAGNOSIS — D6851 Activated protein C resistance: Secondary | ICD-10-CM | POA: Diagnosis not present

## 2020-04-06 DIAGNOSIS — I82509 Chronic embolism and thrombosis of unspecified deep veins of unspecified lower extremity: Secondary | ICD-10-CM | POA: Diagnosis not present

## 2020-04-06 DIAGNOSIS — I10 Essential (primary) hypertension: Secondary | ICD-10-CM | POA: Diagnosis not present

## 2020-04-06 DIAGNOSIS — K219 Gastro-esophageal reflux disease without esophagitis: Secondary | ICD-10-CM | POA: Diagnosis not present

## 2020-04-06 DIAGNOSIS — R0789 Other chest pain: Secondary | ICD-10-CM | POA: Diagnosis not present

## 2020-04-06 DIAGNOSIS — I480 Paroxysmal atrial fibrillation: Secondary | ICD-10-CM | POA: Diagnosis not present

## 2020-04-06 DIAGNOSIS — Z86711 Personal history of pulmonary embolism: Secondary | ICD-10-CM | POA: Diagnosis not present

## 2020-04-06 DIAGNOSIS — R0600 Dyspnea, unspecified: Secondary | ICD-10-CM | POA: Diagnosis not present

## 2020-04-06 DIAGNOSIS — I2510 Atherosclerotic heart disease of native coronary artery without angina pectoris: Secondary | ICD-10-CM | POA: Diagnosis not present

## 2020-04-06 DIAGNOSIS — D6859 Other primary thrombophilia: Secondary | ICD-10-CM | POA: Diagnosis not present

## 2020-04-06 DIAGNOSIS — I517 Cardiomegaly: Secondary | ICD-10-CM | POA: Diagnosis not present

## 2020-04-08 DIAGNOSIS — Z7901 Long term (current) use of anticoagulants: Secondary | ICD-10-CM | POA: Diagnosis not present

## 2020-06-01 DIAGNOSIS — Z7901 Long term (current) use of anticoagulants: Secondary | ICD-10-CM | POA: Diagnosis not present

## 2020-07-01 DIAGNOSIS — Z7901 Long term (current) use of anticoagulants: Secondary | ICD-10-CM | POA: Diagnosis not present

## 2020-07-29 DIAGNOSIS — Z7901 Long term (current) use of anticoagulants: Secondary | ICD-10-CM | POA: Diagnosis not present

## 2020-08-24 DIAGNOSIS — L02821 Furuncle of head [any part, except face]: Secondary | ICD-10-CM | POA: Diagnosis not present

## 2020-08-24 DIAGNOSIS — Z85820 Personal history of malignant melanoma of skin: Secondary | ICD-10-CM | POA: Diagnosis not present

## 2020-08-26 DIAGNOSIS — Z7901 Long term (current) use of anticoagulants: Secondary | ICD-10-CM | POA: Diagnosis not present

## 2020-08-30 DIAGNOSIS — R519 Headache, unspecified: Secondary | ICD-10-CM | POA: Diagnosis not present

## 2020-08-30 DIAGNOSIS — Z6827 Body mass index (BMI) 27.0-27.9, adult: Secondary | ICD-10-CM | POA: Diagnosis not present

## 2020-08-30 DIAGNOSIS — R059 Cough, unspecified: Secondary | ICD-10-CM | POA: Diagnosis not present

## 2021-02-15 ENCOUNTER — Other Ambulatory Visit: Payer: Self-pay | Admitting: Orthopaedic Surgery

## 2021-02-15 ENCOUNTER — Other Ambulatory Visit: Payer: Self-pay | Admitting: Orthopedic Surgery

## 2021-02-15 DIAGNOSIS — M25552 Pain in left hip: Secondary | ICD-10-CM

## 2021-02-16 ENCOUNTER — Ambulatory Visit
Admission: RE | Admit: 2021-02-16 | Discharge: 2021-02-16 | Disposition: A | Discharge status: Home / Self Care | Payer: 59 | Source: Ambulatory Visit | Attending: Orthopedic Surgery | Admitting: Orthopedic Surgery

## 2021-02-16 ENCOUNTER — Other Ambulatory Visit: Payer: 59

## 2021-02-16 DIAGNOSIS — M25552 Pain in left hip: Secondary | ICD-10-CM

## 2021-02-17 ENCOUNTER — Encounter (HOSPITAL_COMMUNITY): Payer: Self-pay | Admitting: Orthopedic Surgery

## 2021-02-17 ENCOUNTER — Other Ambulatory Visit: Payer: Self-pay | Admitting: Orthopedic Surgery

## 2021-02-17 ENCOUNTER — Ambulatory Visit (HOSPITAL_COMMUNITY): Payer: Medicare Other | Admitting: Registered Nurse

## 2021-02-17 ENCOUNTER — Ambulatory Visit (HOSPITAL_COMMUNITY)
Admission: RE | Admit: 2021-02-17 | Discharge: 2021-02-17 | Disposition: A | Discharge status: Home / Self Care | Payer: Medicare Other | Source: Other Acute Inpatient Hospital | Attending: Orthopedic Surgery | Admitting: Orthopedic Surgery

## 2021-02-17 ENCOUNTER — Encounter (HOSPITAL_COMMUNITY)
Admission: RE | Disposition: A | Discharge status: Home / Self Care | Payer: Self-pay | Source: Other Acute Inpatient Hospital | Attending: Orthopedic Surgery

## 2021-02-17 DIAGNOSIS — Z79899 Other long term (current) drug therapy: Secondary | ICD-10-CM | POA: Diagnosis not present

## 2021-02-17 DIAGNOSIS — X58XXXA Exposure to other specified factors, initial encounter: Secondary | ICD-10-CM | POA: Insufficient documentation

## 2021-02-17 DIAGNOSIS — D6851 Activated protein C resistance: Secondary | ICD-10-CM | POA: Diagnosis not present

## 2021-02-17 DIAGNOSIS — Z7901 Long term (current) use of anticoagulants: Secondary | ICD-10-CM | POA: Insufficient documentation

## 2021-02-17 DIAGNOSIS — Z832 Family history of diseases of the blood and blood-forming organs and certain disorders involving the immune mechanism: Secondary | ICD-10-CM | POA: Diagnosis not present

## 2021-02-17 DIAGNOSIS — S7012XA Contusion of left thigh, initial encounter: Secondary | ICD-10-CM | POA: Insufficient documentation

## 2021-02-17 DIAGNOSIS — Z86711 Personal history of pulmonary embolism: Secondary | ICD-10-CM | POA: Diagnosis not present

## 2021-02-17 DIAGNOSIS — Z86718 Personal history of other venous thrombosis and embolism: Secondary | ICD-10-CM | POA: Diagnosis not present

## 2021-02-17 HISTORY — PX: INCISION AND DRAINAGE HIP: SHX1801

## 2021-02-17 LAB — BASIC METABOLIC PANEL
Anion gap: 8 (ref 5–15)
BUN: 32 mg/dL — ABNORMAL HIGH (ref 8–23)
CO2: 26 mmol/L (ref 22–32)
Calcium: 9 mg/dL (ref 8.9–10.3)
Chloride: 105 mmol/L (ref 98–111)
Creatinine, Ser: 1.18 mg/dL (ref 0.61–1.24)
GFR, Estimated: >60 mL/min (ref >60)
Glucose, Bld: 95 mg/dL (ref 70–99)
Potassium: 4.1 mmol/L (ref 3.5–5.1)
Sodium: 139 mmol/L (ref 135–145)

## 2021-02-17 LAB — CBC
HCT: 40.5 % (ref 39.0–52.0)
Hemoglobin: 12.8 g/dL — ABNORMAL LOW (ref 13.0–17.0)
MCH: 28.7 pg (ref 26.0–34.0)
MCHC: 31.6 g/dL (ref 30.0–36.0)
MCV: 90.8 fL (ref 80.0–100.0)
Platelets: 280 10*3/uL (ref 150–400)
RBC: 4.46 MIL/uL (ref 4.22–5.81)
RDW: 13.7 % (ref 11.5–15.5)
WBC: 11.3 10*3/uL — ABNORMAL HIGH (ref 4.0–10.5)
nRBC: 0 % (ref 0.0–0.2)

## 2021-02-17 LAB — PROTIME-INR
INR: 1.8 — ABNORMAL HIGH (ref 0.8–1.2)
Prothrombin Time: 20.8 seconds — ABNORMAL HIGH (ref 11.4–15.2)

## 2021-02-17 SURGERY — IRRIGATION AND DEBRIDEMENT HIP
Anesthesia: General | Site: Thigh | Laterality: Left

## 2021-02-17 MED ORDER — ACETAMINOPHEN 10 MG/ML IV SOLN: 1000.0000 mg | Freq: Once | INTRAVENOUS | Status: DC | PRN

## 2021-02-17 MED ORDER — FENTANYL CITRATE (PF) 100 MCG/2ML IJ SOLN
INTRAMUSCULAR | Status: DC | PRN
  Administered 2021-02-17: 50 ug via INTRAVENOUS
  Administered 2021-02-17: 100 ug via INTRAVENOUS
  Administered 2021-02-17 (×2): 50 ug via INTRAVENOUS

## 2021-02-17 MED ORDER — MIDAZOLAM HCL 5 MG/5ML IJ SOLN
INTRAMUSCULAR | Status: DC | PRN
  Administered 2021-02-17: 2 mg via INTRAVENOUS

## 2021-02-17 MED ORDER — DEXAMETHASONE SODIUM PHOSPHATE 10 MG/ML IJ SOLN
INTRAMUSCULAR | Status: DC | PRN
  Administered 2021-02-17: 10 mg via INTRAVENOUS

## 2021-02-17 MED ORDER — ONDANSETRON HCL 4 MG/2ML IJ SOLN
INTRAMUSCULAR | Status: AC
  Filled 2021-02-17: qty 2

## 2021-02-17 MED ORDER — ACETAMINOPHEN 160 MG/5ML PO SOLN: 325.0000 mg | ORAL | Status: DC | PRN

## 2021-02-17 MED ORDER — OXYCODONE-ACETAMINOPHEN 5-325 MG PO TABS: 1.0000 | ORAL_TABLET | Freq: Four times a day (QID) | ORAL | 0 refills | Status: DC | PRN

## 2021-02-17 MED ORDER — MEPERIDINE HCL 50 MG/ML IJ SOLN: 6.2500 mg | INTRAMUSCULAR | Status: DC | PRN

## 2021-02-17 MED ORDER — ONDANSETRON HCL 4 MG/2ML IJ SOLN: 4.0000 mg | Freq: Once | INTRAMUSCULAR | Status: DC | PRN

## 2021-02-17 MED ORDER — PROPOFOL 10 MG/ML IV BOLUS
INTRAVENOUS | Status: AC
  Filled 2021-02-17: qty 20

## 2021-02-17 MED ORDER — PROPOFOL 500 MG/50ML IV EMUL
INTRAVENOUS | Status: AC
  Filled 2021-02-17: qty 50

## 2021-02-17 MED ORDER — FENTANYL CITRATE (PF) 250 MCG/5ML IJ SOLN
INTRAMUSCULAR | Status: AC
  Filled 2021-02-17: qty 5

## 2021-02-17 MED ORDER — CEFAZOLIN SODIUM-DEXTROSE 2-4 GM/100ML-% IV SOLN
2.0000 g | INTRAVENOUS | Status: AC
  Administered 2021-02-17: 2 g via INTRAVENOUS
  Filled 2021-02-17: qty 100

## 2021-02-17 MED ORDER — DEXAMETHASONE SODIUM PHOSPHATE 10 MG/ML IJ SOLN
INTRAMUSCULAR | Status: AC
  Filled 2021-02-17: qty 1

## 2021-02-17 MED ORDER — LACTATED RINGERS IV SOLN: INTRAVENOUS | Status: DC

## 2021-02-17 MED ORDER — LIDOCAINE 2% (20 MG/ML) 5 ML SYRINGE
INTRAMUSCULAR | Status: DC | PRN
  Administered 2021-02-17: 100 mg via INTRAVENOUS

## 2021-02-17 MED ORDER — PROPOFOL 10 MG/ML IV BOLUS
INTRAVENOUS | Status: DC | PRN
  Administered 2021-02-17: 20 mg via INTRAVENOUS
  Administered 2021-02-17: 180 mg via INTRAVENOUS

## 2021-02-17 MED ORDER — ACETAMINOPHEN 325 MG PO TABS: 325.0000 mg | ORAL_TABLET | ORAL | Status: DC | PRN

## 2021-02-17 MED ORDER — OXYCODONE HCL 5 MG PO TABS
ORAL_TABLET | ORAL | Status: AC
  Administered 2021-02-17: 5 mg via ORAL
  Filled 2021-02-17: qty 1

## 2021-02-17 MED ORDER — ONDANSETRON HCL 4 MG/2ML IJ SOLN
INTRAMUSCULAR | Status: DC | PRN
  Administered 2021-02-17: 4 mg via INTRAVENOUS

## 2021-02-17 MED ORDER — CEPHALEXIN 500 MG PO CAPS: 500.0000 mg | ORAL_CAPSULE | Freq: Three times a day (TID) | ORAL | 0 refills | Status: AC

## 2021-02-17 MED ORDER — OXYCODONE HCL 5 MG PO TABS: 5.0000 mg | ORAL_TABLET | Freq: Once | ORAL | Status: AC | PRN

## 2021-02-17 MED ORDER — TRANEXAMIC ACID 1000 MG/10ML IV SOLN
2000.0000 mg | Freq: Once | INTRAVENOUS | Status: DC
  Filled 2021-02-17: qty 20

## 2021-02-17 MED ORDER — FENTANYL CITRATE (PF) 100 MCG/2ML IJ SOLN
INTRAMUSCULAR | Status: AC
  Filled 2021-02-17: qty 2

## 2021-02-17 MED ORDER — FENTANYL CITRATE (PF) 100 MCG/2ML IJ SOLN
25.0000 ug | INTRAMUSCULAR | Status: DC | PRN
  Administered 2021-02-17: 50 ug via INTRAVENOUS

## 2021-02-17 MED ORDER — ACETAMINOPHEN 10 MG/ML IV SOLN
INTRAVENOUS | Status: AC
  Administered 2021-02-17: 1000 mg via INTRAVENOUS
  Filled 2021-02-17: qty 100

## 2021-02-17 MED ORDER — OXYCODONE HCL 5 MG/5ML PO SOLN: 5.0000 mg | Freq: Once | ORAL | Status: AC | PRN

## 2021-02-17 MED ORDER — CEFAZOLIN SODIUM-DEXTROSE 1-4 GM/50ML-% IV SOLN: 1.0000 g | Freq: Once | INTRAVENOUS | Status: DC

## 2021-02-17 MED ORDER — MIDAZOLAM HCL 2 MG/2ML IJ SOLN
INTRAMUSCULAR | Status: AC
  Filled 2021-02-17: qty 2

## 2021-02-17 SURGICAL SUPPLY — 26 items
BAG SPEC THK2 15X12 ZIP CLS (MISCELLANEOUS) ×1
BAG ZIPLOCK 12X15 (MISCELLANEOUS) ×3 IMPLANT
COVER WAND RF STERILE (DRAPES) IMPLANT
DRAIN PENROSE 0.5X18 (DRAIN) ×2 IMPLANT
DRSG EMULSION OIL 3X3 NADH (GAUZE/BANDAGES/DRESSINGS) ×2 IMPLANT
DRSG PAD ABDOMINAL 8X10 ST (GAUZE/BANDAGES/DRESSINGS) ×2 IMPLANT
DURAPREP 26ML APPLICATOR (WOUND CARE) ×3 IMPLANT
ELECT REM PT RETURN 15FT ADLT (MISCELLANEOUS) ×3 IMPLANT
GAUZE SPONGE 4X4 12PLY STRL (GAUZE/BANDAGES/DRESSINGS) ×2 IMPLANT
GLOVE SURG ENC MOIS LTX SZ8 (GLOVE) ×3 IMPLANT
GLOVE SURG LTX SZ7.5 (GLOVE) ×3 IMPLANT
HANDPIECE INTERPULSE COAX TIP (DISPOSABLE) ×3
KIT BASIN OR (CUSTOM PROCEDURE TRAY) ×3 IMPLANT
KIT TURNOVER KIT A (KITS) ×3 IMPLANT
MANIFOLD NEPTUNE II (INSTRUMENTS) ×3 IMPLANT
PACK TOTAL JOINT (CUSTOM PROCEDURE TRAY) ×3 IMPLANT
PENCIL SMOKE EVACUATOR (MISCELLANEOUS) IMPLANT
PROTECTOR NERVE ULNAR (MISCELLANEOUS) ×3 IMPLANT
SET HNDPC FAN SPRY TIP SCT (DISPOSABLE) ×1 IMPLANT
STAPLER VISISTAT 35W (STAPLE) ×3 IMPLANT
SUT VIC AB 1 CT1 27 (SUTURE) ×9
SUT VIC AB 1 CT1 27XBRD ANTBC (SUTURE) ×3 IMPLANT
SUT VIC AB 2-0 CT1 27 (SUTURE) ×9
SUT VIC AB 2-0 CT1 TAPERPNT 27 (SUTURE) ×3 IMPLANT
TAPE PAPER 3X10 WHT MICROPORE (GAUZE/BANDAGES/DRESSINGS) ×2 IMPLANT
TOWEL OR 17X26 10 PK STRL BLUE (TOWEL DISPOSABLE) ×3 IMPLANT

## 2021-02-17 NOTE — H&P (Signed)
A pre op hand p   Chief Complaint: Severe left groin pain with inability to stand and walk  HPI: Don Ruiz is a 70 y.o. male who presents for evaluation of severe left groin pain with inability to stand and walk. It has been present for approximately 5 days and has been worsening. He has failed conservative measures. Pain is rated as severe.  Past Medical History:  Diagnosis Date   Ankle impingement syndrome    Right ankle from bone spur   Antiphospholipid antibody syndrome ([place]) 06/12/2012   Coagulopathy ([place]) 06/12/2012   DVT (deep venous thrombosis) ([place]) 1990, 2002   chronic hypercoagulobility.    DVT, recurrent, lower extremity, chronic ([place]) 06/12/2012   1992   Dysrhythmia    AF   GERD (gastroesophageal reflux disease)    H/O tinnitus    Heterozygous factor V Leiden mutation ([place]) 06/12/2012   Hyperlipidemia    Migraine headache    Protein S deficiency ([place]) 06/12/2012   Prothrombin gene mutation ([place]) 06/12/2012   Pulmonary embolism ([place])    Pulmonary embolism, bilateral ([place]) 06/12/2012   July, 2002   PVC (premature ventricular contraction)    Varicose veins 06/12/2012   RLE   Wears hearing aid    Past Surgical History:  Procedure Laterality Date   ABDOMINAL AORTIC ANEURYSM REPAIR  12/14   [facility]   ANKLE SURGERY  2005   right   APPENDECTOMY     ARTERY BIOPSY Left 08/12/2014   Procedure: LEFT TEMPORAL ARTERY BIOPSY ;  Surgeon: Coralie Keens, MD;  Location: [facility];  Service: General;  Laterality: Left;   CHOLECYSTECTOMY  2008   lap choli   COLONOSCOPY N/A 09/03/2013   Procedure: COLONOSCOPY;  Surgeon: Garlan Fair, MD;  Location: [facility];  Service: Endoscopy;  Laterality: N/A;   FINGER SURGERY     Right 1st finger,  by Dr. Kalman Shan TENDON REPAIR  2013   left 5th finger by Dr. Caralyn Guile   PATELLA RELEASE AND MANIPULATION  1992   Right Patella   Social History   Socioeconomic History   Marital status: Married    Spouse name: Not  on file   Number of children: Not on file   Years of education: Not on file   Highest education level: Not on file  Occupational History   Not on file  Tobacco Use   Smoking status: Never   Smokeless tobacco: Never  Substance and Sexual Activity   Alcohol use: No   Drug use: No   Sexual activity: Not on file  Other Topics Concern   Not on file  Social History Narrative   Not on file   Social Determinants of Health   Financial Resource Strain: Not on file  Food Insecurity: Not on file  Transportation Needs: Not on file  Physical Activity: Not on file  Stress: Not on file  Social Connections: Not on file   Family History  Problem Relation Age of Onset   Cancer Mother        Breast cancer   Hypertension Mother    Diabetes Mother    Fibromyalgia Mother    Stroke Mother    Stroke Father    Heart disease Father    Alcohol abuse Sister    Hyperlipidemia Sister    Hypertension Sister    Heart disease Brother        CABG at 88   Stroke Brother    Pulmonary embolism Brother  Heart disease Other    Hyperlipidemia Other    Heart disease Brother        MI at 43 CABG   Heart disease Brother        CABG,   MI at 41   Stroke Sister 53   Heart disease Sister    Cancer Sister        Breast Cancer   Other Sister        acoustic tumor   Deep vein thrombosis Sister    Allergies  Allergen Reactions   Niacin And Related     Hepatitis   Phisohex [Hexachlorophene]     rash   Topamax [Topiramate]     suicidal   Prior to Admission medications   Medication Sig Start Date End Date Taking? Authorizing Provider  celecoxib (CELEBREX) 200 MG capsule Take 200 mg by mouth daily as needed. For pain    [provider]  diphenhydrAMINE (BENADRYL) 25 MG tablet Take 25 mg by mouth as needed for sleep.    [provider]  diphenhydramine-acetaminophen (TYLENOL PM EXTRA STRENGTH) 25-500 MG TABS tablet Take 2 tablets by mouth at bedtime as needed (for sleep).     [provider]  ezetimibe (ZETIA) 10 MG tablet Take 1 tablet by mouth daily. 01/27/17   [provider]  gabapentin (NEURONTIN) 600 MG tablet Take 300-600 mg by mouth See admin instructions. 600 in the morning and 300 at night    [provider]  HYDROcodone-acetaminophen (NORCO) 5-325 MG per tablet Take 1-2 tablets by mouth every 4 (four) hours as needed. Patient not taking: Reported on 02/21/2018 08/12/14   Coralie Keens, MD  Multiple Vitamin (MULITIVITAMIN WITH MINERALS) TABS Take 1 tablet by mouth daily.    [provider]  pravastatin (PRAVACHOL) 80 MG tablet Take 80 mg by mouth daily.    [provider]  psyllium (METAMUCIL) 58.6 % packet Take 1 packet by mouth daily.    [provider]  warfarin (JANTOVEN) 5 MG tablet Take 5 mg by mouth daily. 5 mg every day.    [provider]     Positive ROS: None  All other systems have been reviewed and were otherwise negative with the exception of those mentioned in the HPI and as above.  Physical Exam: There were no vitals filed for this visit.  General: Alert, no acute distress Cardiovascular: No pedal edema Respiratory: No cyanosis, no use of accessory musculature GI: No organomegaly, abdomen is soft and non-tender Skin: No lesions in the area of chief complaint Neurologic: Sensation intact distally Psychiatric: Patient is competent for consent with normal mood and affect Lymphatic: No axillary or cervical lymphadenopathy  MUSCULOSKELETAL: Left thigh.  Tender to palpation.  Painful range of motion.  Significant ecchymosis.  Negative heel strike.  CT scan: CT shows significant hematoma approximately 12 x 7 x 8 cm.  Assessment/Plan: LEFT THIGH HEMATOMA Plan for Procedure(s): IRRIGATION AND DEBRIDEMENT LEFT THIGH  The risks benefits and alternatives were discussed with the patient including but not limited to the risks of nonoperative treatment, versus surgical  intervention including infection, bleeding, nerve injury, malunion, nonunion, hardware prominence, hardware failure, need for hardware removal, blood clots, cardiopulmonary complications, morbidity, mortality, among others, and they were willing to proceed.  Predicted outcome is good, although there will be at least a six to nine month expected recovery.  Alta Corning, MD 02/17/2021 1:17 PM

## 2021-02-17 NOTE — Anesthesia Procedure Notes (Addendum)
Procedure Name: LMA Insertion Date/Time: 02/17/2021 3:40 PM Performed by: Lissa Morales, CRNA Pre-anesthesia Checklist: Patient identified, Emergency Drugs available, Suction available and Patient being monitored Patient Re-evaluated:Patient Re-evaluated prior to induction Oxygen Delivery Method: Circle system utilized Preoxygenation: Pre-oxygenation with 100% oxygen Induction Type: IV induction LMA: LMA with gastric port inserted LMA Size: 5.0 Tube type: Oral Number of attempts: 1 Placement Confirmation: positive ETCO2 Tube secured with: Tape Dental Injury: Teeth and Oropharynx as per pre-operative assessment

## 2021-02-17 NOTE — Discharge Instructions (Signed)
Apply ice to your medial left thigh. Weight-bear as tolerated.  Use crutches or a walker if needed. Use pain medication as needed.  Follow-up in the office tomorrow at 4 PM.  You do not need to call to make an appointment.

## 2021-02-17 NOTE — Anesthesia Preprocedure Evaluation (Signed)
Anesthesia Evaluation  Patient identified by MRN, date of birth, ID band Patient awake    Reviewed: Allergy & Precautions, NPO status , Patient's Chart, lab work & pertinent test results  Airway Mallampati: I       Dental no notable dental hx.    Pulmonary neg pulmonary ROS,    Pulmonary exam normal        Cardiovascular Normal cardiovascular exam     Neuro/Psych  Headaches, negative psych ROS   GI/Hepatic   Endo/Other    Renal/GU   negative genitourinary   Musculoskeletal   Abdominal Normal abdominal exam  (+)   Peds  Hematology  (+) Blood dyscrasia, ,   Anesthesia Other Findings   Reproductive/Obstetrics                             Anesthesia Physical Anesthesia Plan  ASA: 2  Anesthesia Plan: General   Post-op Pain Management:    Induction: Intravenous  PONV Risk Score and Plan: 2 and Ondansetron and Dexamethasone  Airway Management Planned: LMA  Additional Equipment: None  Intra-op Plan:   Post-operative Plan: Extubation in OR  Informed Consent: I have reviewed the patients History and Physical, chart, labs and discussed the procedure including the risks, benefits and alternatives for the proposed anesthesia with the patient or authorized representative who has indicated his/her understanding and acceptance.     Dental advisory given  Plan Discussed with: CRNA  Anesthesia Plan Comments:         Anesthesia Quick Evaluation

## 2021-02-17 NOTE — Brief Op Note (Signed)
02/17/2021  4:31 PM  PATIENT:  Don Ruiz  70 y.o. male  PRE-OPERATIVE DIAGNOSIS:  LEFT THIGH HEMATOMA  POST-OPERATIVE DIAGNOSIS:  LEFT THIGH HEMATOMA  PROCEDURE:  Procedure(s): IRRIGATION AND DEBRIDEMENT LEFT THIGH (Left)  SURGEON:  Surgeon(s) and Role:    Dorna Leitz, MD - Primary  PHYSICIAN ASSISTANT:   ASSISTANTS: jim bethune   ANESTHESIA:   general  EBL:  150 mL   BLOOD ADMINISTERED:none  DRAINS: Penrose drain in the l groin    LOCAL MEDICATIONS USED:  NONE  SPECIMEN:  No Specimen  DISPOSITION OF SPECIMEN:  N/A  COUNTS:  YES  TOURNIQUET:  * No tourniquets in log *  DICTATION: .Other Dictation: Dictation Number [id number]  PLAN OF CARE: Discharge to home after PACU  PATIENT DISPOSITION:  PACU - hemodynamically stable.   Delay start of Pharmacological VTE agent (>24hrs) due to surgical blood loss or risk of bleeding: no

## 2021-02-17 NOTE — Transfer of Care (Signed)
Immediate Anesthesia Transfer of Care Note  Patient: Don Ruiz  Procedure(s) Performed: IRRIGATION AND DEBRIDEMENT LEFT THIGH (Left: Thigh)  Patient Location: PACU  Anesthesia Type:General  Level of Consciousness: awake and patient cooperative  Airway & Oxygen Therapy: Patient Spontanous Breathing and Patient connected to face mask oxygen  Post-op Assessment: Report given to RN, Post -op Vital signs reviewed and stable and Patient moving all extremities X 4  Post vital signs: stable  Last Vitals:  Vitals Value Taken Time  BP 154/87 02/17/21 1632  Temp 36.6 C 02/17/21 1632  Pulse 79 02/17/21 1637  Resp 17 02/17/21 1637  SpO2 100 % 02/17/21 1637  Vitals shown include unvalidated device data.  Last Pain:  Vitals:   02/17/21 1331  TempSrc: Oral  PainSc: 4       Patients Stated Pain Goal: 3 (00/93/81 8299)  Complications: No notable events documented.

## 2021-02-18 ENCOUNTER — Encounter (HOSPITAL_COMMUNITY): Payer: Self-pay | Admitting: Orthopedic Surgery

## 2021-02-18 NOTE — Op Note (Signed)
NAME: Don Ruiz, Don Ruiz MEDICAL RECORD NO: [id number] ACCOUNT NO: [id number] DATE OF BIRTH: 1951-08-16 FACILITY: [facility] LOCATION: [facility] PHYSICIAN: Alta Corning, MD  Operative Report   DATE OF PROCEDURE: 02/17/2021  He is a 70 year old male in the orthopedic surgery service.  PREOPERATIVE DIAGNOSIS:  Contained deep hematoma in the vastus medialis, left leg.  POSTOPERATIVE DIAGNOSIS:  Contained deep hematoma in the vastus medialis, left leg.  PROCEDURE:  Irrigation and debridement and evacuation of deep hematoma, left thigh.  SURGEON:  Dorna Leitz, MD  ASSISTANT:  Modena Slater.  ANESTHESIA:  General.  BRIEF HISTORY:  The patient is a 70 year old male, on chronic Coumadin because of a coagulopathy, who had a high INR and  suffered an adductor injury and began having worsening pain, over a couple of days, the pain got to the point of severe.  Because of  significant complaints of pain, a CT scan showing a large hematoma in a patient who was really miserable, he was taken to the operating room for evacuation of hematoma.  DESCRIPTION OF PROCEDURE:  The patient was taken to the operating room, after adequate anesthesia was obtained with a general anesthetic, the patient was placed supine on the operating table.  The left leg was then prepped and draped in the usual sterile  fashion.  Following this, based on ultrasound guidance and marking, he was taken to the operating room where an incision was made about 6 cm distal to the pubic tubercle.  We used the finger for finger dissection and sucker for suction and we were able  to get into the hematoma and removed probably 40 mL of crankcase type blood.  There was really no significant clotting.  This was removed.  We then kind of went around this area to make sure there was not any additional area of problem.  At that point,  irrigated the wound.  We did send cultures just to make sure there was no culture issue.  At this point, we irrigated the wound  thoroughly, put a drain in into this area of the hematoma and then stapled the skin shut and then put a sterile compressive  dressing across this area.  Once this was done, he was taken to the recovery room, was noted to be in a satisfactory condition.  Estimated blood loss for procedure was probably 100 mL, but of that probably 50 was the retained hematoma.   SHW D: 02/17/2021 4:35:11 pm T: 02/18/2021 12:08:00 am  JOB: [id number]/ [id number]

## 2021-02-19 NOTE — Anesthesia Postprocedure Evaluation (Signed)
Anesthesia Post Note  Patient: Don Ruiz  Procedure(s) Performed: IRRIGATION AND DEBRIDEMENT LEFT THIGH (Left: Thigh)     Patient location during evaluation: PACU Anesthesia Type: General Level of consciousness: awake Pain management: pain level controlled Vital Signs Assessment: post-procedure vital signs reviewed and stable Respiratory status: spontaneous breathing Cardiovascular status: stable Postop Assessment: no apparent nausea or vomiting Anesthetic complications: no   No notable events documented.  Last Vitals:  Vitals:   02/17/21 1715 02/17/21 1730  BP: (!) 153/91 (!) 155/82  Pulse: 76 78  Resp: 20 18  Temp: 36.6 C   SpO2: 96% 96%    Last Pain:  Vitals:   02/17/21 1730  TempSrc:   PainSc: 0-No pain                 Huston Foley

## 2021-02-22 LAB — AEROBIC/ANAEROBIC CULTURE W GRAM STAIN (SURGICAL/DEEP WOUND)
Culture: NO GROWTH
Gram Stain: NONE SEEN

## 2021-05-12 ENCOUNTER — Other Ambulatory Visit: Payer: Self-pay | Admitting: Internal Medicine

## 2021-05-12 DIAGNOSIS — N289 Disorder of kidney and ureter, unspecified: Secondary | ICD-10-CM

## 2021-05-26 ENCOUNTER — Ambulatory Visit
Admission: RE | Admit: 2021-05-26 | Discharge: 2021-05-26 | Disposition: A | Discharge status: Home / Self Care | Payer: Medicare Other | Source: Ambulatory Visit | Attending: Internal Medicine | Admitting: Internal Medicine

## 2021-05-26 DIAGNOSIS — N289 Disorder of kidney and ureter, unspecified: Secondary | ICD-10-CM

## 2021-08-27 ENCOUNTER — Other Ambulatory Visit: Payer: Self-pay

## 2021-08-27 ENCOUNTER — Inpatient Hospital Stay (HOSPITAL_COMMUNITY)
Admission: EM | Admit: 2021-08-27 | Discharge: 2021-09-02 | DRG: 155 | Disposition: A | Discharge status: Home / Self Care | Payer: Medicare Other | Attending: Internal Medicine | Admitting: Internal Medicine

## 2021-08-27 ENCOUNTER — Emergency Department (HOSPITAL_COMMUNITY): Payer: Medicare Other

## 2021-08-27 DIAGNOSIS — Z86718 Personal history of other venous thrombosis and embolism: Secondary | ICD-10-CM

## 2021-08-27 DIAGNOSIS — Z974 Presence of external hearing-aid: Secondary | ICD-10-CM

## 2021-08-27 DIAGNOSIS — Z8249 Family history of ischemic heart disease and other diseases of the circulatory system: Secondary | ICD-10-CM

## 2021-08-27 DIAGNOSIS — E785 Hyperlipidemia, unspecified: Secondary | ICD-10-CM | POA: Diagnosis present

## 2021-08-27 DIAGNOSIS — D6851 Activated protein C resistance: Secondary | ICD-10-CM | POA: Diagnosis present

## 2021-08-27 DIAGNOSIS — I10 Essential (primary) hypertension: Secondary | ICD-10-CM | POA: Diagnosis present

## 2021-08-27 DIAGNOSIS — Z9049 Acquired absence of other specified parts of digestive tract: Secondary | ICD-10-CM

## 2021-08-27 DIAGNOSIS — I480 Paroxysmal atrial fibrillation: Secondary | ICD-10-CM | POA: Diagnosis present

## 2021-08-27 DIAGNOSIS — Z20822 Contact with and (suspected) exposure to covid-19: Secondary | ICD-10-CM | POA: Diagnosis present

## 2021-08-27 DIAGNOSIS — H9122 Sudden idiopathic hearing loss, left ear: Secondary | ICD-10-CM | POA: Diagnosis not present

## 2021-08-27 DIAGNOSIS — D689 Coagulation defect, unspecified: Secondary | ICD-10-CM | POA: Diagnosis present

## 2021-08-27 DIAGNOSIS — Z83438 Family history of other disorder of lipoprotein metabolism and other lipidemia: Secondary | ICD-10-CM

## 2021-08-27 DIAGNOSIS — R42 Dizziness and giddiness: Secondary | ICD-10-CM | POA: Diagnosis present

## 2021-08-27 DIAGNOSIS — H903 Sensorineural hearing loss, bilateral: Principal | ICD-10-CM | POA: Diagnosis present

## 2021-08-27 DIAGNOSIS — I714 Abdominal aortic aneurysm, without rupture, unspecified: Secondary | ICD-10-CM | POA: Diagnosis present

## 2021-08-27 DIAGNOSIS — D6852 Prothrombin gene mutation: Secondary | ICD-10-CM | POA: Diagnosis present

## 2021-08-27 DIAGNOSIS — K219 Gastro-esophageal reflux disease without esophagitis: Secondary | ICD-10-CM | POA: Diagnosis present

## 2021-08-27 DIAGNOSIS — Z888 Allergy status to other drugs, medicaments and biological substances status: Secondary | ICD-10-CM

## 2021-08-27 DIAGNOSIS — Z7901 Long term (current) use of anticoagulants: Secondary | ICD-10-CM

## 2021-08-27 DIAGNOSIS — Z86711 Personal history of pulmonary embolism: Secondary | ICD-10-CM

## 2021-08-27 DIAGNOSIS — Z79899 Other long term (current) drug therapy: Secondary | ICD-10-CM

## 2021-08-27 LAB — CBC WITH DIFFERENTIAL/PLATELET
Abs Immature Granulocytes: 0.03 10*3/uL (ref 0.00–0.07)
Basophils Absolute: 0 10*3/uL (ref 0.0–0.1)
Basophils Relative: 0 %
Eosinophils Absolute: 0 10*3/uL (ref 0.0–0.5)
Eosinophils Relative: 0 %
HCT: 43.5 % (ref 39.0–52.0)
Hemoglobin: 14.3 g/dL (ref 13.0–17.0)
Immature Granulocytes: 0 %
Lymphocytes Relative: 9 %
Lymphs Abs: 0.9 10*3/uL (ref 0.7–4.0)
MCH: 28.8 pg (ref 26.0–34.0)
MCHC: 32.9 g/dL (ref 30.0–36.0)
MCV: 87.7 fL (ref 80.0–100.0)
Monocytes Absolute: 0.2 10*3/uL (ref 0.1–1.0)
Monocytes Relative: 2 %
Neutro Abs: 8.5 10*3/uL — ABNORMAL HIGH (ref 1.7–7.7)
Neutrophils Relative %: 89 %
Platelets: 209 10*3/uL (ref 150–400)
RBC: 4.96 MIL/uL (ref 4.22–5.81)
RDW: 13.9 % (ref 11.5–15.5)
WBC: 9.6 10*3/uL (ref 4.0–10.5)
nRBC: 0 % (ref 0.0–0.2)

## 2021-08-27 LAB — BASIC METABOLIC PANEL
Anion gap: 10 (ref 5–15)
BUN: 23 mg/dL (ref 8–23)
CO2: 21 mmol/L — ABNORMAL LOW (ref 22–32)
Calcium: 8.7 mg/dL — ABNORMAL LOW (ref 8.9–10.3)
Chloride: 105 mmol/L (ref 98–111)
Creatinine, Ser: 1.19 mg/dL (ref 0.61–1.24)
GFR, Estimated: >60 mL/min (ref >60)
Glucose, Bld: 171 mg/dL — ABNORMAL HIGH (ref 70–99)
Potassium: 3.8 mmol/L (ref 3.5–5.1)
Sodium: 136 mmol/L (ref 135–145)

## 2021-08-27 LAB — PROTIME-INR
INR: 2.3 — ABNORMAL HIGH (ref 0.8–1.2)
Prothrombin Time: 25.4 seconds — ABNORMAL HIGH (ref 11.4–15.2)

## 2021-08-27 LAB — APTT: aPTT: 36 seconds (ref 24–36)

## 2021-08-27 MED ORDER — MORPHINE SULFATE (PF) 2 MG/ML IV SOLN
2.0000 mg | Freq: Once | INTRAVENOUS | Status: AC
  Administered 2021-08-27: 20:00:00 2 mg via INTRAVENOUS
  Filled 2021-08-27: qty 1

## 2021-08-27 MED ORDER — PROCHLORPERAZINE EDISYLATE 10 MG/2ML IJ SOLN
10.0000 mg | Freq: Once | INTRAMUSCULAR | Status: AC
  Administered 2021-08-27: 22:00:00 10 mg via INTRAVENOUS
  Filled 2021-08-27: qty 2

## 2021-08-27 MED ORDER — MECLIZINE HCL 25 MG PO TABS
25.0000 mg | ORAL_TABLET | Freq: Once | ORAL | Status: AC
  Administered 2021-08-27: 22:00:00 25 mg via ORAL
  Filled 2021-08-27: qty 1

## 2021-08-27 MED ORDER — ONDANSETRON HCL 4 MG/2ML IJ SOLN
4.0000 mg | Freq: Once | INTRAMUSCULAR | Status: AC
  Administered 2021-08-27: 20:00:00 4 mg via INTRAVENOUS
  Filled 2021-08-27: qty 2

## 2021-08-27 NOTE — ED Provider Notes (Signed)
[facility] EMERGENCY DEPARTMENT Provider Note   CSN: [id number] Arrival date & time: 08/27/21  1851     History Chief Complaint  Patient presents with   Dizziness   loss of hearing    Don Ruiz is a 70 y.o. male with past medical history significant for factor V Leiden, protein S deficiency, right sensorineural hearing loss who presents with vertigo and left-sided hearing loss.  The patient woke up this morning and was able to hear out of his left ear.  They called his ENT who instructed him to have audiometric testing done at [facility].  He was found to have severe sensorineural hearing loss in the left ear and was referred to the outpatient clinic for intratympanic dexamethasone injections.  Following the injection, the patient had acute onset of severe vertigo.  He was told at the ENT clinic that these side effects should only last for an hour or less.  When the patient's wife got him home, he was so dizzy that he was unable to get out of the car.  She called the on-call ENT physician at [facility] to send in a prescription for meclizine.  The patient's wife gave him meclizine and he was still too dizzy and throwing up severely dizzy and throwing up.  She then decided to bring him to [facility] health ED for further evaluation.     Past Medical History:  Diagnosis Date   Ankle impingement syndrome    Right ankle from bone spur   Antiphospholipid antibody syndrome ([place]) 06/12/2012   Coagulopathy ([place]) 06/12/2012   DVT (deep venous thrombosis) ([place]) 1990, 2002   chronic hypercoagulobility.    DVT, recurrent, lower extremity, chronic 06/12/2012   1992   Dysrhythmia    AF   GERD (gastroesophageal reflux disease)    H/O tinnitus    Heterozygous factor V Leiden mutation ([place]) 06/12/2012   Hyperlipidemia    Migraine headache    Protein S deficiency ([place]) 06/12/2012   Prothrombin gene mutation ([place]) 06/12/2012   Pulmonary embolism ([place])    Pulmonary embolism,  bilateral ([place]) 06/12/2012   July, 2002   PVC (premature ventricular contraction)    Varicose veins 06/12/2012   RLE   Wears hearing aid     Patient Active Problem List   Diagnosis Date Noted   Hematoma of left thigh 02/17/2021   Elevated LFTs    Acute epigastric pain 02/04/2017   HTN (hypertension) 09/27/2013   SNHL (sensorineural hearing loss) 05/24/2013   CAD (coronary artery disease) 04/18/2013   Chronic anticoagulation 04/18/2013   GERD (gastroesophageal reflux disease) 04/18/2013   History of pulmonary embolism 04/18/2013   Blood clotting disorder ([place]) 06/12/2012   Heterozygous factor V Leiden mutation ([place]) 06/12/2012   Prothrombin gene mutation ([place]) 06/12/2012   Antiphospholipid antibody syndrome ([place]) 06/12/2012   Protein S deficiency ([place]) 06/12/2012   DVT, recurrent, lower extremity, chronic 06/12/2012   Pulmonary embolism, bilateral ([place]) 06/12/2012   Varicose veins 06/12/2012   Chronic venous embolism and thrombosis of deep vessels of lower extremity ([place]) 06/12/2012   Primary hypercoagulable state ([place]) 06/12/2012   Abdominal aneurysm without mention of rupture 03/12/2012   Dissection of aorta, abdominal ([place]) 03/12/2012   ABNORMALITY OF GAIT 07/02/2009   SPRAIN&STRAIN OTHER SPECIFIED SITES KNEE&LEG 07/02/2009    Past Surgical History:  Procedure Laterality Date   ABDOMINAL AORTIC ANEURYSM REPAIR  12/14   [facility]   ANKLE SURGERY  2005   right   APPENDECTOMY  ARTERY BIOPSY Left 08/12/2014   Procedure: LEFT TEMPORAL ARTERY BIOPSY ;  Surgeon: Don Keens, MD;  Location: [facility];  Service: General;  Laterality: Left;   CHOLECYSTECTOMY  2008   lap [facility]   COLONOSCOPY N/A 09/03/2013   Procedure: COLONOSCOPY;  Surgeon: Garlan Fair, MD;  Location: [facility];  Service: Endoscopy;  Laterality: N/A;   FINGER SURGERY     Right 1st finger,  by Dr. Kalman Shan TENDON REPAIR  2013   left 5th finger by Dr. Caralyn Guile   INCISION AND  DRAINAGE HIP Left 02/17/2021   Procedure: IRRIGATION AND DEBRIDEMENT LEFT THIGH;  Surgeon: Dorna Leitz, MD;  Location: [facility];  Service: Orthopedics;  Laterality: Left;   PATELLA RELEASE AND MANIPULATION  1992   Right [place]       Family History  Problem Relation Age of Onset   Cancer Mother        Breast cancer   Hypertension Mother    Diabetes Mother    Fibromyalgia Mother    Stroke Mother    Stroke Father    Heart disease Father    Alcohol abuse Sister    Hyperlipidemia Sister    Hypertension Sister    Heart disease Brother        CABG at 45   Stroke Brother    Pulmonary embolism Brother    Heart disease Other    Hyperlipidemia Other    Heart disease Brother        MI at 13 CABG   Heart disease Brother        CABG,   MI at 42   Stroke Sister 74   Heart disease Sister    Cancer Sister        Breast Cancer   Other Sister        acoustic tumor   Deep vein thrombosis Sister     Social History   Tobacco Use   Smoking status: Never   Smokeless tobacco: Never  Substance Use Topics   Alcohol use: No   Drug use: No    Home Medications Prior to Admission medications   Medication Sig Start Date End Date Taking? Authorizing Provider  celecoxib (CELEBREX) 200 MG capsule Take 200 mg by mouth daily as needed. For pain   Yes [provider]  diphenhydrAMINE (BENADRYL) 25 MG tablet Take 25 mg by mouth as needed for sleep.   Yes [provider]  diphenhydramine-acetaminophen (TYLENOL PM) 25-500 MG TABS tablet Take 2 tablets by mouth at bedtime as needed (for sleep).   Yes [provider]  ezetimibe (ZETIA) 10 MG tablet Take 1 tablet by mouth daily. 01/27/17  Yes [provider]  gabapentin (NEURONTIN) 600 MG tablet Take 300-600 mg by mouth See admin instructions. 600 in the morning and 300 at night   Yes [provider]  Multiple Vitamin (MULITIVITAMIN WITH MINERALS) TABS Take 1 tablet by mouth daily.   Yes [provider]  nitroGLYCERIN (NITROSTAT) 0.3 MG SL tablet Place 0.3 mg under the tongue every 5 (five) minutes as needed for chest pain.   Yes [provider]  pravastatin (PRAVACHOL) 80 MG tablet Take 80 mg by mouth daily.   Yes [provider]  psyllium (METAMUCIL) 58.6 % packet Take 1 packet by mouth daily.   Yes [provider]  ramipril (ALTACE) 10 MG capsule Take 10 mg by mouth daily. 06/12/21  Yes [provider]  warfarin (COUMADIN) 5 MG  tablet Take 5 mg by mouth daily. 5 mg every day.   Yes [provider]  oxyCODONE-acetaminophen (PERCOCET/ROXICET) 5-325 MG tablet Take 1-2 tablets by mouth every 6 (six) hours as needed for severe pain. Patient not taking: Reported on 08/27/2021 02/17/21   Don Fleet, PA-C    Allergies    Niacin and related, Phisohex [hexachlorophene], and Topamax [topiramate]  Review of Systems   Review of Systems  Constitutional:  Negative for chills and fever.  HENT:  Positive for hearing loss. Negative for congestion.   Eyes:  Positive for photophobia and visual disturbance.  Respiratory:  Negative for cough and shortness of breath.   Cardiovascular:  Negative for chest pain and leg swelling.  Gastrointestinal:  Positive for nausea and vomiting. Negative for abdominal pain, constipation and diarrhea.  Genitourinary:  Negative for decreased urine volume and dysuria.  Musculoskeletal:  Negative for arthralgias and myalgias.  Skin:  Negative for rash and wound.  Neurological:  Positive for dizziness. Negative for facial asymmetry, speech difficulty, weakness, numbness and headaches.  All other systems reviewed and are negative.  Physical Exam Updated Vital Signs BP 120/63    Pulse 62    Resp 18    SpO2 94%   Physical Exam Vitals and nursing note reviewed.  Constitutional:      General: He is not in acute distress.    Appearance: He is well-developed.  HENT:     Head: Normocephalic and atraumatic.     Right Ear:  Tympanic membrane and external ear normal. Decreased hearing noted.     Left Ear: External ear normal. Decreased hearing noted. No drainage or tenderness. No hemotympanum. Tympanic membrane is not injected, perforated or bulging.     Ears:     Comments: Intratympanic injection site visualized on the inferior aspect of the left TM. No hemotympanum or TM rupture.    Nose: Nose normal.     Mouth/Throat:     Mouth: Mucous membranes are moist.     Pharynx: Oropharynx is clear.  Eyes:     Extraocular Movements: Extraocular movements intact.     Conjunctiva/sclera: Conjunctivae normal.     Pupils: Pupils are equal, round, and reactive to light.  Cardiovascular:     Rate and Rhythm: Normal rate and regular rhythm.     Heart sounds: No murmur heard. Pulmonary:     Effort: Pulmonary effort is normal. No respiratory distress.     Breath sounds: Normal breath sounds.  Abdominal:     General: There is no distension.     Palpations: Abdomen is soft.     Tenderness: There is no abdominal tenderness. There is no guarding or rebound.  Musculoskeletal:        General: No swelling.     Cervical back: Neck supple.  Skin:    General: Skin is warm and dry.     Capillary Refill: Capillary refill takes less than 2 seconds.  Neurological:     General: No focal deficit present.     Mental Status: He is alert and oriented to person, place, and time.     GCS: GCS eye subscore is 4. GCS verbal subscore is 5. GCS motor subscore is 6.     Cranial Nerves: Cranial nerves 2-12 are intact.     Sensory: Sensation is intact.     Motor: Motor function is intact.  Psychiatric:        Mood and Affect: Mood normal.    ED Results / Procedures / Treatments  Labs (all labs ordered are listed, but only abnormal results are displayed) Labs Reviewed  CBC WITH DIFFERENTIAL/PLATELET - Abnormal; Notable for the following components:      Result Value   Neutro Abs 8.5 (*)    All other components within normal limits   BASIC METABOLIC PANEL - Abnormal; Notable for the following components:   CO2 21 (*)    Glucose, Bld 171 (*)    Calcium 8.7 (*)    All other components within normal limits  PROTIME-INR - Abnormal; Notable for the following components:   Prothrombin Time 25.4 (*)    INR 2.3 (*)    All other components within normal limits  APTT    EKG None  Radiology CT Head Wo Contrast  Result Date: 08/27/2021 CLINICAL DATA:  Acute neuro deficit.  Sudden hearing loss left ear. EXAM: CT HEAD WITHOUT CONTRAST TECHNIQUE: Contiguous axial images were obtained from the base of the skull through the vertex without intravenous contrast. COMPARISON:  CT head 03/03/2014 FINDINGS: Brain: No evidence of acute infarction, hemorrhage, hydrocephalus, extra-axial collection or mass lesion/mass effect. Vascular: Negative for hyperdense vessel. Skull: Negative Sinuses/Orbits: Paranasal sinuses clear. Bilateral cataract extraction Other: None IMPRESSION: Negative CT head Electronically Signed   By: Franchot Gallo M.D.   On: 08/27/2021 19:55    Procedures Procedures   Medications Ordered in ED Medications  ondansetron [facility]) injection 4 mg (4 mg Intravenous Given 08/27/21 2011)  morphine 2 MG/ML injection 2 mg (2 mg Intravenous Given 08/27/21 2012)  prochlorperazine (COMPAZINE) injection 10 mg (10 mg Intravenous Given 08/27/21 2159)  meclizine (ANTIVERT) tablet 25 mg (25 mg Oral Given 08/27/21 2158)    ED Course  I have reviewed the triage vital signs and the nursing notes.  Pertinent labs & imaging results that were available during my care of the patient were reviewed by me and considered in my medical decision making (see chart for details).    MDM Rules/Calculators/A&P                          Patient presents with vertiginous symptoms following ENT procedure as described in HPI above.  Physical exam with no focal neurologic deficits.  Left intratympanic injection site visualized on otoscope exam.  No  evidence of hemotympanum or TM perforation.  Meclizine and Compazine provided for patient's symptoms.  Will obtain MRI [facility] to rule out stroke or retrocochlear lesion.  MRI was pending at the time of handoff to Dr. Leonette Ruiz. Will plan for symptomatic control in ED. Patient reports minimal improvement after meclizine.  Final Clinical Impression(s) / ED Diagnoses Final diagnoses:  None    Rx / DC Orders ED Discharge Orders     None        Carnelius Hammitt, Don Hailey, MD 08/28/21 3235    Don Starch, MD 08/30/21 [phone]

## 2021-08-27 NOTE — ED Triage Notes (Signed)
Pt here from [facility] for sudden loss of hearing in L ear this morning. Pt had similar event 12 years ago in R ear, never regained hearing but has gotten steroid injections in ears. Pt c/o dizziness w/ standing.

## 2021-08-27 NOTE — ED Notes (Signed)
Patient transported to CT via stretcher.

## 2021-08-27 NOTE — ED Notes (Signed)
Pt back to room. Wife now at [facility]

## 2021-08-27 NOTE — ED Provider Notes (Signed)
Emergency Medicine Provider Triage Evaluation Note  Don Ruiz , a 70 y.o. male  was evaluated in triage.  Pt complains of sudden onset left sided hearing loss this morning. Went to PCP and got a steroid shot behind his ear. A short while after patient has severe positional dizziness, nausea, vomiting. Some pain. Denies headache, vision changes, weakness. Some dizziness / spinning even when not moving head.  Hx of sudden onset right sided hearing loss 12 years ago  Review of Systems  Positive: As above Negative: As above  Physical Exam  BP (!) 151/71 (BP Location: Right Arm)    Pulse 81    Resp 16    SpO2 93%  Gen:   Awake, distressed, slumped in chair Resp:  Normal effort  MSK:   Moves extremities without difficulty  Other:  No visible TM perforation on exam noted, no bleeding from ears Severe positional vertigo  Medical Decision Making  Medically screening exam initiated at 7:08 PM.  Appropriate orders placed.  Don Ruiz was informed that the remainder of the evaluation will be completed by another provider, this initial triage assessment does not replace that evaluation, and the importance of remaining in the ED until their evaluation is complete.  Spoke with Dr. Sabra Heck who requests patient is next into a room for further evaluation   Don Ruiz 08/27/21 1910    Don Chapel, MD 08/27/21 2302

## 2021-08-27 NOTE — ED Notes (Signed)
Patient transported to MRI via stretcher with wife in [facility]

## 2021-08-28 ENCOUNTER — Encounter (HOSPITAL_COMMUNITY): Payer: Self-pay | Admitting: Internal Medicine

## 2021-08-28 DIAGNOSIS — R42 Dizziness and giddiness: Secondary | ICD-10-CM

## 2021-08-28 DIAGNOSIS — I480 Paroxysmal atrial fibrillation: Secondary | ICD-10-CM | POA: Diagnosis present

## 2021-08-28 LAB — HIV ANTIBODY (ROUTINE TESTING W REFLEX): HIV Screen 4th Generation wRfx: NONREACTIVE

## 2021-08-28 LAB — RESP PANEL BY RT-PCR (FLU A&B, COVID) ARPGX2
Influenza A by PCR: NEGATIVE
Influenza B by PCR: NEGATIVE
SARS Coronavirus 2 by RT PCR: NEGATIVE

## 2021-08-28 MED ORDER — SCOPOLAMINE 1 MG/3DAYS TD PT72
1.0000 | MEDICATED_PATCH | TRANSDERMAL | Status: DC
  Administered 2021-08-28 – 2021-08-31 (×2): 1.5 mg via TRANSDERMAL
  Filled 2021-08-28 (×2): qty 1

## 2021-08-28 MED ORDER — CALCIUM CARBONATE ANTACID 500 MG PO CHEW
1.0000 | CHEWABLE_TABLET | Freq: Three times a day (TID) | ORAL | Status: DC
  Administered 2021-08-28 – 2021-08-31 (×3): 200 mg via ORAL
  Filled 2021-08-28 (×7): qty 1

## 2021-08-28 MED ORDER — GABAPENTIN 300 MG PO CAPS
300.0000 mg | ORAL_CAPSULE | Freq: Every day | ORAL | Status: DC
  Administered 2021-08-28 – 2021-09-01 (×5): 300 mg via ORAL
  Filled 2021-08-28 (×5): qty 1

## 2021-08-28 MED ORDER — SENNOSIDES-DOCUSATE SODIUM 8.6-50 MG PO TABS: 1.0000 | ORAL_TABLET | Freq: Every evening | ORAL | Status: DC | PRN

## 2021-08-28 MED ORDER — RAMIPRIL 5 MG PO CAPS
10.0000 mg | ORAL_CAPSULE | Freq: Every day | ORAL | Status: DC
  Administered 2021-08-29 – 2021-09-02 (×5): 10 mg via ORAL
  Filled 2021-08-28 (×6): qty 2

## 2021-08-28 MED ORDER — PREDNISONE 20 MG PO TABS
20.0000 mg | ORAL_TABLET | Freq: Three times a day (TID) | ORAL | Status: DC
  Administered 2021-08-28 – 2021-09-02 (×16): 20 mg via ORAL
  Filled 2021-08-28 (×16): qty 1

## 2021-08-28 MED ORDER — EZETIMIBE 10 MG PO TABS
10.0000 mg | ORAL_TABLET | Freq: Every day | ORAL | Status: DC
  Administered 2021-08-28 – 2021-09-02 (×6): 10 mg via ORAL
  Filled 2021-08-28 (×6): qty 1

## 2021-08-28 MED ORDER — IPRATROPIUM-ALBUTEROL 0.5-2.5 (3) MG/3ML IN SOLN: 3.0000 mL | RESPIRATORY_TRACT | Status: DC | PRN

## 2021-08-28 MED ORDER — METOPROLOL TARTRATE 5 MG/5ML IV SOLN: 5.0000 mg | INTRAVENOUS | Status: DC | PRN

## 2021-08-28 MED ORDER — NITROGLYCERIN 0.4 MG SL SUBL: 0.4000 mg | SUBLINGUAL_TABLET | SUBLINGUAL | Status: DC | PRN

## 2021-08-28 MED ORDER — DIAZEPAM 5 MG PO TABS
5.0000 mg | ORAL_TABLET | Freq: Three times a day (TID) | ORAL | Status: DC
  Administered 2021-08-28 – 2021-08-30 (×6): 5 mg via ORAL
  Filled 2021-08-28 (×6): qty 1

## 2021-08-28 MED ORDER — HYDRALAZINE HCL 20 MG/ML IJ SOLN: 10.0000 mg | INTRAMUSCULAR | Status: DC | PRN

## 2021-08-28 MED ORDER — PRAVASTATIN SODIUM 40 MG PO TABS
80.0000 mg | ORAL_TABLET | Freq: Every day | ORAL | Status: DC
  Administered 2021-08-28 – 2021-09-02 (×6): 80 mg via ORAL
  Filled 2021-08-28 (×6): qty 2

## 2021-08-28 MED ORDER — SODIUM CHLORIDE 0.9 % IV BOLUS
1000.0000 mL | Freq: Once | INTRAVENOUS | Status: AC
  Administered 2021-08-28: 04:00:00 1000 mL via INTRAVENOUS

## 2021-08-28 MED ORDER — MECLIZINE HCL 25 MG PO TABS
25.0000 mg | ORAL_TABLET | Freq: Three times a day (TID) | ORAL | Status: DC
  Administered 2021-08-28 – 2021-08-31 (×9): 25 mg via ORAL
  Filled 2021-08-28 (×19): qty 1

## 2021-08-28 MED ORDER — ACETAMINOPHEN 650 MG RE SUPP: 650.0000 mg | Freq: Four times a day (QID) | RECTAL | Status: DC | PRN

## 2021-08-28 MED ORDER — DIAZEPAM 5 MG/ML IJ SOLN
2.5000 mg | Freq: Once | INTRAMUSCULAR | Status: AC
  Administered 2021-08-28: 04:00:00 2.5 mg via INTRAVENOUS
  Filled 2021-08-28: qty 2

## 2021-08-28 MED ORDER — DIAZEPAM 5 MG/ML IJ SOLN
2.5000 mg | Freq: Once | INTRAMUSCULAR | Status: AC
  Administered 2021-08-28: 03:00:00 2.5 mg via INTRAVENOUS
  Filled 2021-08-28: qty 2

## 2021-08-28 MED ORDER — GADOBUTROL 1 MMOL/ML IV SOLN
10.0000 mL | Freq: Once | INTRAVENOUS | Status: AC | PRN
  Administered 2021-08-28: 01:00:00 10 mL via INTRAVENOUS

## 2021-08-28 MED ORDER — WARFARIN - PHARMACIST DOSING INPATIENT: Freq: Every day | Status: DC

## 2021-08-28 MED ORDER — GABAPENTIN 300 MG PO CAPS
600.0000 mg | ORAL_CAPSULE | Freq: Every day | ORAL | Status: DC
  Administered 2021-08-28 – 2021-09-02 (×6): 600 mg via ORAL
  Filled 2021-08-28 (×6): qty 2

## 2021-08-28 MED ORDER — WARFARIN SODIUM 5 MG PO TABS
5.0000 mg | ORAL_TABLET | Freq: Every day | ORAL | Status: DC
  Administered 2021-08-28: 17:00:00 5 mg via ORAL
  Filled 2021-08-28: qty 1

## 2021-08-28 MED ORDER — ACETAMINOPHEN 325 MG PO TABS
650.0000 mg | ORAL_TABLET | Freq: Four times a day (QID) | ORAL | Status: DC | PRN
  Administered 2021-08-28 – 2021-08-30 (×3): 650 mg via ORAL
  Filled 2021-08-28 (×3): qty 2

## 2021-08-28 MED ORDER — MECLIZINE HCL 25 MG PO TABS
25.0000 mg | ORAL_TABLET | Freq: Three times a day (TID) | ORAL | Status: DC | PRN
  Administered 2021-08-28: 12:00:00 25 mg via ORAL
  Filled 2021-08-28: qty 1

## 2021-08-28 MED ORDER — DM-GUAIFENESIN ER 30-600 MG PO TB12
1.0000 | ORAL_TABLET | Freq: Two times a day (BID) | ORAL | Status: DC | PRN
  Filled 2021-08-28: qty 1

## 2021-08-28 MED ORDER — DIPHENHYDRAMINE HCL 25 MG PO CAPS: 25.0000 mg | ORAL_CAPSULE | Freq: Every evening | ORAL | Status: DC | PRN

## 2021-08-28 NOTE — ED Notes (Signed)
Patient is resting comfortably. Wife at [facility]

## 2021-08-28 NOTE — ED Notes (Signed)
FAMILY CONTACT: LAVIN PETTEWAY [phone]

## 2021-08-28 NOTE — Evaluation (Signed)
Physical Therapy Evaluation Patient Details Name: Don Ruiz MRN: [id number] DOB: [birth year] Today's Date: 08/28/2021  History of Present Illness  70 y.o. male presents to [facility] hospital on 08/27/2021 with hearing loss in left ear, nausea, vomiting, dizziness. MRI negative for acute findings. PMH includes afb, HTN, AAA, GERD, R ear sensorineural hearing loss.  Clinical Impression  Pt presents to PT with significant dizziness, nausea, and vomiting with minimal head movement. PT notes a positive dix-hallpike test of L side, with torsional nystagmus. Epley maneuver is unsuccessful in relieving symptoms at this time, however the patient demonstrates difficulty following commands for test due to hearing loss. Pt declines further mobility due to nausea and vomiting. Pt will benefit from continued acute PT services to provide further treatment for vestibular function, and to progress to out of bed mobility. PT recommends discharge home with outpatient vestibular PT, however pt will need to demonstrate improved mobility in the acute setting before this becomes a safe discharge plan.      Recommendations for follow up therapy are one component of a multi-disciplinary discharge planning process, led by the attending physician.  Recommendations may be updated based on patient status, additional functional criteria and insurance authorization.  Follow Up Recommendations Outpatient PT (vestibular PT)    Assistance Recommended at Discharge Frequent or constant Supervision/Assistance  Functional Status Assessment Patient has had a recent decline in their functional status and demonstrates the ability to make significant improvements in function in a reasonable and predictable amount of time.  Equipment Recommendations  Rolling walker (2 wheels)    Recommendations for Other Services       Precautions / Restrictions Precautions Precautions: Fall Precaution Comments: torsional nystagmus with rolling  left Restrictions Weight Bearing Restrictions: No      Mobility  Bed Mobility Overal bed mobility: Needs Assistance Bed Mobility: Rolling;Sidelying to Sit Rolling: Min guard Sidelying to sit: Min assist            Transfers Overall transfer level:  (deferred due to nausea and vomiting, pt requesting no further mobility)                      Ambulation/Gait                  Stairs            Wheelchair Mobility    Modified Rankin (Stroke Patients Only)       Balance Overall balance assessment: Needs assistance Sitting-balance support: Single extremity supported;Bilateral upper extremity supported;Feet unsupported Sitting balance-Leahy Scale: Fair                                       Pertinent Vitals/Pain Pain Assessment: No/denies pain    Home Living Family/patient expects to be discharged to:: Private residence Living Arrangements: Spouse/significant other Available Help at Discharge: Family Type of Home: House Home Access: Stairs to enter Entrance Stairs-Rails: Can reach both Entrance Stairs-Number of Steps: 3   Home Layout: Multi-level;Able to live on main level with bedroom/bathroom Home Equipment: None      Prior Function Prior Level of Function : Independent/Modified Independent;Driving             Mobility Comments: works out, walks dogs, Psychologist, educational        Extremity/Trunk Assessment   Upper Extremity Assessment Upper Extremity Assessment: Overall WFL for tasks assessed  Lower Extremity Assessment Lower Extremity Assessment: Overall WFL for tasks assessed    Cervical / Trunk Assessment Cervical / Trunk Assessment: Normal  Communication   Communication: HOH (very hard of hearing, PT utilizing written or typed communication for majority of session)  Cognition Arousal/Alertness: Awake/alert Behavior During Therapy: WFL for tasks assessed/performed Overall Cognitive Status:  Within Functional Limits for tasks assessed                                          General Comments General comments (skin integrity, edema, etc.): VSS on RA, pt with nausea and vomiting with mobility. Dix hallpike right negative, dix-hallpike left positive with torsional nystagmus, significant symptoms of room spinning and vomiting. Epley unsuccessful in relieveing symptoms, although th pt with difficulty hearing verbal instructions and unable to maintain final position for >15 seconds due to severity of symptoms    Exercises     Assessment/Plan    PT Assessment Patient needs continued PT services  PT Problem List Decreased balance;Decreased mobility;Decreased activity tolerance;Decreased knowledge of precautions       PT Treatment Interventions DME instruction;Gait training;Stair training;Functional mobility training;Therapeutic activities;Therapeutic exercise;Balance training;Neuromuscular re-education;Patient/family education    PT Goals (Current goals can be found in the Care Plan section)  Acute Rehab PT Goals Patient Stated Goal: to relieve dizziness and return to independence PT Goal Formulation: With patient Time For Goal Achievement: 09/11/21 Potential to Achieve Goals: Good    Frequency Min 3X/week   Barriers to discharge        Co-evaluation               AM-PAC PT "6 Clicks" Mobility  Outcome Measure Help needed turning from your back to your side while in a flat bed without using bedrails?: A Little Help needed moving from lying on your back to sitting on the side of a flat bed without using bedrails?: A Little Help needed moving to and from a bed to a chair (including a wheelchair)?: Total Help needed standing up from a chair using your arms (e.g., wheelchair or bedside chair)?: Total Help needed to walk in hospital room?: Total Help needed climbing 3-5 steps with a railing? : Total 6 Click Score: 10    End of Session   Activity  Tolerance: Treatment limited secondary to medical complications (Comment) (nausea, vomiting, dizziness) Patient left: in bed;with call bell/phone within reach Nurse Communication: Mobility status PT Visit Diagnosis: BPPV BPPV - Right/Left : Left    Time: 6644-0347 PT Time Calculation (min) (ACUTE ONLY): 34 min   Charges:   PT Evaluation $PT Eval Low Complexity: 1 Low PT Treatments $Canalith Rep Proc: 8-22 mins        Zenaida Niece, PT, DPT Acute Rehabilitation Pager: [phone] Office [phone]   Zenaida Niece 08/28/2021, 9:17 AM

## 2021-08-28 NOTE — Progress Notes (Signed)
Patient transported to unit by stretcher with ED RN and wife. Pt transferred to bed from stretcher by sliding over himself with minimal assistance. Pt normally ambulates independently but is currently experiencing nausea and dizziness with movement. Pt is A&Ox4 and HOH. Pt and wife oriented to room and call bell left in reach.

## 2021-08-28 NOTE — ED Provider Notes (Signed)
I assumed care of this patient.  Please see previous provider note for further details of Hx, PE.  Briefly patient is a 70 y.o. male who presented with severe vertigo following an intratympanic steroid injection. Pending MRI.  MRI negative for any masses or stroke. Attempted to treat symptomatically with Compazine, Antivert which provided minimal relief Given Valium, which provided no additional relief. Will give a second dose of Valium and IV fluids. Given the fact that the patient is on chronic anticoagulation and is extremely symptomatic and even with slight movement, he will need to be admitted for further management.  Appreciate hospitalist admission      Fatima Blank, MD 08/28/21 [phone]

## 2021-08-28 NOTE — ED Notes (Signed)
PT at bedside.

## 2021-08-28 NOTE — ED Notes (Signed)
Pt back from MRI via stretcher and in NAD. Wife at [facility]

## 2021-08-28 NOTE — Progress Notes (Signed)
70 year old with history of atrial fibrillation, factor V Leiden deficiency, HTN, abdominal aortic aneurysm, GERD, sensorineural hearing deafness in the right ear and complete hearing loss in the left presents with comes to the hospital complains of significant dizziness.  Patient states he went to [facility] ENT surgeon day prior to admission had had steroid injection and intratympanic left ear by Dr. Thornell Mule.  Since then he has had significant amount of dizziness, nausea and vomiting especially with any head movement.  He has been started on meclizine and scopolamine patch.  MRI of the brain was negative.  Also spoke with Dr. Barnetta Chapel as this type of dizziness is expected after steroid injection but typically for 30 minutes or so but if lasting any longer, trial of Valium can be helpful.  When I saw the patient was still quite dizzy in the ED especially with any type of head movement.  Vital signs are stable Patient is a mild distress due to feeling of nausea and dizziness but overall no other complaints.  He is very hard of hearing.  Clear to auscultation bilaterally.  Abdomen exam is benign.  Normal sinus rhythm.  Assessment and plan:  Vertigo with sensorineural hearing loss in both ears-follows outpatient with Dr. Elwyn Reach from ENT.  Recently had steroid injection and the stable symptoms are expected.  MRI of the brain is negative.  Continue prednisone taper.  Meclizine, scopolamine patch has been added.  Dr. Barnetta Chapel was curb sided from ENT who suggested trial of Valium.  Atrial fibrillation and factor V Leiden mutation-continue Coumadin.  Pharmacy to dose  Essential hypertension-continue home medicine.  IV meds as needed  Abdominal aortic aneurysm status postrepair-follow-up outpatient  Hyperlipidemia-statin  Time spent-35 minutes  Gerlean Ren MD [facility]

## 2021-08-28 NOTE — Progress Notes (Signed)
OT Cancellation Note  Patient Details Name: Don Ruiz MRN: [id number] DOB: 1951-03-16   Cancelled Treatment:    Reason Eval/Treat Not Completed: Medical issues which prohibited therapy. Pt unable to tolerate head movement or positional changes at this point.  Will reattempt as tolerance for activity improves.  Nilsa Nutting., OTR/L Acute Rehabilitation Services Pager [phone] Office [phone]   Lucille Passy M 08/28/2021, 12:55 PM

## 2021-08-28 NOTE — Progress Notes (Signed)
ANTICOAGULATION CONSULT NOTE - Initial Consult  Pharmacy Consult for Coumadin Indication:  Afib and h/o recurrent/chronic VTE w/ factor V Leiden  Allergies  Allergen Reactions   Niacin And Related     Hepatitis   Phisohex [Hexachlorophene]     rash   Topamax [Topiramate]     suicidal   Vital Signs: BP: 136/71 (12/24 0530) Pulse Rate: 66 (12/24 0530)  Labs: Recent Labs    08/27/21 2005  HGB 14.3  HCT 43.5  PLT 209  APTT 36  LABPROT 25.4*  INR 2.3*  CREATININE 1.19    Medical History: Past Medical History:  Diagnosis Date   Ankle impingement syndrome    Right ankle from bone spur   Antiphospholipid antibody syndrome (HCC) 06/12/2012   Coagulopathy ([place]) 06/12/2012   DVT (deep venous thrombosis) ([place]) 1990, 2002   chronic hypercoagulobility.    DVT, recurrent, lower extremity, chronic 06/12/2012   1992   Dysrhythmia    AF   GERD (gastroesophageal reflux disease)    H/O tinnitus    Heterozygous factor V Leiden mutation ([place]) 06/12/2012   Hyperlipidemia    Migraine headache    Protein S deficiency ([place]) 06/12/2012   Prothrombin gene mutation ([place]) 06/12/2012   Pulmonary embolism ([place])    Pulmonary embolism, bilateral ([place]) 06/12/2012   July, 2002   PVC (premature ventricular contraction)    Varicose veins 06/12/2012   RLE   Wears hearing aid     Assessment: 70yo male c/o sudden loss of hearing in L ear and severe vertigo, to continue Coumadin for Afib and h/o VTE/factor V Leiden; current INR at goal with last dose taken 12/23.  Goal of Therapy:  INR 2-3   Plan:  Coumadin 5mg  daily. Monitor INR.  Wynona Neat, PharmD, BCPS  08/28/2021,6:20 AM

## 2021-08-28 NOTE — ED Notes (Signed)
Explained to pt and wife that provider wants to see if he can ambulate.  Raised HOB and pt immediately began to groan and state that he felt very nauseated.  States he cannot walk withouth feeling nauseated.  Will leave pt with HOB elevated for a brief period then return to attempt walking.

## 2021-08-28 NOTE — H&P (Addendum)
History and Physical    Don Ruiz HGD:[id number] DOB: [birth year] DOA: 08/27/2021  PCP: Don Orn, MD  Patient coming from: Home.  Chief Complaint: Dizziness.  HPI: Don Ruiz is a 70 y.o. male with history of atrial fibrillation, factor V Leyden mutation, hypertension, abdominal aortic aneurysm repair, GERD who has had prior history of sensorineural deafness of the right ear presently wearing a hearing aid started experiencing complete loss of hearing on the left ear when he woke up from sleep yesterday morning.  Patient had [facility] surgeon yesterday with Dr.Krauss and was evaluated.  Patient was given an injection dexamethasone and intratympanic on the left ear.  Advised to take prednisone p.o. for the next few days.  And follow-up with the ENT surgeon.  Patient prior to this has had been having some nausea vomiting and dizziness which has progressively got worse through the day.  Decided to come to the ER.  Denies any weakness of the upper or lower extremities.  ED Course: In the ER MRI of the brain does not show anything acute.  Despite giving meclizine diazepam patient still very vertiginous and difficult to ambulate admitted for further observation.  Labs show blood glucose of 171 CBC unremarkable INR is 2.3 COVID test negative.  Review of Systems: As per HPI, rest all negative.   Past Medical History:  Diagnosis Date   Ankle impingement syndrome    Right ankle from bone spur   Antiphospholipid antibody syndrome ([place]) 06/12/2012   Coagulopathy ([place]) 06/12/2012   DVT (deep venous thrombosis) ([place]) 1990, 2002   chronic hypercoagulobility.    DVT, recurrent, lower extremity, chronic 06/12/2012   1992   Dysrhythmia    AF   GERD (gastroesophageal reflux disease)    H/O tinnitus    Heterozygous factor V Leiden mutation ([place]) 06/12/2012   Hyperlipidemia    Migraine headache    Protein S deficiency ([place]) 06/12/2012   Prothrombin gene mutation  ([place]) 06/12/2012   Pulmonary embolism ([place])    Pulmonary embolism, bilateral ([place]) 06/12/2012   July, 2002   PVC (premature ventricular contraction)    Varicose veins 06/12/2012   RLE   Wears hearing aid     Past Surgical History:  Procedure Laterality Date   ABDOMINAL AORTIC ANEURYSM REPAIR  12/14   [facility]   ANKLE SURGERY  2005   right   APPENDECTOMY     ARTERY BIOPSY Left 08/12/2014   Procedure: LEFT TEMPORAL ARTERY BIOPSY ;  Surgeon: Don Keens, MD;  Location: [facility];  Service: General;  Laterality: Left;   CHOLECYSTECTOMY  2008   lap choli   COLONOSCOPY N/A 09/03/2013   Procedure: COLONOSCOPY;  Surgeon: Don Fair, MD;  Location: [facility];  Service: Endoscopy;  Laterality: N/A;   FINGER SURGERY     Right 1st finger,  by Dr. Kalman Ruiz TENDON REPAIR  2013   left 5th finger by Dr. Caralyn Ruiz   INCISION AND DRAINAGE HIP Left 02/17/2021   Procedure: IRRIGATION AND DEBRIDEMENT LEFT THIGH;  Surgeon: Don Leitz, MD;  Location: [facility];  Service: Orthopedics;  Laterality: Left;   PATELLA RELEASE AND MANIPULATION  1992   Right [place]     reports that he has never smoked. He has never used smokeless tobacco. He reports that he does not drink alcohol and does not use drugs.  Allergies  Allergen Reactions   Niacin And Related     Hepatitis   Phisohex [Hexachlorophene]  rash   Topamax [Topiramate]     suicidal    Family History  Problem Relation Age of Onset   Cancer Mother        Breast cancer   Hypertension Mother    Diabetes Mother    Fibromyalgia Mother    Stroke Mother    Stroke Father    Heart disease Father    Alcohol abuse Sister    Hyperlipidemia Sister    Hypertension Sister    Heart disease Brother        CABG at 48   Stroke Brother    Pulmonary embolism Brother    Heart disease Other    Hyperlipidemia Other    Heart disease Brother        MI at 95 CABG   Heart disease Brother        CABG,   MI at 34   Stroke  Sister 46   Heart disease Sister    Cancer Sister        Breast Cancer   Other Sister        acoustic tumor   Deep vein thrombosis Sister     Prior to Admission medications   Medication Sig Start Date End Date Taking? Authorizing Provider  celecoxib (CELEBREX) 200 MG capsule Take 200 mg by mouth daily as needed. For pain   Yes [provider]  diphenhydrAMINE (BENADRYL) 25 MG tablet Take 25 mg by mouth as needed for sleep.   Yes [provider]  diphenhydramine-acetaminophen (TYLENOL PM) 25-500 MG TABS tablet Take 2 tablets by mouth at bedtime as needed (for sleep).   Yes [provider]  ezetimibe (ZETIA) 10 MG tablet Take 1 tablet by mouth daily. 01/27/17  Yes [provider]  gabapentin (NEURONTIN) 600 MG tablet Take 300-600 mg by mouth See admin instructions. 600 in the morning and 300 at night   Yes [provider]  Multiple Vitamin (MULITIVITAMIN WITH MINERALS) TABS Take 1 tablet by mouth daily.   Yes [provider]  nitroGLYCERIN (NITROSTAT) 0.3 MG SL tablet Place 0.3 mg under the tongue every 5 (five) minutes as needed for chest pain.   Yes [provider]  pravastatin (PRAVACHOL) 80 MG tablet Take 80 mg by mouth daily.   Yes [provider]  psyllium (METAMUCIL) 58.6 % packet Take 1 packet by mouth daily.   Yes [provider]  ramipril (ALTACE) 10 MG capsule Take 10 mg by mouth daily. 06/12/21  Yes [provider]  warfarin (COUMADIN) 5 MG tablet Take 5 mg by mouth daily. 5 mg every day.   Yes [provider]  oxyCODONE-acetaminophen (PERCOCET/ROXICET) 5-325 MG tablet Take 1-2 tablets by mouth every 6 (six) hours as needed for severe pain. Patient not taking: Reported on 08/27/2021 02/17/21   Don Fleet, PA-C    Physical Exam: Constitutional: Moderately built and nourished. Vitals:   08/28/21 0245 08/28/21 0330 08/28/21 0400 08/28/21 0530  BP: (!) 145/88 (!) 141/85 (!) 152/85  136/71  Pulse: 72 73 79 66  Resp: 16 16 16 14   SpO2: 95% 95% 94% 95%   Eyes: Anicteric no pallor. ENMT: No discharge from the ears eyes nose and mouth. Neck: No mass felt.  No neck rigidity. Respiratory: No rhonchi or crepitations. Cardiovascular: S1-S2 heard. Abdomen: Soft nontender bowel sound present. Musculoskeletal: No edema. Skin: No rash. Neurologic: Alert awake oriented time place and person.  Moving all extremities. Psychiatric: Appears normal.  Normal affect.   Labs on Admission:  I have personally reviewed following labs and imaging studies  CBC: Recent Labs  Lab 08/27/21 2005  WBC 9.6  NEUTROABS 8.5*  HGB 14.3  HCT 43.5  MCV 87.7  PLT 734   Basic Metabolic Panel: Recent Labs  Lab 08/27/21 2005  NA 136  K 3.8  CL 105  CO2 21*  GLUCOSE 171*  BUN 23  CREATININE 1.19  CALCIUM 8.7*   GFR: CrCl cannot be calculated (Unknown ideal weight.). Liver Function Tests: No results for input(s): AST, ALT, ALKPHOS, BILITOT, PROT, ALBUMIN in the last 168 hours. No results for input(s): LIPASE, AMYLASE in the last 168 hours. No results for input(s): AMMONIA in the last 168 hours. Coagulation Profile: Recent Labs  Lab 08/27/21 2005  INR 2.3*   Cardiac Enzymes: No results for input(s): CKTOTAL, CKMB, CKMBINDEX, TROPONINI in the last 168 hours. BNP (last 3 results) No results for input(s): PROBNP in the last 8760 hours. HbA1C: No results for input(s): HGBA1C in the last 72 hours. CBG: No results for input(s): GLUCAP in the last 168 hours. Lipid Profile: No results for input(s): CHOL, HDL, LDLCALC, TRIG, CHOLHDL, LDLDIRECT in the last 72 hours. Thyroid Function Tests: No results for input(s): TSH, T4TOTAL, FREET4, T3FREE, THYROIDAB in the last 72 hours. Anemia Panel: No results for input(s): VITAMINB12, FOLATE, FERRITIN, TIBC, IRON, RETICCTPCT in the last 72 hours. Urine analysis:    Component Value Date/Time   COLORURINE YELLOW 02/04/2017 1200    APPEARANCEUR CLEAR 02/04/2017 1200   LABSPEC 1.021 02/04/2017 1200   PHURINE 5.0 02/04/2017 1200   GLUCOSEU NEGATIVE 02/04/2017 1200   HGBUR NEGATIVE 02/04/2017 1200   BILIRUBINUR NEGATIVE 02/04/2017 1200   KETONESUR NEGATIVE 02/04/2017 1200   PROTEINUR NEGATIVE 02/04/2017 1200   NITRITE NEGATIVE 02/04/2017 1200   LEUKOCYTESUR NEGATIVE 02/04/2017 1200   Sepsis Labs: @LABRCNTIP (procalcitonin:4,lacticidven:4) ) Recent Results (from the past 240 hour(s))  Resp Panel by RT-PCR (Flu A&B, Covid) Nasopharyngeal Swab     Status: None   Collection Time: 08/28/21  3:56 AM   Specimen: Nasopharyngeal Swab; Nasopharyngeal(NP) swabs in vial transport medium  Result Value Ref Range Status   SARS Coronavirus 2 by RT PCR NEGATIVE NEGATIVE Final    Comment: (NOTE) SARS-CoV-2 target nucleic acids are NOT DETECTED.  The SARS-CoV-2 RNA is generally detectable in upper respiratory specimens during the acute phase of infection. The lowest concentration of SARS-CoV-2 viral copies this assay can detect is 138 copies/mL. A negative result does not preclude SARS-Cov-2 infection and should not be used as the sole basis for treatment or other patient management decisions. A negative result may occur with  improper specimen collection/handling, submission of specimen other than nasopharyngeal swab, presence of viral mutation(s) within the areas targeted by this assay, and inadequate number of viral copies(<138 copies/mL). A negative result must be combined with clinical observations, patient history, and epidemiological information. The expected result is Negative.  Fact Sheet for Patients:  EntrepreneurPulse.com.au  Fact Sheet for Healthcare Providers:  IncredibleEmployment.be  This test is no t yet approved or cleared by the [place] FDA and  has been authorized for detection and/or diagnosis of SARS-CoV-2 by FDA under an Emergency Use Authorization (EUA). This  EUA will remain  in effect (meaning this test can be used) for the duration of the COVID-19 declaration under Section 564(b)(1) of the Act, 21 U.S.C.section 360bbb-3(b)(1), unless the authorization is terminated  or revoked sooner.       Influenza A by PCR NEGATIVE NEGATIVE Final   Influenza B  by PCR NEGATIVE NEGATIVE Final    Comment: (NOTE) The Xpert Xpress SARS-CoV-2/FLU/RSV plus assay is intended as an aid in the diagnosis of influenza from Nasopharyngeal swab specimens and should not be used as a sole basis for treatment. Nasal washings and aspirates are unacceptable for Xpert Xpress SARS-CoV-2/FLU/RSV testing.  Fact Sheet for Patients: EntrepreneurPulse.com.au  Fact Sheet for Healthcare Providers: IncredibleEmployment.be  This test is not yet approved or cleared by the [place] FDA and has been authorized for detection and/or diagnosis of SARS-CoV-2 by FDA under an Emergency Use Authorization (EUA). This EUA will remain in effect (meaning this test can be used) for the duration of the COVID-19 declaration under Section 564(b)(1) of the Act, 21 U.S.C. section 360bbb-3(b)(1), unless the authorization is terminated or revoked.  Performed at [facility], [place] [address], [place], [place] [address]      Radiological Exams on Admission: CT Head Wo Contrast  Result Date: 08/27/2021 CLINICAL DATA:  Acute neuro deficit.  Sudden hearing loss left ear. EXAM: CT HEAD WITHOUT CONTRAST TECHNIQUE: Contiguous axial images were obtained from the base of the skull through the vertex without intravenous contrast. COMPARISON:  CT head 03/03/2014 FINDINGS: Brain: No evidence of acute infarction, hemorrhage, hydrocephalus, extra-axial collection or mass lesion/mass effect. Vascular: Negative for hyperdense vessel. Skull: Negative Sinuses/Orbits: Paranasal sinuses clear. Bilateral cataract extraction Other: None IMPRESSION: Negative CT head  Electronically Signed   By: Franchot Gallo M.D.   On: 08/27/2021 19:55   MR BRAIN W WO CONTRAST  Result Date: 08/28/2021 CLINICAL DATA:  Dizziness, arrhythmia or new vasoactive medication vertigo, hearing loss, concern for stroke or retrocochlear lesion. Sudden loss of hearing in L ear this morning. Pt had similar event 12 years ago in R ear, never regained hearing. EXAM: MRI HEAD WITHOUT AND WITH CONTRAST TECHNIQUE: Multiplanar, multiecho pulse sequences of the brain and surrounding structures were obtained without and with intravenous contrast. CONTRAST:  40mL GADAVIST GADOBUTROL 1 MMOL/ML IV SOLN COMPARISON:  Brain MRI 12/31/2009 FINDINGS: Brain: No acute infarct, mass effect or extra-axial collection. No acute or chronic hemorrhage. There is multifocal hyperintense T2-weighted signal within the white matter. Generalized volume loss without a clear lobar predilection. The midline structures are normal. Vascular: Major flow voids are preserved. Skull and upper cervical spine: Normal calvarium and skull base. Visualized upper cervical spine and soft tissues are normal. Sinuses/Orbits:No paranasal sinus fluid levels or advanced mucosal thickening. No mastoid or middle ear effusion. Normal orbits. IMPRESSION: 1. No acute intracranial abnormality. 2. Findings of mild chronic microvascular ischemia and generalized volume loss. Electronically Signed   By: Ulyses Jarred M.D.   On: 08/28/2021 01:04    EKG: Independently reviewed.  Normal sinus rhythm.  Assessment/Plan Principal Problem:   Vertigo Active Problems:   Aneurysm of abdominal vessel   Blood clotting disorder (HCC)   Heterozygous factor V Leiden mutation (HCC)   Chronic anticoagulation   PAF (paroxysmal atrial fibrillation) (HCC)    Vertigo with sensorineural hearing loss now in both ears -likely related to vestibular process.  Patient had been given 1 dose of dexamethasone intratympanic by patient's ENT surgeon at [facility].  Patient to be  continued on p.o. prednisone 20 mg p.o. 3 times daily for the next 7 days following which tapering.  Get physical therapy consult and also we will keep patient on as needed meclizine. History of A. fib and factor V Leyden mutation with hypercoagulable status on Coumadin which will be dosed per pharmacy. Hypertension on ACE inhibitor  and keep patient as needed IV hydralazine. History of abdominal aortic aneurysm s/p repair denies any abdominal pain. Hyperlipidemia on statins.   DVT prophylaxis: Coumadin. Code Status: Full code. Family Communication: Patient's wife at the bedside. Disposition Plan: Home when stable. Consults called: Physical therapy. Admission status: Observation.   Rise Patience MD [facility] Pager [phone].  If 7PM-7AM, please contact night-coverage [email or web address] Password [facility]  08/28/2021, 5:54 AM

## 2021-08-29 DIAGNOSIS — R42 Dizziness and giddiness: Secondary | ICD-10-CM | POA: Diagnosis not present

## 2021-08-29 LAB — BASIC METABOLIC PANEL
Anion gap: 8 (ref 5–15)
BUN: 20 mg/dL (ref 8–23)
CO2: 25 mmol/L (ref 22–32)
Calcium: 8.9 mg/dL (ref 8.9–10.3)
Chloride: 105 mmol/L (ref 98–111)
Creatinine, Ser: 1.21 mg/dL (ref 0.61–1.24)
GFR, Estimated: >60 mL/min (ref >60)
Glucose, Bld: 127 mg/dL — ABNORMAL HIGH (ref 70–99)
Potassium: 4.1 mmol/L (ref 3.5–5.1)
Sodium: 138 mmol/L (ref 135–145)

## 2021-08-29 LAB — CBC
HCT: 44.5 % (ref 39.0–52.0)
Hemoglobin: 14.2 g/dL (ref 13.0–17.0)
MCH: 27.8 pg (ref 26.0–34.0)
MCHC: 31.9 g/dL (ref 30.0–36.0)
MCV: 87.1 fL (ref 80.0–100.0)
Platelets: 185 10*3/uL (ref 150–400)
RBC: 5.11 MIL/uL (ref 4.22–5.81)
RDW: 14.3 % (ref 11.5–15.5)
WBC: 11.7 10*3/uL — ABNORMAL HIGH (ref 4.0–10.5)
nRBC: 0 % (ref 0.0–0.2)

## 2021-08-29 LAB — MAGNESIUM: Magnesium: 2.3 mg/dL (ref 1.7–2.4)

## 2021-08-29 LAB — PROTIME-INR
INR: 3 — ABNORMAL HIGH (ref 0.8–1.2)
Prothrombin Time: 30.8 seconds — ABNORMAL HIGH (ref 11.4–15.2)

## 2021-08-29 MED ORDER — WARFARIN SODIUM 4 MG PO TABS
4.0000 mg | ORAL_TABLET | Freq: Once | ORAL | Status: AC
  Administered 2021-08-29: 15:00:00 4 mg via ORAL
  Filled 2021-08-29: qty 1

## 2021-08-29 NOTE — Progress Notes (Signed)
PROGRESS NOTE    Atom Solivan  XIP:[id number] DOB: 1951-03-05 DOA: 08/27/2021 PCP: Lavone Orn, MD   Brief Narrative:  70 year old with history of atrial fibrillation, factor V Leiden deficiency, HTN, abdominal aortic aneurysm, GERD, sensorineural hearing deafness in the right ear and complete hearing loss in the left presents with comes to the hospital complains of significant dizziness.  Patient states he went to [facility] ENT surgeon day prior to admission had had steroid injection and intratympanic left ear by Dr. Thornell Mule.  Since then he has had significant amount of dizziness, nausea and vomiting especially with any head movement.  He has been started on meclizine and scopolamine patch.  MRI of the brain was negative.  Also spoke with Dr. Barnetta Chapel as this type of dizziness is expected after steroid injection but typically for 30 minutes or so but if lasting any longer, trial of Valium can be helpful.   Assessment & Plan:   Principal Problem:   Vertigo Active Problems:   Aneurysm of abdominal vessel   Blood clotting disorder (HCC)   Heterozygous factor V Leiden mutation (HCC)   Chronic anticoagulation   PAF (paroxysmal atrial fibrillation) (HCC)   Vertigo with sensorineural hearing loss in both ears-follows outpatient with Dr. Elwyn Reach from ENT.  Recently had steroid injection and the stable symptoms are expected.  MRI of the brain is negative.  Continue prednisone taper.  Meclizine, scopolamine patch has been added.  Dr. Barnetta Chapel was curb sided from ENT who suggested trial of Valium.  Continue Valium today.  Today symptoms are slightly better but still persist especially severe dizziness with getting out of bed.   Atrial fibrillation and factor V Leiden mutation-continue Coumadin.  Pharmacy to dose   Essential hypertension-continue home medicine.  IV meds as needed   Abdominal aortic aneurysm status postrepair-follow-up outpatient   Hyperlipidemia-statin      DVT  prophylaxis: On Coumadin Code Status: Full code Family Communication:  Paulino Door; no answer.    Still having severe dizziness with getting up. Feels unsafe.      Subjective: Nausea has resolved.  Does not feel dizzy with head movement but does get very dizzy with getting out of bed causing a unsteady gait.    Examination:  General exam: Appears calm and comfortable, very hard of hearing Respiratory system: Clear to auscultation. Respiratory effort normal. Cardiovascular system: S1 & S2 heard, RRR. No JVD, murmurs, rubs, gallops or clicks. No pedal edema. Gastrointestinal system: Abdomen is nondistended, soft and nontender. No organomegaly or masses felt. Normal bowel sounds heard. Central nervous system: Alert and oriented. No focal neurological deficits. Extremities: Symmetric 5 x 5 power. Skin: No rashes, lesions or ulcers Psychiatry: Judgement and insight appear normal. Mood & affect appropriate.     Objective: Vitals:   08/29/21 0000 08/29/21 0400 08/29/21 0500 08/29/21 0811  BP: 121/81 113/68  136/77  Pulse: 68 66 65 (!) 59  Resp: 20 15 20 16   Temp: 98.5 F (36.9 C) (!) 97.3 F (36.3 C)  98.1 F (36.7 C)  TempSrc: Oral Axillary  Oral  SpO2: 92% 90% 93% 92%  Weight:      Height:        Intake/Output Summary (Last 24 hours) at 08/29/2021 1138 Last data filed at 08/29/2021 0934 Gross per 24 hour  Intake 480 ml  Output 1350 ml  Net -870 ml   Filed Weights   08/28/21 1541  Weight: 99.3 kg     Data Reviewed:   CBC: Recent Labs  Lab 08/27/21 2005 08/28/21 2342  WBC 9.6 11.7*  NEUTROABS 8.5*  --   HGB 14.3 14.2  HCT 43.5 44.5  MCV 87.7 87.1  PLT 209 628   Basic Metabolic Panel: Recent Labs  Lab 08/27/21 2005 08/28/21 2342  NA 136 138  K 3.8 4.1  CL 105 105  CO2 21* 25  GLUCOSE 171* 127*  BUN 23 20  CREATININE 1.19 1.21  CALCIUM 8.7* 8.9  MG  --  2.3   GFR: Estimated Creatinine Clearance: 69.8 mL/min (by C-G formula based on SCr of  1.21 mg/dL). Liver Function Tests: No results for input(s): AST, ALT, ALKPHOS, BILITOT, PROT, ALBUMIN in the last 168 hours. No results for input(s): LIPASE, AMYLASE in the last 168 hours. No results for input(s): AMMONIA in the last 168 hours. Coagulation Profile: Recent Labs  Lab 08/27/21 2005 08/28/21 2342  INR 2.3* 3.0*   Cardiac Enzymes: No results for input(s): CKTOTAL, CKMB, CKMBINDEX, TROPONINI in the last 168 hours. BNP (last 3 results) No results for input(s): PROBNP in the last 8760 hours. HbA1C: No results for input(s): HGBA1C in the last 72 hours. CBG: No results for input(s): GLUCAP in the last 168 hours. Lipid Profile: No results for input(s): CHOL, HDL, LDLCALC, TRIG, CHOLHDL, LDLDIRECT in the last 72 hours. Thyroid Function Tests: No results for input(s): TSH, T4TOTAL, FREET4, T3FREE, THYROIDAB in the last 72 hours. Anemia Panel: No results for input(s): VITAMINB12, FOLATE, FERRITIN, TIBC, IRON, RETICCTPCT in the last 72 hours. Sepsis Labs: No results for input(s): PROCALCITON, LATICACIDVEN in the last 168 hours.  Recent Results (from the past 240 hour(s))  Resp Panel by RT-PCR (Flu A&B, Covid) Nasopharyngeal Swab     Status: None   Collection Time: 08/28/21  3:56 AM   Specimen: Nasopharyngeal Swab; Nasopharyngeal(NP) swabs in vial transport medium  Result Value Ref Range Status   SARS Coronavirus 2 by RT PCR NEGATIVE NEGATIVE Final    Comment: (NOTE) SARS-CoV-2 target nucleic acids are NOT DETECTED.  The SARS-CoV-2 RNA is generally detectable in upper respiratory specimens during the acute phase of infection. The lowest concentration of SARS-CoV-2 viral copies this assay can detect is 138 copies/mL. A negative result does not preclude SARS-Cov-2 infection and should not be used as the sole basis for treatment or other patient management decisions. A negative result may occur with  improper specimen collection/handling, submission of specimen other than  nasopharyngeal swab, presence of viral mutation(s) within the areas targeted by this assay, and inadequate number of viral copies(<138 copies/mL). A negative result must be combined with clinical observations, patient history, and epidemiological information. The expected result is Negative.  Fact Sheet for Patients:  EntrepreneurPulse.com.au  Fact Sheet for Healthcare Providers:  IncredibleEmployment.be  This test is no t yet approved or cleared by the [place] FDA and  has been authorized for detection and/or diagnosis of SARS-CoV-2 by FDA under an Emergency Use Authorization (EUA). This EUA will remain  in effect (meaning this test can be used) for the duration of the COVID-19 declaration under Section 564(b)(1) of the Act, 21 U.S.C.section 360bbb-3(b)(1), unless the authorization is terminated  or revoked sooner.       Influenza A by PCR NEGATIVE NEGATIVE Final   Influenza B by PCR NEGATIVE NEGATIVE Final    Comment: (NOTE) The Xpert Xpress SARS-CoV-2/FLU/RSV plus assay is intended as an aid in the diagnosis of influenza from Nasopharyngeal swab specimens and should not be used as a sole basis for treatment. Nasal washings and  aspirates are unacceptable for Xpert Xpress SARS-CoV-2/FLU/RSV testing.  Fact Sheet for Patients: EntrepreneurPulse.com.au  Fact Sheet for Healthcare Providers: IncredibleEmployment.be  This test is not yet approved or cleared by the [place] FDA and has been authorized for detection and/or diagnosis of SARS-CoV-2 by FDA under an Emergency Use Authorization (EUA). This EUA will remain in effect (meaning this test can be used) for the duration of the COVID-19 declaration under Section 564(b)(1) of the Act, 21 U.S.C. section 360bbb-3(b)(1), unless the authorization is terminated or revoked.  Performed at [facility], [place] [address] [place], [place] [address]           Radiology Studies: CT Head Wo Contrast  Result Date: 08/27/2021 CLINICAL DATA:  Acute neuro deficit.  Sudden hearing loss left ear. EXAM: CT HEAD WITHOUT CONTRAST TECHNIQUE: Contiguous axial images were obtained from the base of the skull through the vertex without intravenous contrast. COMPARISON:  CT head 03/03/2014 FINDINGS: Brain: No evidence of acute infarction, hemorrhage, hydrocephalus, extra-axial collection or mass lesion/mass effect. Vascular: Negative for hyperdense vessel. Skull: Negative Sinuses/Orbits: Paranasal sinuses clear. Bilateral cataract extraction Other: None IMPRESSION: Negative CT head Electronically Signed   By: Franchot Gallo M.D.   On: 08/27/2021 19:55   MR BRAIN W WO CONTRAST  Result Date: 08/28/2021 CLINICAL DATA:  Dizziness, arrhythmia or new vasoactive medication vertigo, hearing loss, concern for stroke or retrocochlear lesion. Sudden loss of hearing in L ear this morning. Pt had similar event 12 years ago in R ear, never regained hearing. EXAM: MRI HEAD WITHOUT AND WITH CONTRAST TECHNIQUE: Multiplanar, multiecho pulse sequences of the brain and surrounding structures were obtained without and with intravenous contrast. CONTRAST:  42mL GADAVIST GADOBUTROL 1 MMOL/ML IV SOLN COMPARISON:  Brain MRI 12/31/2009 FINDINGS: Brain: No acute infarct, mass effect or extra-axial collection. No acute or chronic hemorrhage. There is multifocal hyperintense T2-weighted signal within the white matter. Generalized volume loss without a clear lobar predilection. The midline structures are normal. Vascular: Major flow voids are preserved. Skull and upper cervical spine: Normal calvarium and skull base. Visualized upper cervical spine and soft tissues are normal. Sinuses/Orbits:No paranasal sinus fluid levels or advanced mucosal thickening. No mastoid or middle ear effusion. Normal orbits. IMPRESSION: 1. No acute intracranial abnormality. 2. Findings of mild chronic microvascular  ischemia and generalized volume loss. Electronically Signed   By: Ulyses Jarred M.D.   On: 08/28/2021 01:04        Scheduled Meds:  calcium carbonate  1 tablet Oral TID WC   diazepam  5 mg Oral Q8H   ezetimibe  10 mg Oral Daily   gabapentin  300 mg Oral QHS   gabapentin  600 mg Oral Daily   meclizine  25 mg Oral TID   pravastatin  80 mg Oral Daily   predniSONE  20 mg Oral TID   ramipril  10 mg Oral Daily   scopolamine  1 patch Transdermal Q72H   warfarin  4 mg Oral ONCE-1600   Warfarin - Pharmacist Dosing Inpatient   Does not apply q1600   Continuous Infusions:   LOS: 0 days   Time spent= 35 mins    Samit Sylve Arsenio Loader, MD [facility]  If 7PM-7AM, please contact night-coverage  08/29/2021, 11:38 AM

## 2021-08-29 NOTE — Care Management Obs Status (Signed)
[place] NOTIFICATION   Patient Details  Name: Don Ruiz MRN: [id number] Date of Birth: [birth year]   Medicare Observation Status Notification Given:  Yes    Carles Collet, RN 08/29/2021, 2:56 PM

## 2021-08-29 NOTE — Progress Notes (Signed)
ANTICOAGULATION CONSULT NOTE - Initial Consult  Pharmacy Consult for Coumadin Indication:  Afib and h/o recurrent/chronic VTE w/ factor V Leiden  Allergies  Allergen Reactions   Niacin And Related     Hepatitis   Phisohex [Hexachlorophene]     rash   Topamax [Topiramate]     suicidal   Vital Signs: Temp: 97.3 F (36.3 C) (12/25 0400) Temp Source: Axillary (12/25 0400) BP: 113/68 (12/25 0400) Pulse Rate: 65 (12/25 0500)  Labs: Recent Labs    08/27/21 2005 08/28/21 2342  HGB 14.3 14.2  HCT 43.5 44.5  PLT 209 185  APTT 36  --   LABPROT 25.4* 30.8*  INR 2.3* 3.0*  CREATININE 1.19 1.21     Medical History: Past Medical History:  Diagnosis Date   Ankle impingement syndrome    Right ankle from bone spur   Antiphospholipid antibody syndrome (HCC) 06/12/2012   Coagulopathy ([place]) 06/12/2012   DVT (deep venous thrombosis) ([place]) 1990, 2002   chronic hypercoagulobility.    DVT, recurrent, lower extremity, chronic 06/12/2012   1992   Dysrhythmia    AF   GERD (gastroesophageal reflux disease)    H/O tinnitus    Heterozygous factor V Leiden mutation ([place]) 06/12/2012   Hyperlipidemia    Migraine headache    Protein S deficiency ([place]) 06/12/2012   Prothrombin gene mutation ([place]) 06/12/2012   Pulmonary embolism ([place])    Pulmonary embolism, bilateral ([place]) 06/12/2012   July, 2002   PVC (premature ventricular contraction)    Varicose veins 06/12/2012   RLE   Wears hearing aid     Assessment: 70yo male c/o sudden loss of hearing in L ear and severe vertigo, to continue Coumadin for Afib and h/o VTE/factor V Leiden; current INR at goal with last dose taken 12/23. PTA regimen: warfarin 5 mg daily   INR increased from 2.3 to 3.0. CBC stable. No s/sx of bleeding noted. Given INR is at the upper end of therapeutic, will slightly decrease dose to prevent from being supratherapeutic.   Goal of Therapy:  INR 2-3   Plan:  Warfarin 4mg  x1 Monitor INR and s/sx of bleeding  daily  Joseph Art, Pharm.D. PGY-1 Pharmacy Resident [phone] 08/29/2021 8:13 AM

## 2021-08-29 NOTE — Care Management (Signed)
°  Transition of Care [facility]) Screening Note   Patient Details  Name: Don Ruiz Date of Birth: 04-28-1951   Transition of Care [facility]) CM/SW Contact:    Carles Collet, RN Phone Number: 08/29/2021, 9:08 AM    Transition of Care Department [facility]) has reviewed patient and no TOC needs have been identified at this time. We will continue to monitor patient advancement through interdisciplinary progression rounds. If new patient transition needs arise, please place a TOC consult.

## 2021-08-30 DIAGNOSIS — I10 Essential (primary) hypertension: Secondary | ICD-10-CM | POA: Diagnosis present

## 2021-08-30 DIAGNOSIS — Z9049 Acquired absence of other specified parts of digestive tract: Secondary | ICD-10-CM | POA: Diagnosis not present

## 2021-08-30 DIAGNOSIS — Z86718 Personal history of other venous thrombosis and embolism: Secondary | ICD-10-CM | POA: Diagnosis not present

## 2021-08-30 DIAGNOSIS — R42 Dizziness and giddiness: Secondary | ICD-10-CM | POA: Diagnosis present

## 2021-08-30 DIAGNOSIS — Z86711 Personal history of pulmonary embolism: Secondary | ICD-10-CM | POA: Diagnosis not present

## 2021-08-30 DIAGNOSIS — Z83438 Family history of other disorder of lipoprotein metabolism and other lipidemia: Secondary | ICD-10-CM | POA: Diagnosis not present

## 2021-08-30 DIAGNOSIS — Z20822 Contact with and (suspected) exposure to covid-19: Secondary | ICD-10-CM | POA: Diagnosis present

## 2021-08-30 DIAGNOSIS — Z888 Allergy status to other drugs, medicaments and biological substances status: Secondary | ICD-10-CM | POA: Diagnosis not present

## 2021-08-30 DIAGNOSIS — Z7901 Long term (current) use of anticoagulants: Secondary | ICD-10-CM | POA: Diagnosis not present

## 2021-08-30 DIAGNOSIS — Z8249 Family history of ischemic heart disease and other diseases of the circulatory system: Secondary | ICD-10-CM | POA: Diagnosis not present

## 2021-08-30 DIAGNOSIS — K219 Gastro-esophageal reflux disease without esophagitis: Secondary | ICD-10-CM | POA: Diagnosis present

## 2021-08-30 DIAGNOSIS — I480 Paroxysmal atrial fibrillation: Secondary | ICD-10-CM | POA: Diagnosis present

## 2021-08-30 DIAGNOSIS — D6852 Prothrombin gene mutation: Secondary | ICD-10-CM | POA: Diagnosis present

## 2021-08-30 DIAGNOSIS — I714 Abdominal aortic aneurysm, without rupture, unspecified: Secondary | ICD-10-CM | POA: Diagnosis present

## 2021-08-30 DIAGNOSIS — H903 Sensorineural hearing loss, bilateral: Secondary | ICD-10-CM | POA: Diagnosis present

## 2021-08-30 DIAGNOSIS — H9122 Sudden idiopathic hearing loss, left ear: Secondary | ICD-10-CM | POA: Diagnosis present

## 2021-08-30 DIAGNOSIS — Z974 Presence of external hearing-aid: Secondary | ICD-10-CM | POA: Diagnosis not present

## 2021-08-30 DIAGNOSIS — E785 Hyperlipidemia, unspecified: Secondary | ICD-10-CM | POA: Diagnosis present

## 2021-08-30 DIAGNOSIS — Z79899 Other long term (current) drug therapy: Secondary | ICD-10-CM | POA: Diagnosis not present

## 2021-08-30 DIAGNOSIS — D6851 Activated protein C resistance: Secondary | ICD-10-CM | POA: Diagnosis present

## 2021-08-30 LAB — BASIC METABOLIC PANEL
Anion gap: 11 (ref 5–15)
BUN: 23 mg/dL (ref 8–23)
CO2: 25 mmol/L (ref 22–32)
Calcium: 8.8 mg/dL — ABNORMAL LOW (ref 8.9–10.3)
Chloride: 104 mmol/L (ref 98–111)
Creatinine, Ser: 1.17 mg/dL (ref 0.61–1.24)
GFR, Estimated: >60 mL/min (ref >60)
Glucose, Bld: 124 mg/dL — ABNORMAL HIGH (ref 70–99)
Potassium: 3.8 mmol/L (ref 3.5–5.1)
Sodium: 140 mmol/L (ref 135–145)

## 2021-08-30 LAB — MAGNESIUM: Magnesium: 2.3 mg/dL (ref 1.7–2.4)

## 2021-08-30 LAB — CBC
HCT: 45.5 % (ref 39.0–52.0)
Hemoglobin: 14.9 g/dL (ref 13.0–17.0)
MCH: 28.3 pg (ref 26.0–34.0)
MCHC: 32.7 g/dL (ref 30.0–36.0)
MCV: 86.3 fL (ref 80.0–100.0)
Platelets: 195 10*3/uL (ref 150–400)
RBC: 5.27 MIL/uL (ref 4.22–5.81)
RDW: 14.2 % (ref 11.5–15.5)
WBC: 12 10*3/uL — ABNORMAL HIGH (ref 4.0–10.5)
nRBC: 0 % (ref 0.0–0.2)

## 2021-08-30 LAB — PROTIME-INR
INR: 3.9 — ABNORMAL HIGH (ref 0.8–1.2)
Prothrombin Time: 38.5 seconds — ABNORMAL HIGH (ref 11.4–15.2)

## 2021-08-30 MED ORDER — DIMENHYDRINATE 50 MG PO TABS
50.0000 mg | ORAL_TABLET | Freq: Once | ORAL | Status: AC
  Administered 2021-08-30: 10:00:00 50 mg via ORAL
  Filled 2021-08-30: qty 1

## 2021-08-30 MED ORDER — DIMENHYDRINATE 50 MG/ML IJ SOLN: 50.0000 mg | Freq: Once | INTRAMUSCULAR | Status: DC

## 2021-08-30 MED ORDER — DIAZEPAM 5 MG PO TABS
10.0000 mg | ORAL_TABLET | Freq: Three times a day (TID) | ORAL | Status: AC
  Administered 2021-08-30 – 2021-09-02 (×9): 10 mg via ORAL
  Filled 2021-08-30 (×9): qty 2

## 2021-08-30 NOTE — Evaluation (Signed)
Occupational Therapy Evaluation Patient Details Name: Don Ruiz MRN: [id number] DOB: [birth year] Today's Date: 08/30/2021   History of Present Illness 70 y.o. male presents to [facility] hospital on 08/27/2021 with hearing loss in left ear, nausea, vomiting, dizziness. MRI negative for acute findings. PMH includes afb, HTN, AAA, GERD, R ear sensorineural hearing loss.   Clinical Impression   Don Ruiz was indep PTA and has a very active baseline. He lives in a multi-level home, 3 STE with his wife/family who can assist 24/7 as needed. Upon evaluation pt was limited by extreme dizziness with change in position and observed with nystagmus. He required increased time to recover after each position change. Overall he required close min guard for bed mobility and min A for short ambulation within the room with RW. He required up to mod A for LB ADLs due to dizziness with bending to his LB. Encouraged theraband exercises at bed level to preserve strength and promote safe exercise, pt and his wife verbalized understanding. Pt will benefit from continued acute OT. Recommend d/c home with vestibular follow up, likely no OT needs at d/c.      Recommendations for follow up therapy are one component of a multi-disciplinary discharge planning process, led by the attending physician.  Recommendations may be updated based on patient status, additional functional criteria and insurance authorization.   Follow Up Recommendations  No OT follow up (OP vestibular therapy)    Assistance Recommended at Discharge Frequent or constant Supervision/Assistance  Functional Status Assessment  Patient has had a recent decline in their functional status and demonstrates the ability to make significant improvements in function in a reasonable and predictable amount of time.  Equipment Recommendations  BSC/3in1 (RW - pending pt progress acutely)    Recommendations for Other Services       Precautions / Restrictions  Precautions Precautions: Fall Precaution Comments: torsional nystagmus with rolling left Restrictions Weight Bearing Restrictions: No      Mobility Bed Mobility Overal bed mobility: Needs Assistance Bed Mobility: Rolling;Sidelying to Sit;Sit to Supine Rolling: Min guard Sidelying to sit: Min guard   Sit to supine: Min assist   General bed mobility comments: increased time, no physical assist. mild LOB upon sitting but able to self correct    Transfers Overall transfer level: Needs assistance Equipment used: Rolling walker (2 wheels) Transfers: Sit to/from Stand Sit to Stand: Min assist           General transfer comment: for balance and safety, verbal cues      Balance Overall balance assessment: Needs assistance Sitting-balance support: Feet supported Sitting balance-Leahy Scale: Fair     Standing balance support: Bilateral upper extremity supported;Reliant on assistive device for balance Standing balance-Leahy Scale: Poor Standing balance comment: verbal cues for position and posture                           ADL either performed or assessed with clinical judgement   ADL Overall ADL's : Needs assistance/impaired Eating/Feeding: Independent;Sitting   Grooming: Set up;Sitting   Upper Body Bathing: Min guard;Sitting   Lower Body Bathing: Moderate assistance;Sit to/from stand   Upper Body Dressing : Min guard;Sitting   Lower Body Dressing: Maximal assistance;Sit to/from stand   Toilet Transfer: Minimal assistance;Ambulation;Rolling walker (2 wheels)   Toileting- Clothing Manipulation and Hygiene: Min guard;Sitting/lateral lean       Functional mobility during ADLs: Minimal assistance;Rolling walker (2 wheels) General ADL Comments: required increased time after all position  changes to recover from dizziness. Inreased assist for LB tasks to prevent bending positional changes. Verbal cues throughout to keep hea up and level     Vision  Baseline Vision/History: 1 Wears glasses Ability to See in Adequate Light: 0 Adequate Patient Visual Report: No change from baseline Additional Comments: nystagmus with any change in position     Perception     Praxis      Pertinent Vitals/Pain Pain Assessment: No/denies pain     Hand Dominance     Extremity/Trunk Assessment Upper Extremity Assessment Upper Extremity Assessment: Overall WFL for tasks assessed   Lower Extremity Assessment Lower Extremity Assessment: Defer to PT evaluation   Cervical / Trunk Assessment Cervical / Trunk Assessment: Normal   Communication Communication Communication: HOH   Cognition Arousal/Alertness: Awake/alert Behavior During Therapy: WFL for tasks assessed/performed Overall Cognitive Status: Within Functional Limits for tasks assessed                                       General Comments  VSS on RA, wife present and supportive    Exercises Exercises: Other exercises Other Exercises Other Exercises: encouraged theraband exercises at bed level   Shoulder Instructions      Home Living Family/patient expects to be discharged to:: Private residence Living Arrangements: Spouse/significant other Available Help at Discharge: Family Type of Home: House Home Access: Stairs to enter Technical brewer of Steps: 3 Entrance Stairs-Rails: Can reach both Home Layout: Multi-level;Able to live on main level with bedroom/bathroom               Home Equipment: None          Prior Functioning/Environment Prior Level of Function : Independent/Modified Independent;Driving             Mobility Comments: works out, walks dogs, golfs ADLs Comments: indep        OT Problem List: Decreased strength;Decreased range of motion;Decreased activity tolerance;Impaired balance (sitting and/or standing);Impaired vision/perception;Decreased knowledge of precautions      OT Treatment/Interventions: Self-care/ADL  training;Therapeutic exercise;DME and/or AE instruction;Therapeutic activities;Patient/family education;Balance training    OT Goals(Current goals can be found in the care plan section) Acute Rehab OT Goals Patient Stated Goal: home soon OT Goal Formulation: With patient Time For Goal Achievement: 09/13/21 Potential to Achieve Goals: Fair ADL Goals Pt Will Perform Grooming: with modified independence;standing Pt Will Perform Lower Body Bathing: with modified independence;sit to/from stand Pt Will Perform Lower Body Dressing: with modified independence;sit to/from stand Pt Will Transfer to Toilet: with modified independence;ambulating Pt/caregiver will Perform Home Exercise Program: Increased ROM;Increased strength;Both right and left upper extremity;With written HEP provided  OT Frequency: Min 2X/week   Barriers to D/C:            Co-evaluation              AM-PAC OT "6 Clicks" Daily Activity     Outcome Measure Help from another person eating meals?: None Help from another person taking care of personal grooming?: A Little Help from another person toileting, which includes using toliet, bedpan, or urinal?: A Little Help from another person bathing (including washing, rinsing, drying)?: A Lot Help from another person to put on and taking off regular upper body clothing?: A Little Help from another person to put on and taking off regular lower body clothing?: A Lot 6 Click Score: 17   End of Session Equipment Utilized  During Treatment: Gait belt;Rolling walker (2 wheels) Nurse Communication: Mobility status;Precautions;Weight bearing status  Activity Tolerance: Patient tolerated treatment well Patient left: with call bell/phone within reach;in bed;with family/visitor present  OT Visit Diagnosis: Unsteadiness on feet (R26.81);Other abnormalities of gait and mobility (R26.89);Muscle weakness (generalized) (M62.81);Dizziness and giddiness (R42)                Time:  1969-4098 OT Time Calculation (min): 21 min Charges:  OT General Charges $OT Visit: 1 Visit OT Evaluation $OT Eval Moderate Complexity: 1 Mod   Ithan Touhey A Klarissa Mcilvain 08/30/2021, 9:30 AM

## 2021-08-30 NOTE — Progress Notes (Signed)
ANTICOAGULATION CONSULT NOTE - Initial Consult  Pharmacy Consult for Coumadin Indication:  Afib and h/o recurrent/chronic VTE w/ factor V Leiden  Allergies  Allergen Reactions   Niacin And Related     Hepatitis   Phisohex [Hexachlorophene]     rash   Topamax [Topiramate]     suicidal   Vital Signs: Temp: 97.6 F (36.4 C) (12/26 0735) Temp Source: Oral (12/26 0735) BP: 142/92 (12/26 0735) Pulse Rate: 66 (12/26 0735)  Labs: Recent Labs    08/27/21 2005 08/28/21 2342 08/30/21 0237  HGB 14.3 14.2 14.9  HCT 43.5 44.5 45.5  PLT 209 185 195  APTT 36  --   --   LABPROT 25.4* 30.8* 38.5*  INR 2.3* 3.0* 3.9*  CREATININE 1.19 1.21 1.17     Medical History: Past Medical History:  Diagnosis Date   Ankle impingement syndrome    Right ankle from bone spur   Antiphospholipid antibody syndrome (HCC) 06/12/2012   Coagulopathy ([place]) 06/12/2012   DVT (deep venous thrombosis) ([place]) 1990, 2002   chronic hypercoagulobility.    DVT, recurrent, lower extremity, chronic 06/12/2012   1992   Dysrhythmia    AF   GERD (gastroesophageal reflux disease)    H/O tinnitus    Heterozygous factor V Leiden mutation ([place]) 06/12/2012   Hyperlipidemia    Migraine headache    Protein S deficiency ([place]) 06/12/2012   Prothrombin gene mutation ([place]) 06/12/2012   Pulmonary embolism ([place])    Pulmonary embolism, bilateral ([place]) 06/12/2012   July, 2002   PVC (premature ventricular contraction)    Varicose veins 06/12/2012   RLE   Wears hearing aid     Assessment: 70yo male c/o sudden loss of hearing in L ear and severe vertigo, to continue Coumadin for Afib and h/o VTE/factor V Leiden; current INR at goal with last dose taken 12/23. PTA regimen: warfarin 5 mg daily   INR supratherapeutic at 3.9 after giving a reduced dose yesterday. CBC stable. No s/sx of bleeding noted.  Goal of Therapy:  INR 2-3   Plan:  Hold warfarin today Check INR with AM labs tomorrow Hold warfarin until INR is back to goal  of 2-3  Joseph Art, Pharm.D. PGY-1 Pharmacy Resident [phone] 08/30/2021 8:32 AM

## 2021-08-30 NOTE — Progress Notes (Signed)
PROGRESS NOTE    Isamu Trammel  QIW:[id number] DOB: [birth year] DOA: 08/27/2021 PCP: Lavone Orn, MD   Brief Narrative:  70 year old with history of atrial fibrillation, factor V Leiden deficiency, HTN, abdominal aortic aneurysm, GERD, sensorineural hearing deafness in the right ear and complete hearing loss in the left presents with comes to the hospital complains of significant dizziness.  Patient states he went to [facility] ENT surgeon day prior to admission had had steroid injection and intratympanic left ear by Dr. Thornell Mule.  Since then he has had significant amount of dizziness, nausea and vomiting especially with any head movement.  He has been started on meclizine and scopolamine patch.  MRI of the brain was negative.  Also spoke with Dr. Barnetta Chapel as this type of dizziness is expected after steroid injection but typically for 30 minutes or so but if lasting any longer, trial of Valium can be helpful.   Assessment & Plan:   Principal Problem:   Vertigo Active Problems:   Aneurysm of abdominal vessel   Blood clotting disorder (HCC)   Heterozygous factor V Leiden mutation (HCC)   Chronic anticoagulation   PAF (paroxysmal atrial fibrillation) (HCC)   Vertigo with sensorineural hearing loss in both ears-follows outpatient with Dr. Elwyn Reach from ENT.  Recently had steroid injection and the stable symptoms are expected.  MRI of the brain is negative.  Continue prednisone taper.  Meclizine, scopolamine patch has been added.  Dr. Barnetta Chapel was curb sided from ENT who suggested trial of Valium.  Continue Valium today.  He still flow low dizzy, worse than yesterday.  We will increase his Valium to 10 mg, added remind 50 mg.  We will also speak with neurology to see if they have any other input. Ongoing discussion outpatient regarding cochlear implant.   Atrial fibrillation and factor V Leiden mutation-continue Coumadin.  Pharmacy to dose   Essential hypertension-continue home medicine.  IV  meds as needed   Abdominal aortic aneurysm status postrepair-follow-up outpatient   Hyperlipidemia-statin      DVT prophylaxis: On Coumadin Code Status: Full code Family Communication: Significant other at bedside  Still having severe dizziness with getting up. Feels unsafe.      Subjective: Patient states he felt better yesterday but around 2 AM this morning he started feeling very dizzy again, he thought could be related to him sleeping on one side.  Unsteady especially when he gets up.  Examination:  Constitutional: Not in acute distress, hard of hearing Respiratory: Clear to auscultation bilaterally Cardiovascular: Normal sinus rhythm, no rubs Abdomen: Nontender nondistended good bowel sounds Musculoskeletal: No edema noted Skin: No rashes seen Neurologic: CN 2-12 grossly intact.  And nonfocal Psychiatric: Normal judgment and insight. Alert and oriented x 3. Normal mood.  Objective: Vitals:   08/29/21 1924 08/30/21 0000 08/30/21 0304 08/30/21 0735  BP: (!) 141/90 (!) 138/91 127/72 (!) 142/92  Pulse: 65 (!) 59 (!) 57 66  Resp: 18 17 20 16   Temp: 98.4 F (36.9 C) 98 F (36.7 C) 98.1 F (36.7 C) 97.6 F (36.4 C)  TempSrc: Oral Oral Oral Oral  SpO2: 90% 93% 91% 93%  Weight:      Height:        Intake/Output Summary (Last 24 hours) at 08/30/2021 1210 Last data filed at 08/30/2021 1141 Gross per 24 hour  Intake 120 ml  Output 350 ml  Net -230 ml   Filed Weights   08/28/21 1541  Weight: 99.3 kg     Data Reviewed:  CBC: Recent Labs  Lab 08/27/21 2005 08/28/21 2342 08/30/21 0237  WBC 9.6 11.7* 12.0*  NEUTROABS 8.5*  --   --   HGB 14.3 14.2 14.9  HCT 43.5 44.5 45.5  MCV 87.7 87.1 86.3  PLT 209 185 412   Basic Metabolic Panel: Recent Labs  Lab 08/27/21 2005 08/28/21 2342 08/30/21 0237  NA 136 138 140  K 3.8 4.1 3.8  CL 105 105 104  CO2 21* 25 25  GLUCOSE 171* 127* 124*  BUN 23 20 23   CREATININE 1.19 1.21 1.17  CALCIUM 8.7* 8.9 8.8*   MG  --  2.3 2.3   GFR: Estimated Creatinine Clearance: 72.2 mL/min (by C-G formula based on SCr of 1.17 mg/dL). Liver Function Tests: No results for input(s): AST, ALT, ALKPHOS, BILITOT, PROT, ALBUMIN in the last 168 hours. No results for input(s): LIPASE, AMYLASE in the last 168 hours. No results for input(s): AMMONIA in the last 168 hours. Coagulation Profile: Recent Labs  Lab 08/27/21 2005 08/28/21 2342 08/30/21 0237  INR 2.3* 3.0* 3.9*   Cardiac Enzymes: No results for input(s): CKTOTAL, CKMB, CKMBINDEX, TROPONINI in the last 168 hours. BNP (last 3 results) No results for input(s): PROBNP in the last 8760 hours. HbA1C: No results for input(s): HGBA1C in the last 72 hours. CBG: No results for input(s): GLUCAP in the last 168 hours. Lipid Profile: No results for input(s): CHOL, HDL, LDLCALC, TRIG, CHOLHDL, LDLDIRECT in the last 72 hours. Thyroid Function Tests: No results for input(s): TSH, T4TOTAL, FREET4, T3FREE, THYROIDAB in the last 72 hours. Anemia Panel: No results for input(s): VITAMINB12, FOLATE, FERRITIN, TIBC, IRON, RETICCTPCT in the last 72 hours. Sepsis Labs: No results for input(s): PROCALCITON, LATICACIDVEN in the last 168 hours.  Recent Results (from the past 240 hour(s))  Resp Panel by RT-PCR (Flu A&B, Covid) Nasopharyngeal Swab     Status: None   Collection Time: 08/28/21  3:56 AM   Specimen: Nasopharyngeal Swab; Nasopharyngeal(NP) swabs in vial transport medium  Result Value Ref Range Status   SARS Coronavirus 2 by RT PCR NEGATIVE NEGATIVE Final    Comment: (NOTE) SARS-CoV-2 target nucleic acids are NOT DETECTED.  The SARS-CoV-2 RNA is generally detectable in upper respiratory specimens during the acute phase of infection. The lowest concentration of SARS-CoV-2 viral copies this assay can detect is 138 copies/mL. A negative result does not preclude SARS-Cov-2 infection and should not be used as the sole basis for treatment or other patient  management decisions. A negative result may occur with  improper specimen collection/handling, submission of specimen other than nasopharyngeal swab, presence of viral mutation(s) within the areas targeted by this assay, and inadequate number of viral copies(<138 copies/mL). A negative result must be combined with clinical observations, patient history, and epidemiological information. The expected result is Negative.  Fact Sheet for Patients:  EntrepreneurPulse.com.au  Fact Sheet for Healthcare Providers:  IncredibleEmployment.be  This test is no t yet approved or cleared by the [place] FDA and  has been authorized for detection and/or diagnosis of SARS-CoV-2 by FDA under an Emergency Use Authorization (EUA). This EUA will remain  in effect (meaning this test can be used) for the duration of the COVID-19 declaration under Section 564(b)(1) of the Act, 21 U.S.C.section 360bbb-3(b)(1), unless the authorization is terminated  or revoked sooner.       Influenza A by PCR NEGATIVE NEGATIVE Final   Influenza B by PCR NEGATIVE NEGATIVE Final    Comment: (NOTE) The Xpert Xpress SARS-CoV-2/FLU/RSV plus assay  is intended as an aid in the diagnosis of influenza from Nasopharyngeal swab specimens and should not be used as a sole basis for treatment. Nasal washings and aspirates are unacceptable for Xpert Xpress SARS-CoV-2/FLU/RSV testing.  Fact Sheet for Patients: EntrepreneurPulse.com.au  Fact Sheet for Healthcare Providers: IncredibleEmployment.be  This test is not yet approved or cleared by the [place] FDA and has been authorized for detection and/or diagnosis of SARS-CoV-2 by FDA under an Emergency Use Authorization (EUA). This EUA will remain in effect (meaning this test can be used) for the duration of the COVID-19 declaration under Section 564(b)(1) of the Act, 21 U.S.C. section 360bbb-3(b)(1),  unless the authorization is terminated or revoked.  Performed at [facility], [place] [address] [place], [place] [address]          Radiology Studies: No results found.      Scheduled Meds:  calcium carbonate  1 tablet Oral TID WC   diazepam  10 mg Oral Q8H   ezetimibe  10 mg Oral Daily   gabapentin  300 mg Oral QHS   gabapentin  600 mg Oral Daily   meclizine  25 mg Oral TID   pravastatin  80 mg Oral Daily   predniSONE  20 mg Oral TID   ramipril  10 mg Oral Daily   scopolamine  1 patch Transdermal Q72H   Warfarin - Pharmacist Dosing Inpatient   Does not apply q1600   Continuous Infusions:   LOS: 0 days   Time spent= 35 mins    Luis Sami Arsenio Loader, MD [facility]  If 7PM-7AM, please contact night-coverage  08/30/2021, 12:10 PM

## 2021-08-30 NOTE — Progress Notes (Signed)
Physical Therapy Treatment Patient Details Name: Don Ruiz MRN: [id number] DOB: [birth year] Today's Date: 08/30/2021   History of Present Illness 70 y.o. male presents to [facility] hospital on 08/27/2021 with hearing loss in left ear, nausea, vomiting, dizziness. MRI negative for acute findings. PMH includes afb, HTN, AAA, GERD, R ear sensorineural hearing loss.    PT Comments    Patient reports feeling 60% better than when seen 08/28/21. He continues to have horizontal nystagmus, worse when looking left than right (indicative of left vestibular neuritis). Do not feel BPPV is an issue at this time. Patient provided education on habituation exercise and target fixation with mobility. No nausea elicited and able to slowly walk 50 ft with RW.    Recommendations for follow up therapy are one component of a multi-disciplinary discharge planning process, led by the attending physician.  Recommendations may be updated based on patient status, additional functional criteria and insurance authorization.  Follow Up Recommendations  Outpatient PT (vestibular PT)     Assistance Recommended at Discharge Frequent or constant Supervision/Assistance  Equipment Recommendations  Rolling walker (2 wheels)    Recommendations for Other Services       Precautions / Restrictions Precautions Precautions: Fall Precaution Comments: left beating nystagmus with movement Restrictions Weight Bearing Restrictions: No     Mobility  Bed Mobility Overal bed mobility: Needs Assistance Bed Mobility: Sit to Supine;Supine to Sit Rolling: Min guard Sidelying to sit: Min guard Supine to sit: Supervision Sit to supine: Supervision   General bed mobility comments: increased time, no physical assist. vc to fix eyes on stationary target    Transfers Overall transfer level: Needs assistance Equipment used: Rolling walker (2 wheels) Transfers: Sit to/from Stand Sit to Stand: Min assist           General  transfer comment: for balance due to posterior lean; vc for safe use of RW    Ambulation/Gait Ambulation/Gait assistance: Min assist Gait Distance (Feet): 50 Feet Assistive device: Rolling walker (2 wheels) Gait Pattern/deviations: Step-to pattern;Step-through pattern;Decreased stride length   Gait velocity interpretation: 1.31 - 2.62 ft/sec, indicative of limited community ambulator   General Gait Details: educated to fix eyes on target as walking toward it, looked down to floor when turning   Chief Strategy Officer    Modified Rankin (Stroke Patients Only)       Balance Overall balance assessment: Needs assistance Sitting-balance support: Feet supported Sitting balance-Leahy Scale: Fair     Standing balance support: Bilateral upper extremity supported;Reliant on assistive device for balance Standing balance-Leahy Scale: Poor Standing balance comment: verbal cues for position and posture                            Cognition Arousal/Alertness: Awake/alert Behavior During Therapy: WFL for tasks assessed/performed Overall Cognitive Status: Within Functional Limits for tasks assessed                                          Exercises Other Exercises Other Exercises: horizontal and vertical head turns with focus on target at endrange (until target stops moving and then turn head other direction) x 5 reps each way.    General Comments General comments (skin integrity, edema, etc.): using dry erase board for most communication as pt with difficulty hearing  Pertinent Vitals/Pain Pain Assessment: No/denies pain    Home Living Family/patient expects to be discharged to:: Private residence Living Arrangements: Spouse/significant other Available Help at Discharge: Family Type of Home: House Home Access: Stairs to enter Entrance Stairs-Rails: Can reach both Entrance Stairs-Number of Steps: 3   Home Layout:  Multi-level;Able to live on main level with bedroom/bathroom Home Equipment: None      Prior Function            PT Goals (current goals can now be found in the care plan section) Acute Rehab PT Goals Patient Stated Goal: to relieve dizziness and return to independence PT Goal Formulation: With patient Time For Goal Achievement: 09/11/21 Potential to Achieve Goals: Good Progress towards PT goals: Progressing toward goals    Frequency    Min 3X/week      PT Plan Current plan remains appropriate    Co-evaluation              AM-PAC PT "6 Clicks" Mobility   Outcome Measure  Help needed turning from your back to your side while in a flat bed without using bedrails?: A Little Help needed moving from lying on your back to sitting on the side of a flat bed without using bedrails?: A Little Help needed moving to and from a bed to a chair (including a wheelchair)?: A Little Help needed standing up from a chair using your arms (e.g., wheelchair or bedside chair)?: A Little Help needed to walk in hospital room?: A Little Help needed climbing 3-5 steps with a railing? : A Little 6 Click Score: 18    End of Session Equipment Utilized During Treatment: Gait belt Activity Tolerance: Patient tolerated treatment well (reports feeling 60% better) Patient left: in bed;with call bell/phone within reach Nurse Communication: Mobility status PT Visit Diagnosis: Dizziness and giddiness (R42)     Time: 5830-9407 PT Time Calculation (min) (ACUTE ONLY): 32 min  Charges:  $Gait Training: 8-22 mins $Neuromuscular Re-education: 8-22 mins                      Arby Barrette, [place]  Pager [phone] Office [phone]    Rexanne Mano 08/30/2021, 11:53 AM

## 2021-08-30 NOTE — TOC Initial Note (Signed)
Transition of Care [facility]) - Initial/Assessment Note    Patient Details  Name: Don Ruiz MRN: [id number] Date of Birth: July 13, 1951  Transition of Care [facility]) CM/SW Contact:    Cyndi Bender, RN Phone Number: 08/30/2021, 4:06 PM  Clinical Narrative:                 Spoke to patient regarding transition needs. Physical therapy recommends outpt PT. Referral sent to [facility] outpt PT. Patient agreeable to use in house provider for RW. Spoke to [place] with [employer] and ordered RW. Patient doesn't want 3&1. TOC will continue to follow for needs.   Expected Discharge Plan: Home/Self Care Barriers to Discharge: Continued Medical Work up   Patient Goals and CMS Choice Patient states their goals for this hospitalization and ongoing recovery are:: return home      Expected Discharge Plan and Services Expected Discharge Plan: Home/Self Care   Discharge Planning Services: CM Consult   Living arrangements for the past 2 months: Single Family Home                 DME Arranged: Walker rolling DME Agency: [employer] Date DME Agency Contacted: 08/30/21 Time DME Agency Contacted: [phone] Representative spoke with at DME Agency: Mardene Celeste            Prior Living Arrangements/Services Living arrangements for the past 2 months: [place] Lives with:: Spouse Patient language and need for interpreter reviewed:: Yes Do you feel safe going back to the place where you live?: Yes      Need for Family Participation in Patient Care: Yes (Comment) Care giver support system in place?: Yes (comment)   Criminal Activity/Legal Involvement Pertinent to Current Situation/Hospitalization: No - Comment as needed  Activities of Daily Living Home Assistive Devices/Equipment: None ADL Screening (condition at time of admission) Patient's cognitive ability adequate to safely complete daily activities?: Yes Is the patient deaf or have difficulty hearing?: Yes Does the patient have difficulty  seeing, even when wearing glasses/contacts?: No Does the patient have difficulty concentrating, remembering, or making decisions?: No Patient able to express need for assistance with ADLs?: No Does the patient have difficulty dressing or bathing?: No Independently performs ADLs?: No Communication: Independent Dressing (OT): Independent Grooming: Independent Feeding: Independent Bathing: Independent Toileting: Independent In/Out Bed: Independent Walks in Home: Independent Does the patient have difficulty walking or climbing stairs?: No Weakness of Legs: None Weakness of Arms/Hands: None  Permission Sought/Granted Permission sought to share information with : Facility Marketing executive granted to share info w Contact Information: outpt PT  Emotional Assessment Appearance:: Appears stated age Attitude/Demeanor/Rapport: Engaged Affect (typically observed): Accepting Orientation: : Oriented to Self, Oriented to Place, Oriented to  Time, Oriented to Situation Alcohol / Substance Use: Not Applicable Psych Involvement: No (comment)  Admission diagnosis:  Vertigo [R42] Patient Active Problem List   Diagnosis Date Noted   Vertigo 08/28/2021   PAF (paroxysmal atrial fibrillation) ([place]) 08/28/2021   Hematoma of left thigh 02/17/2021   Elevated LFTs    Acute epigastric pain 02/04/2017   HTN (hypertension) 09/27/2013   SNHL (sensorineural hearing loss) 05/24/2013   CAD (coronary artery disease) 04/18/2013   Chronic anticoagulation 04/18/2013   GERD (gastroesophageal reflux disease) 04/18/2013   History of pulmonary embolism 04/18/2013   Blood clotting disorder ([place]) 06/12/2012   Heterozygous factor V Leiden mutation ([place]) 06/12/2012   Prothrombin gene mutation ([place]) 06/12/2012   Antiphospholipid antibody syndrome (  [place]) 06/12/2012   Protein S deficiency ([place]) 06/12/2012   DVT, recurrent, lower extremity, chronic 06/12/2012   Pulmonary embolism,  bilateral ([place]) 06/12/2012   Varicose veins 06/12/2012   Chronic venous embolism and thrombosis of deep vessels of lower extremity ([place]) 06/12/2012   Primary hypercoagulable state ([place]) 06/12/2012   Aneurysm of abdominal vessel 03/12/2012   Dissection of aorta, abdominal ([place]) 03/12/2012   ABNORMALITY OF GAIT 07/02/2009   SPRAIN&STRAIN OTHER SPECIFIED SITES KNEE&LEG 07/02/2009   PCP:  Lavone Orn, MD Pharmacy:   [facility] [address] [place] [address] [place] [address] Phone: [phone] Fax: [phone]     Social Determinants of Health (SDOH) Interventions    Readmission Risk Interventions No flowsheet data found.

## 2021-08-31 DIAGNOSIS — R42 Dizziness and giddiness: Secondary | ICD-10-CM | POA: Diagnosis not present

## 2021-08-31 LAB — CBC
HCT: 46.6 % (ref 39.0–52.0)
Hemoglobin: 15 g/dL (ref 13.0–17.0)
MCH: 28.2 pg (ref 26.0–34.0)
MCHC: 32.2 g/dL (ref 30.0–36.0)
MCV: 87.6 fL (ref 80.0–100.0)
Platelets: 255 10*3/uL (ref 150–400)
RBC: 5.32 MIL/uL (ref 4.22–5.81)
RDW: 14.2 % (ref 11.5–15.5)
WBC: 11.7 10*3/uL — ABNORMAL HIGH (ref 4.0–10.5)
nRBC: 0 % (ref 0.0–0.2)

## 2021-08-31 LAB — BASIC METABOLIC PANEL
Anion gap: 9 (ref 5–15)
BUN: 25 mg/dL — ABNORMAL HIGH (ref 8–23)
CO2: 24 mmol/L (ref 22–32)
Calcium: 9 mg/dL (ref 8.9–10.3)
Chloride: 105 mmol/L (ref 98–111)
Creatinine, Ser: 1.18 mg/dL (ref 0.61–1.24)
GFR, Estimated: >60 mL/min (ref >60)
Glucose, Bld: 122 mg/dL — ABNORMAL HIGH (ref 70–99)
Potassium: 4.3 mmol/L (ref 3.5–5.1)
Sodium: 138 mmol/L (ref 135–145)

## 2021-08-31 LAB — PROTIME-INR
INR: 3.3 — ABNORMAL HIGH (ref 0.8–1.2)
Prothrombin Time: 33.4 seconds — ABNORMAL HIGH (ref 11.4–15.2)

## 2021-08-31 LAB — MAGNESIUM: Magnesium: 2 mg/dL (ref 1.7–2.4)

## 2021-08-31 NOTE — Progress Notes (Signed)
ANTICOAGULATION CONSULT NOTE - Follow-up Consult  Pharmacy Consult for Coumadin Indication:  Afib and h/o recurrent/chronic VTE w/ factor V Leiden  Allergies  Allergen Reactions   Niacin And Related     Hepatitis   Phisohex [Hexachlorophene]     rash   Topamax [Topiramate]     suicidal   Vital Signs: Temp: 97.5 F (36.4 C) (12/27 0740) Temp Source: Oral (12/27 0740) BP: 148/96 (12/27 0740) Pulse Rate: 54 (12/27 0740)  Labs: Recent Labs    08/28/21 2342 08/30/21 0237 08/31/21 0157  HGB 14.2 14.9 15.0  HCT 44.5 45.5 46.6  PLT 185 195 255  LABPROT 30.8* 38.5* 33.4*  INR 3.0* 3.9* 3.3*  CREATININE 1.21 1.17 1.18     Medical History: Past Medical History:  Diagnosis Date   Ankle impingement syndrome    Right ankle from bone spur   Antiphospholipid antibody syndrome (HCC) 06/12/2012   Coagulopathy ([place]) 06/12/2012   DVT (deep venous thrombosis) ([place]) 1990, 2002   chronic hypercoagulobility.    DVT, recurrent, lower extremity, chronic 06/12/2012   1992   Dysrhythmia    AF   GERD (gastroesophageal reflux disease)    H/O tinnitus    Heterozygous factor V Leiden mutation ([place]) 06/12/2012   Hyperlipidemia    Migraine headache    Protein S deficiency ([place]) 06/12/2012   Prothrombin gene mutation ([place]) 06/12/2012   Pulmonary embolism ([place])    Pulmonary embolism, bilateral ([place]) 06/12/2012   July, 2002   PVC (premature ventricular contraction)    Varicose veins 06/12/2012   RLE   Wears hearing aid     Assessment: 70yo male c/o sudden loss of hearing in L ear and severe vertigo, to continue Coumadin for Afib and h/o VTE/factor V Leiden; Last dose taken 12/23. PTA regimen: warfarin 5 mg daily.   INR continues to be supratherapeutic at 3.3 today, though approaching goal. CBC stable. No signs of bleeding noted.    Goal of Therapy:  INR 2-3   Plan:  Hold warfarin today Check INR with AM labs tomorrow Hold warfarin until INR is back to goal of 2-3  Elita Quick,  PharmD PGY1 Pharmacy Resident 08/31/2021 10:58 AM

## 2021-08-31 NOTE — Consult Note (Signed)
Neurology Consult H&P  Don Ruiz MR# [id number] 08/31/2021  CC: vertigo  History is obtained from: patient, wife and chart.  HPI: Don Ruiz is a 70 y.o. male PMHx as reviewed below with vertigo which developed after intratympanic on the left ear dexamethasone injection for sudden sensorineural hearing loss.  The following information was taken from Dr. Joan Mayans note on 08/31/2021:  "...history of atrial fibrillation, factor V Leyden mutation, hypertension, abdominal aortic aneurysm repair, GERD who has had prior history of sensorineural deafness of the right ear presently wearing a hearing aid started experiencing complete loss of hearing on the left ear when he woke up from sleep yesterday morning.  Patient had followed up with [facility] ENT surgeon yesterday with Dr.Krauss and was evaluated.  Patient was given an injection dexamethasone and intratympanic on the left ear.  Advised to take prednisone p.o. for the next few days.  And follow-up with the ENT surgeon.  Patient prior to this has had been having some nausea vomiting and dizziness which has progressively got worse through the day.  Decided to come to the ER.  Denies any weakness of the upper or lower extremities.   ED Course: In the ER MRI of the brain does not show anything acute.  Despite giving meclizine diazepam patient still very vertiginous and difficult to ambulate admitted for further observation.  Labs show blood glucose of 171 CBC unremarkable INR is 2.3 COVID test negative."  ROS: A complete ROS was performed and is negative except as noted in the HPI.  Past Medical History:  Diagnosis Date   Ankle impingement syndrome    Right ankle from bone spur   Antiphospholipid antibody syndrome ([place]) 06/12/2012   Coagulopathy ([place]) 06/12/2012   DVT (deep venous thrombosis) ([place]) 1990, 2002   chronic hypercoagulobility.    DVT, recurrent, lower extremity, chronic 06/12/2012   1992   Dysrhythmia    AF   GERD  (gastroesophageal reflux disease)    H/O tinnitus    Heterozygous factor V Leiden mutation ([place]) 06/12/2012   Hyperlipidemia    Migraine headache    Protein S deficiency ([place]) 06/12/2012   Prothrombin gene mutation ([place]) 06/12/2012   Pulmonary embolism ([place])    Pulmonary embolism, bilateral ([place]) 06/12/2012   July, 2002   PVC (premature ventricular contraction)    Varicose veins 06/12/2012   RLE   Wears hearing aid    Family History  Problem Relation Age of Onset   Cancer Mother        Breast cancer   Hypertension Mother    Diabetes Mother    Fibromyalgia Mother    Stroke Mother    Stroke Father    Heart disease Father    Alcohol abuse Sister    Hyperlipidemia Sister    Hypertension Sister    Heart disease Brother        CABG at 85   Stroke Brother    Pulmonary embolism Brother    Heart disease Other    Hyperlipidemia Other    Heart disease Brother        MI at 95 CABG   Heart disease Brother        CABG,   MI at 44   Stroke Sister 12   Heart disease Sister    Cancer Sister        Breast Cancer   Other Sister        acoustic tumor   Deep vein thrombosis Sister    Social History:  reports that he has never smoked. He has never used smokeless tobacco. He reports that he does not drink alcohol and does not use drugs.  Prior to Admission medications   Medication Sig Start Date End Date Taking? Authorizing Provider  celecoxib (CELEBREX) 200 MG capsule Take 200 mg by mouth daily as needed. For pain   Yes [provider]  diphenhydrAMINE (BENADRYL) 25 MG tablet Take 25 mg by mouth as needed for sleep.   Yes [provider]  diphenhydramine-acetaminophen (TYLENOL PM) 25-500 MG TABS tablet Take 2 tablets by mouth at bedtime as needed (for sleep).   Yes [provider]  ezetimibe (ZETIA) 10 MG tablet Take 1 tablet by mouth daily. 01/27/17  Yes [provider]  gabapentin (NEURONTIN) 600 MG tablet Take 300-600 mg by mouth See admin  instructions. 600 in the morning and 300 at night   Yes [provider]  Multiple Vitamin (MULITIVITAMIN WITH MINERALS) TABS Take 1 tablet by mouth daily.   Yes [provider]  nitroGLYCERIN (NITROSTAT) 0.3 MG SL tablet Place 0.3 mg under the tongue every 5 (five) minutes as needed for chest pain.   Yes [provider]  pravastatin (PRAVACHOL) 80 MG tablet Take 80 mg by mouth daily.   Yes [provider]  psyllium (METAMUCIL) 58.6 % packet Take 1 packet by mouth daily.   Yes [provider]  ramipril (ALTACE) 10 MG capsule Take 10 mg by mouth daily. 06/12/21  Yes [provider]  warfarin (COUMADIN) 5 MG tablet Take 5 mg by mouth daily. 5 mg every day.   Yes [provider]  oxyCODONE-acetaminophen (PERCOCET/ROXICET) 5-325 MG tablet Take 1-2 tablets by mouth every 6 (six) hours as needed for severe pain. Patient not taking: Reported on 08/27/2021 02/17/21   Gary Fleet, PA-C   Exam: Current vital signs: BP (!) 148/96 (BP Location: Right Arm)    Pulse (!) 54    Temp (!) 97.5 F (36.4 C) (Oral)    Resp 17    Ht 6\' 3"  (1.905 m)    Wt 99.3 kg    SpO2 92%    BMI 27.36 kg/m   Physical Exam  Constitutional: Appears well-developed and well-nourished.  Psych: Affect appropriate to situation Eyes: No scleral injection HENT: No OP obstruction. Head: Normocephalic.  Cardiovascular: Normal rate and regular rhythm.  Respiratory: Effort normal, symmetric excursions bilaterally, no audible wheezing. GI: Soft.  No distension. There is no tenderness.  Skin: WDI  Neuro: Mental Status: Patient is awake, alert, oriented to person, place, month, year, and situation. Hearing significantly diminished. Patient is able to give a clear and coherent history. Speech fluent, intact comprehension and repetition. No signs of aphasia or neglect. Visual Fields are full. Pupils are equal, round, and reactive to light. EOMI without ptosis or diploplia.   Facial sensation is symmetric to temperature Facial movement is symmetric.  Hearing is intact to voice. Uvula midline and palate elevates symmetrically. Shoulder shrug is symmetric. Tongue is midline without atrophy or fasciculations.  Tone is normal. Bulk is normal. 5/5 strength was present in all four extremities. Sensation is symmetric to light touch and temperature in the arms and legs. Gait - Deferred  I have reviewed labs in epic and the pertinent results are: No pertinent labs  I have reviewed the images obtained: MRI brain showed No acute intracranial abnormality. Findings of mild chronic microvascular ischemia and generalized volume loss.  Assessment: Don Ruiz is a 70 y.o. male PMHx as above  with  sudden sensorineural hearing loss in the left ear s/p left intratympanic dexamethasone injection with subsequent peripheral vertigo.   Plan:  -Contact Dr. Nelle Don neurotology [phone].  -Continue diazepam unchanged - It may take 1-2 weeks before effect and treatment may be ongoing.  -Try lamotrigine 100mg  daily for dizziness and may increase as needed: Weeks 1-2: 25 mg/d; Weeks 3-4: 50 mg/d; Week 5: 100 mg/d.  -May discontinue meclizine as they feel it has not been helpful.  -Try removing scopolamine patch to see if it was providing benefit - He was experiencing difficulty urinating (anticholinergic effects).  -Follow up with ENT to discuss oral dexamethasone instead of intratympanic injection.  -Refer to vestibular rehabilitation.  Please call for questions.  Electronically signed by:  Lynnae Sandhoff, MD Page: [phone] 08/31/2021, 10:45 AM

## 2021-08-31 NOTE — Progress Notes (Signed)
PROGRESS NOTE    Jeremih Dearmas  YHC:[id number] DOB: 15-Oct-1950 DOA: 08/27/2021 PCP: Lavone Orn, MD   Brief Narrative:  70 year old with history of atrial fibrillation, factor V Leiden deficiency, HTN, abdominal aortic aneurysm, GERD, sensorineural hearing deafness in the right ear and complete hearing loss in the left presents with comes to the hospital complains of significant dizziness.  Patient states he went to [facility] ENT surgeon day prior to admission had had steroid injection and intratympanic left ear by Dr. Thornell Mule.  Since then he has had significant amount of dizziness, nausea and vomiting especially with any head movement.  He has been started on meclizine and scopolamine patch.  MRI of the brain was negative.  Also spoke with Dr. Barnetta Chapel as this type of dizziness is expected after steroid injection but typically for 30 minutes or so but if lasting any longer, trial of Valium can be helpful. Neurology consulted.    Assessment & Plan:   Principal Problem:   Vertigo Active Problems:   Aneurysm of abdominal vessel   Blood clotting disorder (HCC)   Heterozygous factor V Leiden mutation (HCC)   Chronic anticoagulation   PAF (paroxysmal atrial fibrillation) (HCC)   Vertigo with sensorineural hearing loss in both ears-Persistent dizziness. follows outpatient with Dr. Elwyn Reach from ENT.  Recently had steroid injection and the stable symptoms are expected.  MRI of the brain is negative.  Continue prednisone taper.  Meclizine, scopolamine patch has been added.  Dr. Barnetta Chapel was curb sided from ENT who suggested trial of Valium.  Continue Valium today.  He still flow low dizzy, worse than yesterday.  Valium at 10mg  po tid.  Will consult Neurology for their input today.  Ongoing discussion outpatient regarding cochlear implant.   Atrial fibrillation and factor V Leiden mutation-continue Coumadin.  Pharmacy to dose   Essential hypertension-continue home medicine.  IV meds as needed    Abdominal aortic aneurysm status postrepair-follow-up outpatient   Hyperlipidemia- statin  DVT prophylaxis: On Coumadin Code Status: Full code Family Communication: Significant other at bedside  Still very dizzy, neurology consulted for their input  Subjective: Still feeling dizzy even with any movement.   Examination: Constitutional: Mild distress due to dizziness. Sensineural hearing loss.  Respiratory: Clear to auscultation bilaterally Cardiovascular: Normal sinus rhythm, no rubs Abdomen: Nontender nondistended good bowel sounds Musculoskeletal: No edema noted Skin: No rashes seen Neurologic: CN 2-12 grossly intact.  And nonfocal Psychiatric: Normal judgment and insight. Alert and oriented x 3. Normal mood.   Objective: Vitals:   08/30/21 2000 08/30/21 2351 08/31/21 0524 08/31/21 0740  BP: (!) 145/86 (!) 146/93 (!) 134/92 (!) 148/96  Pulse:  60 60 (!) 54  Resp: (!) 24 16 16 17   Temp: 98.3 F (36.8 C) (!) 97.5 F (36.4 C) 97.7 F (36.5 C) (!) 97.5 F (36.4 C)  TempSrc: Oral Oral Oral Oral  SpO2: 94% 93% 92% 92%  Weight:      Height:        Intake/Output Summary (Last 24 hours) at 08/31/2021 1150 Last data filed at 08/31/2021 0525 Gross per 24 hour  Intake 358 ml  Output 800 ml  Net -442 ml   Filed Weights   08/28/21 1541  Weight: 99.3 kg     Data Reviewed:   CBC: Recent Labs  Lab 08/27/21 2005 08/28/21 2342 08/30/21 0237 08/31/21 0157  WBC 9.6 11.7* 12.0* 11.7*  NEUTROABS 8.5*  --   --   --   HGB 14.3 14.2 14.9 15.0  HCT  43.5 44.5 45.5 46.6  MCV 87.7 87.1 86.3 87.6  PLT 209 185 195 935   Basic Metabolic Panel: Recent Labs  Lab 08/27/21 2005 08/28/21 2342 08/30/21 0237 08/31/21 0157  NA 136 138 140 138  K 3.8 4.1 3.8 4.3  CL 105 105 104 105  CO2 21* 25 25 24   GLUCOSE 171* 127* 124* 122*  BUN 23 20 23  25*  CREATININE 1.19 1.21 1.17 1.18  CALCIUM 8.7* 8.9 8.8* 9.0  MG  --  2.3 2.3 2.0   GFR: Estimated Creatinine Clearance: 71.6  mL/min (by C-G formula based on SCr of 1.18 mg/dL). Liver Function Tests: No results for input(s): AST, ALT, ALKPHOS, BILITOT, PROT, ALBUMIN in the last 168 hours. No results for input(s): LIPASE, AMYLASE in the last 168 hours. No results for input(s): AMMONIA in the last 168 hours. Coagulation Profile: Recent Labs  Lab 08/27/21 2005 08/28/21 2342 08/30/21 0237 08/31/21 0157  INR 2.3* 3.0* 3.9* 3.3*   Cardiac Enzymes: No results for input(s): CKTOTAL, CKMB, CKMBINDEX, TROPONINI in the last 168 hours. BNP (last 3 results) No results for input(s): PROBNP in the last 8760 hours. HbA1C: No results for input(s): HGBA1C in the last 72 hours. CBG: No results for input(s): GLUCAP in the last 168 hours. Lipid Profile: No results for input(s): CHOL, HDL, LDLCALC, TRIG, CHOLHDL, LDLDIRECT in the last 72 hours. Thyroid Function Tests: No results for input(s): TSH, T4TOTAL, FREET4, T3FREE, THYROIDAB in the last 72 hours. Anemia Panel: No results for input(s): VITAMINB12, FOLATE, FERRITIN, TIBC, IRON, RETICCTPCT in the last 72 hours. Sepsis Labs: No results for input(s): PROCALCITON, LATICACIDVEN in the last 168 hours.  Recent Results (from the past 240 hour(s))  Resp Panel by RT-PCR (Flu A&B, Covid) Nasopharyngeal Swab     Status: None   Collection Time: 08/28/21  3:56 AM   Specimen: Nasopharyngeal Swab; Nasopharyngeal(NP) swabs in vial transport medium  Result Value Ref Range Status   SARS Coronavirus 2 by RT PCR NEGATIVE NEGATIVE Final    Comment: (NOTE) SARS-CoV-2 target nucleic acids are NOT DETECTED.  The SARS-CoV-2 RNA is generally detectable in upper respiratory specimens during the acute phase of infection. The lowest concentration of SARS-CoV-2 viral copies this assay can detect is 138 copies/mL. A negative result does not preclude SARS-Cov-2 infection and should not be used as the sole basis for treatment or other patient management decisions. A negative result may occur  with  improper specimen collection/handling, submission of specimen other than nasopharyngeal swab, presence of viral mutation(s) within the areas targeted by this assay, and inadequate number of viral copies(<138 copies/mL). A negative result must be combined with clinical observations, patient history, and epidemiological information. The expected result is Negative.  Fact Sheet for Patients:  EntrepreneurPulse.com.au  Fact Sheet for Healthcare Providers:  IncredibleEmployment.be  This test is no t yet approved or cleared by the [place] FDA and  has been authorized for detection and/or diagnosis of SARS-CoV-2 by FDA under an Emergency Use Authorization (EUA). This EUA will remain  in effect (meaning this test can be used) for the duration of the COVID-19 declaration under Section 564(b)(1) of the Act, 21 U.S.C.section 360bbb-3(b)(1), unless the authorization is terminated  or revoked sooner.       Influenza A by PCR NEGATIVE NEGATIVE Final   Influenza B by PCR NEGATIVE NEGATIVE Final    Comment: (NOTE) The Xpert Xpress SARS-CoV-2/FLU/RSV plus assay is intended as an aid in the diagnosis of influenza from Nasopharyngeal swab specimens and  should not be used as a sole basis for treatment. Nasal washings and aspirates are unacceptable for Xpert Xpress SARS-CoV-2/FLU/RSV testing.  Fact Sheet for Patients: EntrepreneurPulse.com.au  Fact Sheet for Healthcare Providers: IncredibleEmployment.be  This test is not yet approved or cleared by the [place] FDA and has been authorized for detection and/or diagnosis of SARS-CoV-2 by FDA under an Emergency Use Authorization (EUA). This EUA will remain in effect (meaning this test can be used) for the duration of the COVID-19 declaration under Section 564(b)(1) of the Act, 21 U.S.C. section 360bbb-3(b)(1), unless the authorization is terminated  or revoked.  Performed at [facility], [place] [address] [place], [place] [address]          Radiology Studies: No results found.      Scheduled Meds:  calcium carbonate  1 tablet Oral TID WC   diazepam  10 mg Oral Q8H   ezetimibe  10 mg Oral Daily   gabapentin  300 mg Oral QHS   gabapentin  600 mg Oral Daily   meclizine  25 mg Oral TID   pravastatin  80 mg Oral Daily   predniSONE  20 mg Oral TID   ramipril  10 mg Oral Daily   scopolamine  1 patch Transdermal Q72H   Warfarin - Pharmacist Dosing Inpatient   Does not apply q1600   Continuous Infusions:   LOS: 1 day   Time spent= 35 mins    Merik Mignano Arsenio Loader, MD [facility]  If 7PM-7AM, please contact night-coverage  08/31/2021, 11:50 AM

## 2021-08-31 NOTE — Progress Notes (Signed)
Physical Therapy Treatment Patient Details Name: Don Ruiz MRN: [id number] DOB: November 03, 1950 Today's Date: 08/31/2021   History of Present Illness 70 y.o. male presents to [facility] hospital on 08/27/2021 with hearing loss in left ear, nausea, vomiting, dizziness. MRI negative for acute findings. PMH includes afb, HTN, AAA, GERD, R ear sensorineural hearing loss.    PT Comments    Patient has had a set-back compared to yesterday. He is also now experiencing decr coordination in LLE and bil UEs (knocking things over when reaching for them, difficulty using fork).  Patient and wife report noting change in coordination beginning last night. He required incr assist with bed mobility, transfer and ambulation compared to 12/26 due to decreased balance and coordination. Neurologist in during session and completing his assessment with above information shared with him.   *Due to patient's slower than anticipated recovery, and worsening today, updated discharge recommendation to AIR.    Recommendations for follow up therapy are one component of a multi-disciplinary discharge planning process, led by the attending physician.  Recommendations may be updated based on patient status, additional functional criteria and insurance authorization.  Follow Up Recommendations  Acute inpatient rehab (3hours/day) (vestibular PT)     Assistance Recommended at Discharge Frequent or constant Supervision/Assistance  Equipment Recommendations  Rolling walker (2 wheels)    Recommendations for Other Services Rehab consult;OT consult     Precautions / Restrictions Precautions Precautions: Fall Precaution Comments: nystagmus now imperceptible     Mobility  Bed Mobility Overal bed mobility: Needs Assistance Bed Mobility: Sit to Supine;Supine to Sit   Sidelying to sit: Min assist   Sit to supine: Supervision   General bed mobility comments: increased time, +LOB to his left required min assist to recover     Transfers Overall transfer level: Needs assistance Equipment used: Rolling walker (2 wheels) Transfers: Sit to/from Stand Sit to Stand: Mod assist           General transfer comment: for balance due to posterior lean; vc for safe use of RW    Ambulation/Gait Ambulation/Gait assistance: Min assist Gait Distance (Feet): 50 Feet Assistive device: Rolling walker (2 wheels) Gait Pattern/deviations: Step-to pattern;Step-through pattern;Decreased stride length;Decreased dorsiflexion - left       General Gait Details: pt reports decr ability to make his feet go where he wants; tried fixing eyes on doorknob vs looking at crossbar of RW and pt stated less dizzy using crossbar. More unsteady than 12/26   Stairs             Wheelchair Mobility    Modified Rankin (Stroke Patients Only)       Balance Overall balance assessment: Needs assistance Sitting-balance support: Feet supported Sitting balance-Leahy Scale: Fair     Standing balance support: Bilateral upper extremity supported;Reliant on assistive device for balance Standing balance-Leahy Scale: Poor Standing balance comment: verbal cues for position and posture                            Cognition Arousal/Alertness: Awake/alert Behavior During Therapy: WFL for tasks assessed/performed Overall Cognitive Status: Within Functional Limits for tasks assessed                                          Exercises Other Exercises Other Exercises: horizontal and vertical head turns with focus on target at endrange (until target stops  moving and then turn head other direction) x 5 reps each way. Pt reports he has done hourly. Today is easier to look to left and to the right makes him more dizzy (??)    General Comments General comments (skin integrity, edema, etc.): Pt still reporting spinning that lasts for seconds to minutes. Pt did have lots of vomiting on 12/23 and ?could have BPPV. Unable  to assess this date as neurologist came to do assessment      Pertinent Vitals/Pain Pain Assessment: No/denies pain    Home Living                          Prior Function            PT Goals (current goals can now be found in the care plan section) Acute Rehab PT Goals Patient Stated Goal: to relieve dizziness and return to independence Time For Goal Achievement: 09/11/21 Potential to Achieve Goals: Good Progress towards PT goals: Not progressing toward goals - comment (required incr assist to balance compared to 12/26)    Frequency    Min 3X/week      PT Plan Discharge plan needs to be updated    Co-evaluation              AM-PAC PT "6 Clicks" Mobility   Outcome Measure  Help needed turning from your back to your side while in a flat bed without using bedrails?: A Little Help needed moving from lying on your back to sitting on the side of a flat bed without using bedrails?: A Little Help needed moving to and from a bed to a chair (including a wheelchair)?: A Little Help needed standing up from a chair using your arms (e.g., wheelchair or bedside chair)?: A Little Help needed to walk in hospital room?: A Little Help needed climbing 3-5 steps with a railing? : A Little 6 Click Score: 18    End of Session Equipment Utilized During Treatment: Gait belt Activity Tolerance: Treatment limited secondary to medical complications (Comment) (feels worse than yesterday; limited by vertigo) Patient left: in bed;with call bell/phone within reach;with family/visitor present Nurse Communication: Mobility status PT Visit Diagnosis: Dizziness and giddiness (R42)     Time: 9449-6759 PT Time Calculation (min) (ACUTE ONLY): 38 min  Charges:  $Gait Training: 23-37 mins $Self Care/Home Management: 8-22                      Arby Barrette, [place]  Pager [phone] Office [phone]    Rexanne Mano 08/31/2021, 4:24 PM

## 2021-09-01 DIAGNOSIS — D6851 Activated protein C resistance: Secondary | ICD-10-CM | POA: Diagnosis not present

## 2021-09-01 DIAGNOSIS — I480 Paroxysmal atrial fibrillation: Secondary | ICD-10-CM

## 2021-09-01 DIAGNOSIS — R42 Dizziness and giddiness: Secondary | ICD-10-CM | POA: Diagnosis not present

## 2021-09-01 DIAGNOSIS — H905 Unspecified sensorineural hearing loss: Secondary | ICD-10-CM

## 2021-09-01 DIAGNOSIS — Z7901 Long term (current) use of anticoagulants: Secondary | ICD-10-CM

## 2021-09-01 LAB — BASIC METABOLIC PANEL
Anion gap: 11 (ref 5–15)
BUN: 29 mg/dL — ABNORMAL HIGH (ref 8–23)
CO2: 24 mmol/L (ref 22–32)
Calcium: 9 mg/dL (ref 8.9–10.3)
Chloride: 102 mmol/L (ref 98–111)
Creatinine, Ser: 1.2 mg/dL (ref 0.61–1.24)
GFR, Estimated: >60 mL/min (ref >60)
Glucose, Bld: 153 mg/dL — ABNORMAL HIGH (ref 70–99)
Potassium: 4.3 mmol/L (ref 3.5–5.1)
Sodium: 137 mmol/L (ref 135–145)

## 2021-09-01 LAB — CBC
HCT: 46.5 % (ref 39.0–52.0)
Hemoglobin: 15.4 g/dL (ref 13.0–17.0)
MCH: 28.7 pg (ref 26.0–34.0)
MCHC: 33.1 g/dL (ref 30.0–36.0)
MCV: 86.6 fL (ref 80.0–100.0)
Platelets: 284 10*3/uL (ref 150–400)
RBC: 5.37 MIL/uL (ref 4.22–5.81)
RDW: 14.1 % (ref 11.5–15.5)
WBC: 11.8 10*3/uL — ABNORMAL HIGH (ref 4.0–10.5)
nRBC: 0 % (ref 0.0–0.2)

## 2021-09-01 LAB — PROTIME-INR
INR: 2.8 — ABNORMAL HIGH (ref 0.8–1.2)
Prothrombin Time: 29.5 seconds — ABNORMAL HIGH (ref 11.4–15.2)

## 2021-09-01 LAB — MAGNESIUM: Magnesium: 2 mg/dL (ref 1.7–2.4)

## 2021-09-01 MED ORDER — WARFARIN SODIUM 4 MG PO TABS
4.0000 mg | ORAL_TABLET | Freq: Once | ORAL | Status: AC
  Administered 2021-09-01: 18:00:00 4 mg via ORAL
  Filled 2021-09-01: qty 1

## 2021-09-01 NOTE — Progress Notes (Signed)
ANTICOAGULATION CONSULT NOTE - Follow-up Consult  Pharmacy Consult for Coumadin Indication:  Afib and h/o recurrent/chronic VTE w/ factor V Leiden  Allergies  Allergen Reactions   Niacin And Related     Hepatitis   Phisohex [Hexachlorophene]     rash   Topamax [Topiramate]     suicidal   Vital Signs: Temp: 97.3 F (36.3 C) (12/28 0757) Temp Source: Oral (12/28 0757) BP: 144/93 (12/28 0757) Pulse Rate: 68 (12/28 0757)  Labs: Recent Labs    08/30/21 0237 08/31/21 0157 09/01/21 0128  HGB 14.9 15.0 15.4  HCT 45.5 46.6 46.5  PLT 195 255 284  LABPROT 38.5* 33.4* 29.5*  INR 3.9* 3.3* 2.8*  CREATININE 1.17 1.18 1.20     Medical History: Past Medical History:  Diagnosis Date   Ankle impingement syndrome    Right ankle from bone spur   Antiphospholipid antibody syndrome (HCC) 06/12/2012   Coagulopathy ([place]) 06/12/2012   DVT (deep venous thrombosis) ([place]) 1990, 2002   chronic hypercoagulobility.    DVT, recurrent, lower extremity, chronic 06/12/2012   1992   Dysrhythmia    AF   GERD (gastroesophageal reflux disease)    H/O tinnitus    Heterozygous factor V Leiden mutation ([place]) 06/12/2012   Hyperlipidemia    Migraine headache    Protein S deficiency ([place]) 06/12/2012   Prothrombin gene mutation ([place]) 06/12/2012   Pulmonary embolism ([place])    Pulmonary embolism, bilateral ([place]) 06/12/2012   July, 2002   PVC (premature ventricular contraction)    Varicose veins 06/12/2012   RLE   Wears hearing aid     Assessment: 70yo male c/o sudden loss of hearing in L ear and severe vertigo, to continue Coumadin for Afib and h/o VTE/factor V Leiden. Patient took warfarin 12/23 before admission. Last dose after admission is 12/25. PTA regimen: warfarin 5 mg daily.   After being supratherapeutic, INR is now therapeutic at 2.8 s/p 2 held doses. CBC stable. No signs of bleeding noted. Due to large increase in INR after PTA regimen was previously resumed, will give slightly lower dose today  and adjust per INR.  Goal of Therapy:  INR 2-3   Plan:  Give warfarin 4 mg today Check INR with AM labs tomorrow  Thank you for allowing pharmacy to participate in this patient's care.  Reatha Harps, PharmD PGY1 Pharmacy Resident 09/01/2021 8:43 AM Check [email or web address] for unit specific pharmacy number

## 2021-09-01 NOTE — Progress Notes (Signed)
Inpatient Rehab Admissions Coordinator:   CIR consult received. Pt. Appears to have only needs for vestibular therapy and this is not enough to justify a CIR admission. Insurance also is very unlikely to approve this dx. Recommend other rehab venues be pursued.   Clemens Catholic, [employer], [place] Admissions Coordinator  [phone] ([place]) [phone] (office)

## 2021-09-01 NOTE — Progress Notes (Signed)
Physical Therapy Treatment Patient Details Name: Jamichael Knotts MRN: [id number] DOB: Jun 22, 1951 Today's Date: 09/01/2021   History of Present Illness 70 y.o. male presents to [facility] on 08/27/2021 with hearing loss in left ear, nausea, vomiting, dizziness. MRI negative for acute findings. PMH includes afb, HTN, AAA, GERD, R ear sensorineural hearing loss.    PT Comments    Patient overall much better today. Continues to have vertigo with any change of position, however recovery time is becoming less and severity is less. Able to walk 120 ft with RW with minguard assist. Discussed stair entry into home and wife can have teenage grandson assist them into home. Educated on compensatory strategies for riding in car as this can certainly trigger the vertigo (in case of discharge today).     Recommendations for follow up therapy are one component of a multi-disciplinary discharge planning process, led by the attending physician.  Recommendations may be updated based on patient status, additional functional criteria and insurance authorization.  Follow Up Recommendations  Outpatient PT (vestibular PT)     Assistance Recommended at Discharge Frequent or constant Supervision/Assistance  Equipment Recommendations  Rolling walker (2 wheels);BSC/3in1    Recommendations for Other Services       Precautions / Restrictions Precautions Precautions: Fall Precaution Comments: nystagmus now imperceptible     Mobility  Bed Mobility Overal bed mobility: Needs Assistance Bed Mobility: Sit to Supine;Supine to Sit Rolling: Modified independent (Device/Increase time) Sidelying to sit: Supervision   Sit to supine: Modified independent (Device/Increase time)   General bed mobility comments: increased time; vc for visual fixation and to wait for symptoms to settle before making next move    Transfers Overall transfer level: Needs assistance Equipment used: Rolling walker (2 wheels) Transfers:  Sit to/from Stand Sit to Stand: Min assist           General transfer comment: vc for safe use of RW; assist to stabilize RW    Ambulation/Gait Ambulation/Gait assistance: Min guard Gait Distance (Feet): 120 Feet Assistive device: Rolling walker (2 wheels) Gait Pattern/deviations: Step-to pattern;Step-through pattern;Decreased stride length;Trunk flexed Gait velocity: appropriately slow     General Gait Details: giat much more steady compared to 12/27; abl eto stare at floor or crossbar of RW and have less vertigo; very mild feeling of falling to the left, but no actual imbalance   Stairs             Wheelchair Mobility    Modified Rankin (Stroke Patients Only)       Balance Overall balance assessment: Needs assistance Sitting-balance support: Feet supported Sitting balance-Leahy Scale: Fair     Standing balance support: Bilateral upper extremity supported;Reliant on assistive device for balance Standing balance-Leahy Scale: Poor Standing balance comment: verbal cues for position and posture                            Cognition Arousal/Alertness: Awake/alert Behavior During Therapy: WFL for tasks assessed/performed Overall Cognitive Status: Within Functional Limits for tasks assessed                                          Exercises      General Comments General comments (skin integrity, edema, etc.): Performed left Dix-Hallpike with negative result (no vertigo or nystagmus). Discussed AIR was not a possibility. Discussed possible d/c home as he is  doing better today. Educated on compensation techniques for car ride home. Educated in general how to continue to increase his activity and how to monitor symptoms to be his guide in potential for overdoing activity.      Pertinent Vitals/Pain Pain Assessment: No/denies pain    Home Living Family/patient expects to be discharged to:: Private residence Living Arrangements:  Spouse/significant other Available Help at Discharge: Family Type of Home: House Home Access: Stairs to enter Entrance Stairs-Rails: Can reach both Entrance Stairs-Number of Steps: 3   Home Layout: Multi-level;Able to live on main level with bedroom/bathroom Home Equipment: None      Prior Function            PT Goals (current goals can now be found in the care plan section) Acute Rehab PT Goals Patient Stated Goal: to relieve dizziness and return to independence PT Goal Formulation: With patient Time For Goal Achievement: 09/11/21 Potential to Achieve Goals: Good Progress towards PT goals: Progressing toward goals    Frequency    Min 3X/week      PT Plan Discharge plan needs to be updated    Co-evaluation              AM-PAC PT "6 Clicks" Mobility   Outcome Measure  Help needed turning from your back to your side while in a flat bed without using bedrails?: None Help needed moving from lying on your back to sitting on the side of a flat bed without using bedrails?: None Help needed moving to and from a bed to a chair (including a wheelchair)?: A Little Help needed standing up from a chair using your arms (e.g., wheelchair or bedside chair)?: A Little Help needed to walk in hospital room?: A Little Help needed climbing 3-5 steps with a railing? : A Little 6 Click Score: 20    End of Session Equipment Utilized During Treatment: Gait belt Activity Tolerance: Patient tolerated treatment well Patient left: in bed;with call bell/phone within reach;with family/visitor present Nurse Communication: Mobility status PT Visit Diagnosis: Dizziness and giddiness (R42) BPPV - Right/Left : Left     Time: 1051-1130 PT Time Calculation (min) (ACUTE ONLY): 39 min  Charges:  $Gait Training: 23-37 mins $Self Care/Home Management: 8-22                      Arby Barrette, [place]  Pager [phone] Office [phone]    Rexanne Mano 09/01/2021, 11:41 AM

## 2021-09-01 NOTE — Progress Notes (Signed)
Occupational Therapy Treatment Patient Details Name: Don Ruiz MRN: [id number] DOB: [birth year] Today's Date: 09/01/2021   History of present illness 70 y.o. male presents to [facility] hospital on 08/27/2021 with hearing loss in left ear, nausea, vomiting, dizziness. MRI negative for acute findings. PMH includes afb, HTN, AAA, GERD, R ear sensorineural hearing loss.   OT comments  Patient continues to have vertigo with movement but patient is performing techniques to minimize symptoms.  Patient was able to perform grooming tasks standing at sink with one episode of vertigo.  Static standing performed to address balance and safety. Acute OT to continue to follow.     Recommendations for follow up therapy are one component of a multi-disciplinary discharge planning process, led by the attending physician.  Recommendations may be updated based on patient status, additional functional criteria and insurance authorization.    Follow Up Recommendations  No OT follow up    Assistance Recommended at Discharge Frequent or constant Supervision/Assistance  Equipment Recommendations  BSC/3in1;Other (comment) (pending progress)    Recommendations for Other Services      Precautions / Restrictions Precautions Precautions: Fall Precaution Comments: nystagmus now imperceptible       Mobility Bed Mobility Overal bed mobility: Needs Assistance Bed Mobility: Sit to Supine;Supine to Sit Rolling: Modified independent (Device/Increase time) Sidelying to sit: Supervision   Sit to supine: Modified independent (Device/Increase time)   General bed mobility comments: increased time due need for visual fixation and to wair for symptoms to settle before attempting to stand    Transfers Overall transfer level: Needs assistance Equipment used: Rolling walker (2 wheels) Transfers: Sit to/from Stand Sit to Stand: Min assist           General transfer comment: verbal cues for hand placment and safety      Balance Overall balance assessment: Needs assistance Sitting-balance support: Feet supported Sitting balance-Leahy Scale: Fair     Standing balance support: Bilateral upper extremity supported;Single extremity supported;Reliant on assistive device for balance Standing balance-Leahy Scale: Poor Standing balance comment: stood at sink for grooming tasks with limited head movement to allow for visual fixation                           ADL either performed or assessed with clinical judgement   ADL Overall ADL's : Needs assistance/impaired     Grooming: Wash/dry hands;Wash/dry face;Oral care;Brushing hair;Standing;Min guard Grooming Details (indicate cue type and reason): min guard during standing at sink due to dizziness                             Functional mobility during ADLs: Minimal assistance;Rolling walker (2 wheels) General ADL Comments: min assist with functional mobility due to safety    Extremity/Trunk Assessment         Cervical / Trunk Assessment Cervical / Trunk Assessment: Normal    Vision       Perception     Praxis      Cognition Arousal/Alertness: Awake/alert Behavior During Therapy: WFL for tasks assessed/performed Overall Cognitive Status: Within Functional Limits for tasks assessed                                 General Comments: aware of safety issues with nystagmus          Exercises     Shoulder Instructions  General Comments performed static standing to address balance    Pertinent Vitals/ Pain       Pain Assessment: No/denies pain  Home Living Family/patient expects to be discharged to:: Private residence Living Arrangements: Spouse/significant other Available Help at Discharge: Family Type of Home: House Home Access: Stairs to enter Technical brewer of Steps: 3 Entrance Stairs-Rails: Can reach both Home Layout: Multi-level;Able to live on main level with bedroom/bathroom                Home Equipment: None          Prior Functioning/Environment              Frequency  Min 2X/week        Progress Toward Goals  OT Goals(current goals can now be found in the care plan section)  Progress towards OT goals: Progressing toward goals  Acute Rehab OT Goals Patient Stated Goal: get better OT Goal Formulation: With patient Time For Goal Achievement: 09/13/21 Potential to Achieve Goals: Fair ADL Goals Pt Will Perform Grooming: with modified independence;standing Pt Will Perform Lower Body Bathing: with modified independence;sit to/from stand Pt Will Perform Lower Body Dressing: with modified independence;sit to/from stand Pt Will Transfer to Toilet: with modified independence;ambulating Pt/caregiver will Perform Home Exercise Program: Increased ROM;Increased strength;Both right and left upper extremity;With written HEP provided  Plan Discharge plan remains appropriate    Co-evaluation                 AM-PAC OT "6 Clicks" Daily Activity     Outcome Measure   Help from another person eating meals?: None Help from another person taking care of personal grooming?: A Little Help from another person toileting, which includes using toliet, bedpan, or urinal?: A Little Help from another person bathing (including washing, rinsing, drying)?: A Lot Help from another person to put on and taking off regular upper body clothing?: A Little Help from another person to put on and taking off regular lower body clothing?: A Lot 6 Click Score: 17    End of Session Equipment Utilized During Treatment: Gait belt;Rolling walker (2 wheels)  OT Visit Diagnosis: Unsteadiness on feet (R26.81);Other abnormalities of gait and mobility (R26.89);Muscle weakness (generalized) (M62.81);Dizziness and giddiness (R42)   Activity Tolerance Patient tolerated treatment well   Patient Left in bed;with call bell/phone within reach;with family/visitor present    Nurse Communication Mobility status        Time: 2952-8413 OT Time Calculation (min): 21 min  Charges: OT General Charges $OT Visit: 1 Visit OT Treatments $Self Care/Home Management : 8-22 mins  Lodema Hong, [place]  Pager [phone] Office [place] 09/01/2021, 1:25 PM

## 2021-09-01 NOTE — Progress Notes (Signed)
PROGRESS NOTE    Don Ruiz  OEU:[id number] DOB: 11-05-1950 DOA: 08/27/2021 PCP: Lavone Orn, MD   Brief Narrative:  70 year old with history of atrial fibrillation, factor V Leiden deficiency, HTN, abdominal aortic aneurysm, GERD, sensorineural hearing deafness in the right ear and complete hearing loss in the left presents with comes to the hospital complains of significant dizziness.  Patient states he went to [facility] ENT surgeon day prior to admission had had steroid injection and intratympanic left ear by Dr. Thornell Mule.  Since then he has had significant amount of dizziness, nausea and vomiting especially with any head movement.  He has been started on meclizine and scopolamine patch.  MRI of the brain was negative.  Also spoke with Dr. Barnetta Chapel as this type of dizziness is expected after steroid injection but typically for 30 minutes or so but if lasting any longer, trial of Valium can be helpful. Neurology consulted.    Assessment & Plan:   Principal Problem:   Vertigo Active Problems:   Aneurysm of abdominal vessel   Blood clotting disorder (HCC)   Heterozygous factor V Leiden mutation (HCC)   Chronic anticoagulation   PAF (paroxysmal atrial fibrillation) (HCC)   Vertigo with sensorineural hearing loss in both ears - Persistent dizziness. follows outpatient with Dr. Elwyn Reach from ENT.  Recently had steroid injection and the stable symptoms are expected.  MRI of the brain is negative. -On Valium 10 mg oral 3 times daily, appears to be with some improvement today . -Continue with PT/OT, will need outpatient PT vestibular  -I have discussed with patient primary ENT Dr. Elwyn Reach (provided his cell phone for any questions [phone]), patient with factor V Leyden deficiency, most likely having vestibular ischemic event bite: Fall anticoagulation, recommendation to continue with prednisone 20 mg 3 times daily total of 7 days, then taper to 20 mg twice daily x2 days, then 10 mg oral  daily x2 days then DC after, and he will follow as an outpatient. -Patient remains quite symptomatic, can try Versed 1 g every 6 hours as needed with close monitoring   Atrial fibrillation and factor V Leiden mutation-continue Coumadin.  Pharmacy to dose   Essential hypertension-continue home medicine.  IV meds as needed   Abdominal aortic aneurysm status postrepair-follow-up outpatient   Hyperlipidemia- statin  DVT prophylaxis: On Coumadin Code Status: Full code Family Communication: Significant other at bedside  Still very dizzy, neurology consulted for their input  Subjective: Still feeling dizzy even with any movement.   Examination:  Awake Alert, Oriented X 3, No new F.N deficits, Normal affect Symmetrical Chest wall movement, Good air movement bilaterally, CTAB RRR,No Gallops,Rubs or new Murmurs, No Parasternal Heave +ve B.Sounds, Abd Soft, No tenderness, No rebound - guarding or rigidity. No Cyanosis, Clubbing or edema, No new Rash or bruise     Objective: Vitals:   08/31/21 2000 08/31/21 2310 09/01/21 0409 09/01/21 0757  BP: (!) 136/92 (!) 147/88 (!) 130/94 (!) 144/93  Pulse: 69 64 68 68  Resp: 17 (!) 23 15 18   Temp: 97.6 F (36.4 C) (!) 97.5 F (36.4 C) 97.7 F (36.5 C) (!) 97.3 F (36.3 C)  TempSrc: Axillary Oral Axillary Oral  SpO2: 90% 90% 91% 90%  Weight:      Height:        Intake/Output Summary (Last 24 hours) at 09/01/2021 1336 Last data filed at 09/01/2021 0409 Gross per 24 hour  Intake 480 ml  Output 300 ml  Net 180 ml   Filed  Weights   08/28/21 1541  Weight: 99.3 kg     Data Reviewed:   CBC: Recent Labs  Lab 08/27/21 2005 08/28/21 2342 08/30/21 0237 08/31/21 0157 09/01/21 0128  WBC 9.6 11.7* 12.0* 11.7* 11.8*  NEUTROABS 8.5*  --   --   --   --   HGB 14.3 14.2 14.9 15.0 15.4  HCT 43.5 44.5 45.5 46.6 46.5  MCV 87.7 87.1 86.3 87.6 86.6  PLT 209 185 195 255 053   Basic Metabolic Panel: Recent Labs  Lab 08/27/21 2005  08/28/21 2342 08/30/21 0237 08/31/21 0157 09/01/21 0128  NA 136 138 140 138 137  K 3.8 4.1 3.8 4.3 4.3  CL 105 105 104 105 102  CO2 21* 25 25 24 24   GLUCOSE 171* 127* 124* 122* 153*  BUN 23 20 23  25* 29*  CREATININE 1.19 1.21 1.17 1.18 1.20  CALCIUM 8.7* 8.9 8.8* 9.0 9.0  MG  --  2.3 2.3 2.0 2.0   GFR: Estimated Creatinine Clearance: 70.4 mL/min (by C-G formula based on SCr of 1.2 mg/dL). Liver Function Tests: No results for input(s): AST, ALT, ALKPHOS, BILITOT, PROT, ALBUMIN in the last 168 hours. No results for input(s): LIPASE, AMYLASE in the last 168 hours. No results for input(s): AMMONIA in the last 168 hours. Coagulation Profile: Recent Labs  Lab 08/27/21 2005 08/28/21 2342 08/30/21 0237 08/31/21 0157 09/01/21 0128  INR 2.3* 3.0* 3.9* 3.3* 2.8*   Cardiac Enzymes: No results for input(s): CKTOTAL, CKMB, CKMBINDEX, TROPONINI in the last 168 hours. BNP (last 3 results) No results for input(s): PROBNP in the last 8760 hours. HbA1C: No results for input(s): HGBA1C in the last 72 hours. CBG: No results for input(s): GLUCAP in the last 168 hours. Lipid Profile: No results for input(s): CHOL, HDL, LDLCALC, TRIG, CHOLHDL, LDLDIRECT in the last 72 hours. Thyroid Function Tests: No results for input(s): TSH, T4TOTAL, FREET4, T3FREE, THYROIDAB in the last 72 hours. Anemia Panel: No results for input(s): VITAMINB12, FOLATE, FERRITIN, TIBC, IRON, RETICCTPCT in the last 72 hours. Sepsis Labs: No results for input(s): PROCALCITON, LATICACIDVEN in the last 168 hours.  Recent Results (from the past 240 hour(s))  Resp Panel by RT-PCR (Flu A&B, Covid) Nasopharyngeal Swab     Status: None   Collection Time: 08/28/21  3:56 AM   Specimen: Nasopharyngeal Swab; Nasopharyngeal(NP) swabs in vial transport medium  Result Value Ref Range Status   SARS Coronavirus 2 by RT PCR NEGATIVE NEGATIVE Final    Comment: (NOTE) SARS-CoV-2 target nucleic acids are NOT DETECTED.  The SARS-CoV-2  RNA is generally detectable in upper respiratory specimens during the acute phase of infection. The lowest concentration of SARS-CoV-2 viral copies this assay can detect is 138 copies/mL. A negative result does not preclude SARS-Cov-2 infection and should not be used as the sole basis for treatment or other patient management decisions. A negative result may occur with  improper specimen collection/handling, submission of specimen other than nasopharyngeal swab, presence of viral mutation(s) within the areas targeted by this assay, and inadequate number of viral copies(<138 copies/mL). A negative result must be combined with clinical observations, patient history, and epidemiological information. The expected result is Negative.  Fact Sheet for Patients:  EntrepreneurPulse.com.au  Fact Sheet for Healthcare Providers:  IncredibleEmployment.be  This test is no t yet approved or cleared by the [place] FDA and  has been authorized for detection and/or diagnosis of SARS-CoV-2 by FDA under an Emergency Use Authorization (EUA). This EUA will remain  in  effect (meaning this test can be used) for the duration of the COVID-19 declaration under Section 564(b)(1) of the Act, 21 U.S.C.section 360bbb-3(b)(1), unless the authorization is terminated  or revoked sooner.       Influenza A by PCR NEGATIVE NEGATIVE Final   Influenza B by PCR NEGATIVE NEGATIVE Final    Comment: (NOTE) The Xpert Xpress SARS-CoV-2/FLU/RSV plus assay is intended as an aid in the diagnosis of influenza from Nasopharyngeal swab specimens and should not be used as a sole basis for treatment. Nasal washings and aspirates are unacceptable for Xpert Xpress SARS-CoV-2/FLU/RSV testing.  Fact Sheet for Patients: EntrepreneurPulse.com.au  Fact Sheet for Healthcare Providers: IncredibleEmployment.be  This test is not yet approved or cleared by the  [place] FDA and has been authorized for detection and/or diagnosis of SARS-CoV-2 by FDA under an Emergency Use Authorization (EUA). This EUA will remain in effect (meaning this test can be used) for the duration of the COVID-19 declaration under Section 564(b)(1) of the Act, 21 U.S.C. section 360bbb-3(b)(1), unless the authorization is terminated or revoked.  Performed at [facility], [place] [address] [place], [place] [address]          Radiology Studies: No results found.      Scheduled Meds:  calcium carbonate  1 tablet Oral TID WC   diazepam  10 mg Oral Q8H   ezetimibe  10 mg Oral Daily   gabapentin  300 mg Oral QHS   gabapentin  600 mg Oral Daily   meclizine  25 mg Oral TID   pravastatin  80 mg Oral Daily   predniSONE  20 mg Oral TID   ramipril  10 mg Oral Daily   scopolamine  1 patch Transdermal Q72H   warfarin  4 mg Oral ONCE-1600   Warfarin - Pharmacist Dosing Inpatient   Does not apply q1600   Continuous Infusions:   LOS: 2 days      Phillips Climes, MD [facility]  If 7PM-7AM, please contact night-coverage  09/01/2021, 1:36 PM

## 2021-09-02 ENCOUNTER — Other Ambulatory Visit (HOSPITAL_COMMUNITY): Payer: Self-pay

## 2021-09-02 DIAGNOSIS — D6851 Activated protein C resistance: Secondary | ICD-10-CM | POA: Diagnosis not present

## 2021-09-02 DIAGNOSIS — Z7901 Long term (current) use of anticoagulants: Secondary | ICD-10-CM | POA: Diagnosis not present

## 2021-09-02 DIAGNOSIS — I480 Paroxysmal atrial fibrillation: Secondary | ICD-10-CM | POA: Diagnosis not present

## 2021-09-02 DIAGNOSIS — R42 Dizziness and giddiness: Secondary | ICD-10-CM | POA: Diagnosis not present

## 2021-09-02 LAB — CBC
HCT: 46.1 % (ref 39.0–52.0)
Hemoglobin: 15.5 g/dL (ref 13.0–17.0)
MCH: 29.1 pg (ref 26.0–34.0)
MCHC: 33.6 g/dL (ref 30.0–36.0)
MCV: 86.5 fL (ref 80.0–100.0)
Platelets: 265 10*3/uL (ref 150–400)
RBC: 5.33 MIL/uL (ref 4.22–5.81)
RDW: 13.9 % (ref 11.5–15.5)
WBC: 11.1 10*3/uL — ABNORMAL HIGH (ref 4.0–10.5)
nRBC: 0 % (ref 0.0–0.2)

## 2021-09-02 LAB — BASIC METABOLIC PANEL
Anion gap: 9 (ref 5–15)
BUN: 29 mg/dL — ABNORMAL HIGH (ref 8–23)
CO2: 24 mmol/L (ref 22–32)
Calcium: 9.2 mg/dL (ref 8.9–10.3)
Chloride: 102 mmol/L (ref 98–111)
Creatinine, Ser: 1.19 mg/dL (ref 0.61–1.24)
GFR, Estimated: >60 mL/min (ref >60)
Glucose, Bld: 129 mg/dL — ABNORMAL HIGH (ref 70–99)
Potassium: 4.8 mmol/L (ref 3.5–5.1)
Sodium: 135 mmol/L (ref 135–145)

## 2021-09-02 LAB — MAGNESIUM: Magnesium: 2.3 mg/dL (ref 1.7–2.4)

## 2021-09-02 LAB — PROTIME-INR
INR: 2.1 — ABNORMAL HIGH (ref 0.8–1.2)
Prothrombin Time: 23.5 seconds — ABNORMAL HIGH (ref 11.4–15.2)

## 2021-09-02 MED ORDER — PANTOPRAZOLE SODIUM 40 MG PO TBEC
40.0000 mg | DELAYED_RELEASE_TABLET | Freq: Every day | ORAL | 0 refills | Status: DC
  Filled 2021-09-02: qty 30, 30d supply, fill #0

## 2021-09-02 MED ORDER — DIAZEPAM 5 MG PO TABS
10.0000 mg | ORAL_TABLET | Freq: Three times a day (TID) | ORAL | 0 refills | Status: AC | PRN
  Filled 2021-09-02: qty 42, 7d supply, fill #0

## 2021-09-02 MED ORDER — ACETAMINOPHEN 325 MG PO TABS: 650.0000 mg | ORAL_TABLET | Freq: Four times a day (QID) | ORAL | Status: AC | PRN

## 2021-09-02 MED ORDER — PREDNISONE 20 MG PO TABS
ORAL_TABLET | ORAL | 0 refills | Status: AC
  Filled 2021-09-02: qty 18, 8d supply, fill #0

## 2021-09-02 MED ORDER — DIAZEPAM 5 MG PO TABS
10.0000 mg | ORAL_TABLET | Freq: Three times a day (TID) | ORAL | Status: DC
  Administered 2021-09-02: 12:00:00 10 mg via ORAL
  Filled 2021-09-02: qty 2

## 2021-09-02 MED ORDER — WARFARIN SODIUM 4 MG PO TABS
4.0000 mg | ORAL_TABLET | Freq: Once | ORAL | Status: DC
  Filled 2021-09-02: qty 1

## 2021-09-02 NOTE — Progress Notes (Signed)
Physical Therapy Treatment Patient Details Name: Don Ruiz MRN: [id number] DOB: Aug 12, 1951 Today's Date: 09/02/2021   History of Present Illness 70 y.o. male presents to [facility] hospital on 08/27/2021 with hearing loss in left ear, nausea, vomiting, dizziness. MRI negative for acute findings. PMH includes afb, HTN, AAA, GERD, R ear sensorineural hearing loss.    PT Comments    Patient continues to improve slowly. His vertigo persists, however severity and duration has lessened. He is using visual fixation to lessen symptoms. He was able to walk 180 ft with RW. Notified MD and TOC team that pt still needs BSC and OPPT (vestibular rehab) prior to discharge home.     Recommendations for follow up therapy are one component of a multi-disciplinary discharge planning process, led by the attending physician.  Recommendations may be updated based on patient status, additional functional criteria and insurance authorization.  Follow Up Recommendations  Outpatient PT (vestibular PT (pt prefers at [facility]))     Assistance Recommended at Discharge Intermittent Supervision/Assistance  Equipment Recommendations  Rolling walker (2 wheels);BSC/3in1    Recommendations for Other Services       Precautions / Restrictions Precautions Precautions: Fall Precaution Comments: nystagmus now imperceptible     Mobility  Bed Mobility Overal bed mobility: Needs Assistance Bed Mobility: Sit to Supine;Supine to Sit     Supine to sit: Modified independent (Device/Increase time) Sit to supine: Modified independent (Device/Increase time)   General bed mobility comments: increased time due need for visual fixation and to wait for symptoms to settle before attempting to stand    Transfers Overall transfer level: Needs assistance Equipment used: Rolling walker (2 wheels) Transfers: Sit to/from Stand Sit to Stand: Min guard           General transfer comment: verbal cues for hand  placment and safety    Ambulation/Gait Ambulation/Gait assistance: Min guard Gait Distance (Feet): 180 Feet Assistive device: Rolling walker (2 wheels) Gait Pattern/deviations: Step-to pattern;Step-through pattern;Decreased stride length;Trunk flexed Gait velocity: appropriately slow     General Gait Details: gait continues to improve. Pt reports feeling of left lean is less and spinning sensation is much less   Stairs             Wheelchair Mobility    Modified Rankin (Stroke Patients Only)       Balance Overall balance assessment: Needs assistance Sitting-balance support: Feet supported Sitting balance-Leahy Scale: Fair     Standing balance support: Bilateral upper extremity supported;Single extremity supported;Reliant on assistive device for balance Standing balance-Leahy Scale: Poor                              Cognition Arousal/Alertness: Awake/alert Behavior During Therapy: WFL for tasks assessed/performed Overall Cognitive Status: Within Functional Limits for tasks assessed                                 General Comments: aware of safety issues with vertigo        Exercises      General Comments General comments (skin integrity, edema, etc.): Reiterated techniques for riding in car to minimize symptoms. encouraged increased activity at home and beginning to transition from looking down to looking up and fixing eyes on a target.      Pertinent Vitals/Pain Pain Assessment: No/denies pain    Home Living  Prior Function            PT Goals (current goals can now be found in the care plan section) Acute Rehab PT Goals Patient Stated Goal: to relieve dizziness and return to independence PT Goal Formulation: With patient Time For Goal Achievement: 09/11/21 Potential to Achieve Goals: Good Progress towards PT goals: Progressing toward goals    Frequency    Min 3X/week      PT  Plan Current plan remains appropriate    Co-evaluation              AM-PAC PT "6 Clicks" Mobility   Outcome Measure  Help needed turning from your back to your side while in a flat bed without using bedrails?: None Help needed moving from lying on your back to sitting on the side of a flat bed without using bedrails?: None Help needed moving to and from a bed to a chair (including a wheelchair)?: A Little Help needed standing up from a chair using your arms (e.g., wheelchair or bedside chair)?: A Little Help needed to walk in hospital room?: A Little Help needed climbing 3-5 steps with a railing? : A Little 6 Click Score: 20    End of Session Equipment Utilized During Treatment: Gait belt Activity Tolerance: Patient tolerated treatment well Patient left: in bed;with call bell/phone within reach;with family/visitor present Nurse Communication: Other (comment) (ok for dc from PT perspective; needs BSC prior to home) PT Visit Diagnosis: Dizziness and giddiness (R42) BPPV - Right/Left : Left     Time: 0174-9449 PT Time Calculation (min) (ACUTE ONLY): 26 min  Charges:  $Gait Training: 23-37 mins                      Arby Barrette, PT Acute Rehabilitation Services  Pager [phone] Office [phone]    Rexanne Mano 09/02/2021, 11:23 AM

## 2021-09-02 NOTE — Care Management Important Message (Signed)
Important Message  Patient Details  Name: Don Ruiz MRN: [id number] Date of Birth: March 22, 1951   Medicare Important Message Given:  Yes     Kupono Marling Montine Circle 09/02/2021, 3:18 PM

## 2021-09-02 NOTE — TOC Transition Note (Signed)
Transition of Care [facility]) - CM/SW Discharge Note   Patient Details  Name: Don Ruiz MRN: [id number] Date of Birth: [birth year]  Transition of Care [facility] CM/SW Contact:  Cyndi Bender, RN Phone Number: 09/02/2021, 11:41 AM   Clinical Narrative:    Patient stable for discharge.  RW and 3&1 ordered from [employer]. 3&1 will be delivered to the room. Outpt referral for PT sent to [facility]      Barriers to Discharge: Barriers Resolved   Patient Goals and CMS Choice Patient states their goals for this hospitalization and ongoing recovery are:: return home      Discharge Placement  home                     Discharge Plan and Services   Discharge Planning Services: CM Consult            DME Arranged: 3-N-1, Walker rolling DME Agency: [employer] Date DME Agency Contacted: 09/02/21 Time DME Agency Contacted: [id number] Representative spoke with at DME Agency: [place] Determinants of Health ([place]) Interventions     Readmission Risk Interventions No flowsheet data found.

## 2021-09-02 NOTE — Discharge Summary (Addendum)
Physician Discharge Summary  Don Ruiz HEN:[id number] DOB: 03-23-1951 DOA: 08/27/2021  PCP: Lavone Orn, MD  Admit date: 08/27/2021 Discharge date: 09/02/2021  Admitted From: Home Disposition:  Home   Recommendations for Outpatient Follow-up:  Follow up with PCP in 1-2 weeks Patient [facility]  Home Health: outpatient vestibular PT  Discharge Condition:Stable CODE STATUS:FULL Diet recommendation: Heart Healthy   Brief/Interim Summary:  70 year old with history of atrial fibrillation, factor V Leiden deficiency, HTN, abdominal aortic aneurysm, GERD, sensorineural hearing deafness in the right ear and complete hearing loss in the left presents with comes to the hospital complains of significant dizziness.  Patient states he went to [facility] ENT surgeon day prior to admission had had steroid injection and intratympanic left ear by Dr. Thornell Mule.  Since then he has had significant amount of dizziness, nausea and vomiting especially with any head movement.  He has been started on meclizine and scopolamine patch.  MRI of the brain was negative.      Vertigo with sensorineural hearing loss in both ears - Persistent dizziness. follows outpatient with Dr. Elwyn Reach from ENT.  Recently had steroid injection .  MRI of the brain is negative. -On Valium 10 mg oral 3 times daily, this appears to be helping with his symptoms, it will be changed to 10 mg oral 3 times daily as needed. -Symptoms significantly debilitating initially, but this has significantly improved, has seen by PT/OT, vestibular PT has been arranged as an outpatient. -I have discussed with patient primary ENT Dr. Thornell Mule, patient with factor V Leyden deficiency, most likely having vestibular ischemic event despite full anticoagulation, recommendation to continue with prednisone 20 mg 3 times daily total of 7 days, then taper to 20 mg twice daily x2 days, then 10 mg oral daily x2 days then to DC steroids, and  he will follow as an outpatient.   Atrial fibrillation and factor V Leiden mutation-continue Coumadin.  Pharmacy to dose   Essential hypertension-continue home medicine.     Abdominal aortic aneurysm status postrepair-follow-up outpatient   Hyperlipidemia- statin  Discharge Diagnoses:  Principal Problem:   Vertigo Active Problems:   Aneurysm of abdominal vessel   Blood clotting disorder (HCC)   Heterozygous factor V Leiden mutation (HCC)   Chronic anticoagulation   PAF (paroxysmal atrial fibrillation) [facility])    Discharge Instructions  Discharge Instructions     Ambulatory referral to Physical Therapy   Complete by: As directed    Discharge instructions   Complete by: As directed    Follow with Primary MD Lavone Orn, MD in 7 days   Get CBC, CMP, checked  by Primary MD next visit.    Activity: As tolerated with Full fall precautions use walker/cane & assistance as needed   Disposition Home    Diet: regular diet   On your next visit with your primary care physician please Get Medicines reviewed and adjusted.   Please request your Prim.MD to go over all Hospital Tests and Procedure/Radiological results at the follow up, please get all Hospital records sent to your Prim MD by signing hospital release before you go home.   If you experience  worsening of your admission symptoms, develop shortness of breath, life threatening emergency, suicidal or homicidal thoughts you must seek medical attention immediately by calling 911 or calling your MD immediately  if symptoms less severe.  You Must read complete instructions/literature along with all the possible adverse reactions/side effects for all the Medicines you take and  that have been prescribed to you. Take any new Medicines after you have completely understood and accpet all the possible adverse reactions/side effects.   Do not drive, operating heavy machinery, perform activities at heights, swimming or  participation in water activities or provide baby sitting services if your were admitted for syncope or siezures until you have seen by Primary MD or a Neurologist and advised to do so again.  Do not drive when taking Pain medications.    Do not take more than prescribed Pain, Sleep and Anxiety Medications  Special Instructions: If you have smoked or chewed Tobacco  in the last 2 yrs please stop smoking, stop any regular Alcohol  and or any Recreational drug use.  Wear Seat belts while driving.   Please note  You were cared for by a hospitalist during your hospital stay. If you have any questions about your discharge medications or the care you received while you were in the hospital after you are discharged, you can call the unit and asked to speak with the hospitalist on call if the hospitalist that took care of you is not available. Once you are discharged, your primary care physician will handle any further medical issues. Please note that NO REFILLS for any discharge medications will be authorized once you are discharged, as it is imperative that you return to your primary care physician (or establish a relationship with a primary care physician if you do not have one) for your aftercare needs so that they can reassess your need for medications and monitor your lab values.   Increase activity slowly   Complete by: As directed       Allergies as of 09/02/2021       Reactions   Niacin And Related    Hepatitis   Phisohex [hexachlorophene]    rash   Topamax [topiramate]    suicidal        Medication List     STOP taking these medications    celecoxib 200 MG capsule Commonly known as: CELEBREX   oxyCODONE-acetaminophen 5-325 MG tablet Commonly known as: PERCOCET/ROXICET       TAKE these medications    acetaminophen 325 MG tablet Commonly known as: TYLENOL Take 2 tablets (650 mg total) by mouth every 6 (six) hours as needed for mild pain (or Fever >/= 101).    diazepam 5 MG tablet Commonly known as: VALIUM Take 2 tablets (10 mg total) by mouth every 8 (eight) hours as needed (vertigo).   diphenhydrAMINE 25 MG tablet Commonly known as: BENADRYL Take 25 mg by mouth as needed for sleep.   diphenhydramine-acetaminophen 25-500 MG Tabs tablet Commonly known as: TYLENOL PM Take 2 tablets by mouth at bedtime as needed (for sleep).   ezetimibe 10 MG tablet Commonly known as: ZETIA Take 1 tablet by mouth daily.   gabapentin 600 MG tablet Commonly known as: NEURONTIN Take 300-600 mg by mouth See admin instructions. 600 in the morning and 300 at night   multivitamin with minerals Tabs tablet Take 1 tablet by mouth daily.   nitroGLYCERIN 0.3 MG SL tablet Commonly known as: NITROSTAT Place 0.3 mg under the tongue every 5 (five) minutes as needed for chest pain.   pantoprazole 40 MG tablet Commonly known as: Protonix Take 1 tablet (40 mg total) by mouth daily.   pravastatin 80 MG tablet Commonly known as: PRAVACHOL Take 80 mg by mouth daily.   predniSONE 20 MG tablet Commonly known as: DELTASONE Take 1 tablet (20 mg  total) by mouth 3 (three) times daily for 4 days, THEN 1 tablet (20 mg total) 2 (two) times daily for 2 days, THEN 1 tablet (20 mg total) daily for 2 days. Start taking on: September 02, 2021   psyllium 58.6 % packet Commonly known as: METAMUCIL Take 1 packet by mouth daily.   ramipril 10 MG capsule Commonly known as: ALTACE Take 10 mg by mouth daily.   warfarin 5 MG tablet Commonly known as: COUMADIN Take 5 mg by mouth daily. 5 mg every day.               Durable Medical Equipment  (From admission, onward)           Start     Ordered   08/30/21 1603  For home use only DME Walker rolling  Once       Question Answer Comment  Walker: With [place] Wheels   Patient needs a walker to treat with the following condition Vertigo      08/30/21 1602            Follow-up Information     [facility]. Schedule an appointment as soon as possible for a visit.   Specialty: Rehabilitation Why: Call to make appointment Contact information: [address] [place], [place] [id number] [place] [address] [phone]               Allergies  Allergen Reactions   Niacin And Related     Hepatitis   Phisohex [Hexachlorophene]     rash   Topamax [Topiramate]     suicidal    Consultations: neurology   Procedures/Studies: CT Head Wo Contrast  Result Date: 08/27/2021 CLINICAL DATA:  Acute neuro deficit.  Sudden hearing loss left ear. EXAM: CT HEAD WITHOUT CONTRAST TECHNIQUE: Contiguous axial images were obtained from the base of the skull through the vertex without intravenous contrast. COMPARISON:  CT head 03/03/2014 FINDINGS: Brain: No evidence of acute infarction, hemorrhage, hydrocephalus, extra-axial collection or mass lesion/mass effect. Vascular: Negative for hyperdense vessel. Skull: Negative Sinuses/Orbits: Paranasal sinuses clear. Bilateral cataract extraction Other: None IMPRESSION: Negative CT head Electronically Signed   By: Franchot Gallo M.D.   On: 08/27/2021 19:55   MR BRAIN W WO CONTRAST  Result Date: 08/28/2021 CLINICAL DATA:  Dizziness, arrhythmia or new vasoactive medication vertigo, hearing loss, concern for stroke or retrocochlear lesion. Sudden loss of hearing in L ear this morning. Pt had similar event 12 years ago in R ear, never regained hearing. EXAM: MRI HEAD WITHOUT AND WITH CONTRAST TECHNIQUE: Multiplanar, multiecho pulse sequences of the brain and surrounding structures were obtained without and with intravenous contrast. CONTRAST:  16mL GADAVIST GADOBUTROL 1 MMOL/ML IV SOLN COMPARISON:  Brain MRI 12/31/2009 FINDINGS: Brain: No acute infarct, mass effect or extra-axial collection. No acute or chronic hemorrhage. There is multifocal hyperintense T2-weighted signal within the white matter. Generalized volume loss without a  clear lobar predilection. The midline structures are normal. Vascular: Major flow voids are preserved. Skull and upper cervical spine: Normal calvarium and skull base. Visualized upper cervical spine and soft tissues are normal. Sinuses/Orbits:No paranasal sinus fluid levels or advanced mucosal thickening. No mastoid or middle ear effusion. Normal orbits. IMPRESSION: 1. No acute intracranial abnormality. 2. Findings of mild chronic microvascular ischemia and generalized volume loss. Electronically Signed   By: Ulyses Jarred M.D.   On: 08/28/2021 01:04      Subjective:  No nausea, no vomiting, vertigo still present  but much improved. Discharge Exam: Vitals:   09/02/21 0700 09/02/21 0820  BP:  130/86  Pulse: 86 65  Resp: (!) 21 20  Temp:  97.6 F (36.4 C)  SpO2: (!) 88% 91%   Vitals:   09/02/21 0500 09/02/21 0600 09/02/21 0700 09/02/21 0820  BP:  (!) 137/92  130/86  Pulse: 62 66 86 65  Resp: 15 16 (!) 21 20  Temp:    97.6 F (36.4 C)  TempSrc:    Oral  SpO2: 91% 92% (!) 88% 91%  Weight:      Height:        General: Pt is alert, awake, not in acute distress Cardiovascular: RRR, S1/S2 +, no rubs, no gallops Respiratory: CTA bilaterally, no wheezing, no rhonchi Abdominal: Soft, NT, ND, bowel sounds + Extremities: no edema, no cyanosis    The results of significant diagnostics from this hospitalization (including imaging, microbiology, ancillary and laboratory) are listed below for reference.     Microbiology: Recent Results (from the past 240 hour(s))  Resp Panel by RT-PCR (Flu A&B, Covid) Nasopharyngeal Swab     Status: None   Collection Time: 08/28/21  3:56 AM   Specimen: Nasopharyngeal Swab; Nasopharyngeal(NP) swabs in vial transport medium  Result Value Ref Range Status   SARS Coronavirus 2 by RT PCR NEGATIVE NEGATIVE Final    Comment: (NOTE) SARS-CoV-2 target nucleic acids are NOT DETECTED.  The SARS-CoV-2 RNA is generally detectable in upper respiratory specimens  during the acute phase of infection. The lowest concentration of SARS-CoV-2 viral copies this assay can detect is 138 copies/mL. A negative result does not preclude SARS-Cov-2 infection and should not be used as the sole basis for treatment or other patient management decisions. A negative result may occur with  improper specimen collection/handling, submission of specimen other than nasopharyngeal swab, presence of viral mutation(s) within the areas targeted by this assay, and inadequate number of viral copies(<138 copies/mL). A negative result must be combined with clinical observations, patient history, and epidemiological information. The expected result is Negative.  Fact Sheet for Patients:  EntrepreneurPulse.com.au  Fact Sheet for Healthcare Providers:  IncredibleEmployment.be  This test is no t yet approved or cleared by the [place] FDA and  has been authorized for detection and/or diagnosis of SARS-CoV-2 by FDA under an Emergency Use Authorization (EUA). This EUA will remain  in effect (meaning this test can be used) for the duration of the COVID-19 declaration under Section 564(b)(1) of the Act, 21 U.S.C.section 360bbb-3(b)(1), unless the authorization is terminated  or revoked sooner.       Influenza A by PCR NEGATIVE NEGATIVE Final   Influenza B by PCR NEGATIVE NEGATIVE Final    Comment: (NOTE) The Xpert Xpress SARS-CoV-2/FLU/RSV plus assay is intended as an aid in the diagnosis of influenza from Nasopharyngeal swab specimens and should not be used as a sole basis for treatment. Nasal washings and aspirates are unacceptable for Xpert Xpress SARS-CoV-2/FLU/RSV testing.  Fact Sheet for Patients: EntrepreneurPulse.com.au  Fact Sheet for Healthcare Providers: IncredibleEmployment.be  This test is not yet approved or cleared by the [place] FDA and has been authorized for detection  and/or diagnosis of SARS-CoV-2 by FDA under an Emergency Use Authorization (EUA). This EUA will remain in effect (meaning this test can be used) for the duration of the COVID-19 declaration under Section 564(b)(1) of the Act, 21 U.S.C. section 360bbb-3(b)(1), unless the authorization is terminated or revoked.  Performed at [facility], [place] [address],  [place], [place] [address]      Labs: BNP (last 3 results) No results for input(s): BNP in the last 8760 hours. Basic Metabolic Panel: Recent Labs  Lab 08/28/21 2342 08/30/21 0237 08/31/21 0157 09/01/21 0128 09/02/21 0141  NA 138 140 138 137 135  K 4.1 3.8 4.3 4.3 4.8  CL 105 104 105 102 102  CO2 25 25 24 24 24   GLUCOSE 127* 124* 122* 153* 129*  BUN 20 23 25* 29* 29*  CREATININE 1.21 1.17 1.18 1.20 1.19  CALCIUM 8.9 8.8* 9.0 9.0 9.2  MG 2.3 2.3 2.0 2.0 2.3   Liver Function Tests: No results for input(s): AST, ALT, ALKPHOS, BILITOT, PROT, ALBUMIN in the last 168 hours. No results for input(s): LIPASE, AMYLASE in the last 168 hours. No results for input(s): AMMONIA in the last 168 hours. CBC: Recent Labs  Lab 08/27/21 2005 08/28/21 2342 08/30/21 0237 08/31/21 0157 09/01/21 0128 09/02/21 0141  WBC 9.6 11.7* 12.0* 11.7* 11.8* 11.1*  NEUTROABS 8.5*  --   --   --   --   --   HGB 14.3 14.2 14.9 15.0 15.4 15.5  HCT 43.5 44.5 45.5 46.6 46.5 46.1  MCV 87.7 87.1 86.3 87.6 86.6 86.5  PLT 209 185 195 255 284 265   Cardiac Enzymes: No results for input(s): CKTOTAL, CKMB, CKMBINDEX, TROPONINI in the last 168 hours. BNP: Invalid input(s): POCBNP CBG: No results for input(s): GLUCAP in the last 168 hours. D-Dimer No results for input(s): DDIMER in the last 72 hours. Hgb A1c No results for input(s): HGBA1C in the last 72 hours. Lipid Profile No results for input(s): CHOL, HDL, LDLCALC, TRIG, CHOLHDL, LDLDIRECT in the last 72 hours. Thyroid function studies No results for input(s): TSH, T4TOTAL, T3FREE, THYROIDAB in  the last 72 hours.  Invalid input(s): FREET3 Anemia work up No results for input(s): VITAMINB12, FOLATE, FERRITIN, TIBC, IRON, RETICCTPCT in the last 72 hours. Urinalysis    Component Value Date/Time   COLORURINE YELLOW 02/04/2017 1200   APPEARANCEUR CLEAR 02/04/2017 1200   LABSPEC 1.021 02/04/2017 1200   PHURINE 5.0 02/04/2017 1200   GLUCOSEU NEGATIVE 02/04/2017 1200   HGBUR NEGATIVE 02/04/2017 1200   BILIRUBINUR NEGATIVE 02/04/2017 1200   KETONESUR NEGATIVE 02/04/2017 1200   PROTEINUR NEGATIVE 02/04/2017 1200   NITRITE NEGATIVE 02/04/2017 1200   LEUKOCYTESUR NEGATIVE 02/04/2017 1200   Sepsis Labs Invalid input(s): PROCALCITONIN,  WBC,  LACTICIDVEN Microbiology Recent Results (from the past 240 hour(s))  Resp Panel by RT-PCR (Flu A&B, Covid) Nasopharyngeal Swab     Status: None   Collection Time: 08/28/21  3:56 AM   Specimen: Nasopharyngeal Swab; Nasopharyngeal(NP) swabs in vial transport medium  Result Value Ref Range Status   SARS Coronavirus 2 by RT PCR NEGATIVE NEGATIVE Final    Comment: (NOTE) SARS-CoV-2 target nucleic acids are NOT DETECTED.  The SARS-CoV-2 RNA is generally detectable in upper respiratory specimens during the acute phase of infection. The lowest concentration of SARS-CoV-2 viral copies this assay can detect is 138 copies/mL. A negative result does not preclude SARS-Cov-2 infection and should not be used as the sole basis for treatment or other patient management decisions. A negative result may occur with  improper specimen collection/handling, submission of specimen other than nasopharyngeal swab, presence of viral mutation(s) within the areas targeted by this assay, and inadequate number of viral copies(<138 copies/mL). A negative result must be combined with clinical observations, patient history, and epidemiological information. The expected result is Negative.  Fact Sheet for Patients:  EntrepreneurPulse.com.au  Fact Sheet  for Healthcare Providers:  IncredibleEmployment.be  This test is no t yet approved or cleared by the [place] FDA and  has been authorized for detection and/or diagnosis of SARS-CoV-2 by FDA under an Emergency Use Authorization (EUA). This EUA will remain  in effect (meaning this test can be used) for the duration of the COVID-19 declaration under Section 564(b)(1) of the Act, 21 U.S.C.section 360bbb-3(b)(1), unless the authorization is terminated  or revoked sooner.       Influenza A by PCR NEGATIVE NEGATIVE Final   Influenza B by PCR NEGATIVE NEGATIVE Final    Comment: (NOTE) The Xpert Xpress SARS-CoV-2/FLU/RSV plus assay is intended as an aid in the diagnosis of influenza from Nasopharyngeal swab specimens and should not be used as a sole basis for treatment. Nasal washings and aspirates are unacceptable for Xpert Xpress SARS-CoV-2/FLU/RSV testing.  Fact Sheet for Patients: EntrepreneurPulse.com.au  Fact Sheet for Healthcare Providers: IncredibleEmployment.be  This test is not yet approved or cleared by the [place] FDA and has been authorized for detection and/or diagnosis of SARS-CoV-2 by FDA under an Emergency Use Authorization (EUA). This EUA will remain in effect (meaning this test can be used) for the duration of the COVID-19 declaration under Section 564(b)(1) of the Act, 21 U.S.C. section 360bbb-3(b)(1), unless the authorization is terminated or revoked.  Performed at [facility], [place] [address] [place], [place] [address]      Time coordinating discharge: 30 minutes  SIGNED:   Phillips Climes, MD  [facility] 09/02/2021, 11:07 AM Pager   If 7PM-7AM, please contact night-coverage [email or web address] Password [email or web address]

## 2021-09-02 NOTE — Discharge Instructions (Signed)
Follow with Primary MD Lavone Orn, MD in 7 days   Get CBC, CMP, checked  by Primary MD next visit.    Activity: As tolerated with Full fall precautions use walker/cane & assistance as needed   Disposition Home    Diet: regular diet   On your next visit with your primary care physician please Get Medicines reviewed and adjusted.   Please request your Prim.MD to go over all Hospital Tests and Procedure/Radiological results at the follow up, please get all Hospital records sent to your Prim MD by signing hospital release before you go home.   If you experience  worsening of your admission symptoms, develop shortness of breath, life threatening emergency, suicidal or homicidal thoughts you must seek medical attention immediately by calling 911 or calling your MD immediately  if symptoms less severe.  You Must read complete instructions/literature along with all the possible adverse reactions/side effects for all the Medicines you take and that have been prescribed to you. Take any new Medicines after you have completely understood and accpet all the possible adverse reactions/side effects.   Do not drive, operating heavy machinery, perform activities at heights, swimming or participation in water activities or provide baby sitting services if your were admitted for syncope or siezures until you have seen by Primary MD or a Neurologist and advised to do so again.  Do not drive when taking Pain medications.    Do not take more than prescribed Pain, Sleep and Anxiety Medications  Special Instructions: If you have smoked or chewed Tobacco  in the last 2 yrs please stop smoking, stop any regular Alcohol  and or any Recreational drug use.  Wear Seat belts while driving.   Please note  You were cared for by a hospitalist during your hospital stay. If you have any questions about your discharge medications or the care you received while you were in the hospital after you are discharged,  you can call the unit and asked to speak with the hospitalist on call if the hospitalist that took care of you is not available. Once you are discharged, your primary care physician will handle any further medical issues. Please note that NO REFILLS for any discharge medications will be authorized once you are discharged, as it is imperative that you return to your primary care physician (or establish a relationship with a primary care physician if you do not have one) for your aftercare needs so that they can reassess your need for medications and monitor your lab values.

## 2021-09-02 NOTE — Progress Notes (Signed)
ANTICOAGULATION CONSULT NOTE - Follow-up Consult  Pharmacy Consult for Coumadin Indication:  Afib and h/o recurrent/chronic VTE w/ factor V Leiden  Allergies  Allergen Reactions   Niacin And Related     Hepatitis   Phisohex [Hexachlorophene]     rash   Topamax [Topiramate]     suicidal   Vital Signs: Temp: 97.6 F (36.4 C) (12/29 0820) Temp Source: Oral (12/29 0820) BP: 130/86 (12/29 0820) Pulse Rate: 65 (12/29 0820)  Labs: Recent Labs    08/31/21 0157 09/01/21 0128 09/02/21 0141  HGB 15.0 15.4 15.5  HCT 46.6 46.5 46.1  PLT 255 284 265  LABPROT 33.4* 29.5* 23.5*  INR 3.3* 2.8* 2.1*  CREATININE 1.18 1.20 1.19     Medical History: Past Medical History:  Diagnosis Date   Ankle impingement syndrome    Right ankle from bone spur   Antiphospholipid antibody syndrome (HCC) 06/12/2012   Coagulopathy ([place]) 06/12/2012   DVT (deep venous thrombosis) ([place]) 1990, 2002   chronic hypercoagulobility.    DVT, recurrent, lower extremity, chronic 06/12/2012   1992   Dysrhythmia    AF   GERD (gastroesophageal reflux disease)    H/O tinnitus    Heterozygous factor V Leiden mutation ([place]) 06/12/2012   Hyperlipidemia    Migraine headache    Protein S deficiency ([place]) 06/12/2012   Prothrombin gene mutation ([place]) 06/12/2012   Pulmonary embolism ([place])    Pulmonary embolism, bilateral ([place]) 06/12/2012   July, 2002   PVC (premature ventricular contraction)    Varicose veins 06/12/2012   RLE   Wears hearing aid     Assessment: 70yo male c/o sudden loss of hearing in L ear and severe vertigo, to continue Coumadin for Afib and h/o VTE/factor V Leiden. Patient took warfarin 12/23 before admission. Last dose after admission is 12/25. PTA regimen: warfarin 5 mg daily.   After being supratherapeutic, INR was therapeutic at 2.8 s/p 2 held doses. INR today was 2.1 and CBC stable. No signs of bleeding noted. Although there was an increase in INR with the home regimen, s/p two doses held, INR  dropped to low-therapeutic. Will give another 4 mg dose today given he was previously supratherapeutic on his home regimen and adjust based on INR.  Goal of Therapy:  INR 2-3   Plan:  Give warfarin 4 mg today Check INR with AM labs tomorrow   Thank you for allowing pharmacy to participate in this patient's care.  Varney Daily, PharmD PGY1 Pharmacy Resident  Please check AMION for all [facility] pharmacy phone numbers After 10:00 PM call main pharmacy [phone]

## 2021-09-02 NOTE — Plan of Care (Signed)

## 2021-09-02 NOTE — Progress Notes (Signed)
°   09/02/21 1100  Vitals  Temp 97.6 F (36.4 C)  Temp Source Temporal  BP (!) 142/91  MAP (mmHg) 107  BP Location Right Arm  BP Method Automatic  Patient Position (if appropriate) Lying  Level of Consciousness  Level of Consciousness Alert  MEWS COLOR  MEWS Score Color Green  Oxygen Therapy  O2 Device Room Air  Pain Assessment  Pain Scale 0-10  Pain Score 0  MEWS Score  MEWS Temp 0  MEWS Systolic 0  MEWS Pulse 0  MEWS RR 0  MEWS LOC 0  MEWS Score 0    RN, Nursing tech and patient family at bedside.Patient appears calm and free of pain. Patient telemonitoring connections removed; IV access removed; All questioned by patient and family answered by RN. Patient educated on discharged. Agricultural consultant notified.

## 2021-09-03 ENCOUNTER — Ambulatory Visit: Payer: 59 | Attending: Internal Medicine | Admitting: Rehabilitative and Restorative Service Providers"

## 2021-09-03 DIAGNOSIS — R2689 Other abnormalities of gait and mobility: Secondary | ICD-10-CM | POA: Diagnosis present

## 2021-09-03 DIAGNOSIS — R2681 Unsteadiness on feet: Secondary | ICD-10-CM | POA: Diagnosis present

## 2021-09-03 DIAGNOSIS — R42 Dizziness and giddiness: Secondary | ICD-10-CM | POA: Insufficient documentation

## 2021-09-03 NOTE — Patient Instructions (Signed)
Access Code: [email or web address] URL: [email or web address] Date: 09/03/2021 Prepared by: Rudell Cobb  Program Notes RULES FOR EXERCISE:  Don't let symptoms go above a 5/10 and if symptoms last > 10 minutes after exercises, do less reps.   Exercises Seated Gaze Stabilization with Head Rotation - 2-3 x daily - 7 x weekly - 1 sets - 5-10 reps Vestibular Habituation - Seated Horizontal Head Rotation - 2-3 x daily - 7 x weekly - 1 sets - 5 reps Sit to Stand with Counter Support - 2-3 x daily - 7 x weekly - 1 sets - 5 reps

## 2021-09-03 NOTE — Therapy (Signed)
[facility] [address], [place], [place], [address] Phone: [phone]   Fax:  [phone]  Physical Therapy Evaluation  Patient Details  Name: Saint Hank MRN: [id number] Date of Birth: [birth year] Referring Provider (PT): Gerlean Ren MD is hospitalist/ Dr. Marlou Porch MD is primary care   Encounter Date: 09/03/2021   PT End of Session - 09/03/21 1219     Visit Number 1    Number of Visits 17    Date for PT Re-Evaluation 11/02/21    Authorization Type UHC Medicare    Progress Note Due on Visit 10    PT Start Time 1103    PT Stop Time 1150    PT Time Calculation (min) 47 min    Equipment Utilized During Treatment Gait belt    Activity Tolerance Patient tolerated treatment well    Behavior During Therapy [facility] for tasks assessed/performed             Past Medical History:  Diagnosis Date   Ankle impingement syndrome    Right ankle from bone spur   Antiphospholipid antibody syndrome ([place]) 06/12/2012   Coagulopathy ([place]) 06/12/2012   DVT (deep venous thrombosis) ([place]) 1990, 2002   chronic hypercoagulobility.    DVT, recurrent, lower extremity, chronic 06/12/2012   1992   Dysrhythmia    AF   GERD (gastroesophageal reflux disease)    H/O tinnitus    Heterozygous factor V Leiden mutation ([place]) 06/12/2012   Hyperlipidemia    Migraine headache    Protein S deficiency ([place]) 06/12/2012   Prothrombin gene mutation ([place]) 06/12/2012   Pulmonary embolism ([place])    Pulmonary embolism, bilateral ([place]) 06/12/2012   July, 2002   PVC (premature ventricular contraction)    Varicose veins 06/12/2012   RLE   Wears hearing aid     Past Surgical History:  Procedure Laterality Date   ABDOMINAL AORTIC ANEURYSM REPAIR  12/14   [facility]   ANKLE SURGERY  2005   right   APPENDECTOMY     ARTERY BIOPSY Left 08/12/2014   Procedure: LEFT TEMPORAL ARTERY BIOPSY ;  Surgeon: Coralie Keens, MD;  Location: [place] [facility];  Service:  General;  Laterality: Left;   CHOLECYSTECTOMY  2008   lap choli   COLONOSCOPY N/A 09/03/2013   Procedure: COLONOSCOPY;  Surgeon: Garlan Fair, MD;  Location: [facility];  Service: Endoscopy;  Laterality: N/A;   FINGER SURGERY     Right 1st finger,  by Dr. Kalman Shan TENDON REPAIR  2013   left 5th finger by Dr. Caralyn Guile   INCISION AND DRAINAGE HIP Left 02/17/2021   Procedure: IRRIGATION AND DEBRIDEMENT LEFT THIGH;  Surgeon: Dorna Leitz, MD;  Location: [facility];  Service: Orthopedics;  Laterality: Left;   PATELLA RELEASE AND MANIPULATION  1992   Right [place]    There were no vitals filed for this visit.    Subjective Assessment - 09/03/21 1108     Subjective The patient had sudden loss of hearing L ear upon waking 12/23.  He had an inner ear injection and developed severe dizziness, nausea/vomitting and was admitted t the hospital.    He is using valium regularly.  He hasn't taken valium since leaving the hospital yesterday.  Patient is a runner, lifter.  He is on warfarin due to a clotting disorder.    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    Currently in Pain? No/denies                [facility] PT Assessment - 09/03/21 1113       Assessment   Medical Diagnosis R42 vertigo    Referring Provider (PT) Gerlean Ren MD is hospitalist/ Dr. Marlou Porch MD is primary care    Onset Date/Surgical Date 08/27/21    Hand Dominance Right      Precautions   Precautions Fall    Precaution Comments hearing impaired bilateral (speak to him on R side for now)      Restrictions   Weight Bearing Restrictions No      Balance Screen   Has the patient fallen in the past 6 months No    Has the patient had a decrease in activity level because of a  fear of falling?  Yes    Is the patient reluctant to leave their home because of a fear of falling?  Yes      [place] residence    Living Arrangements Spouse/significant other    Home Access Stairs to enter    Entrance Stairs-Number of Steps 4    Entrance Stairs-Rails --   sensation of falling when going downstairs   [place] to live on main level with bedroom/bathroom;Two level    [place] - 2 wheels;Shower seat      Prior Function   Level of Independence Independent    Vocation --   The patient retired from first career and returned to [employer] and was working with [employer] prior to onset.   Vocation Requirements driving, walking, engaging with church members    Leisure runs-- was training for 1/2 marathon, lifts weights      Observation/Other Assessments   Focus on Therapeutic Outcomes (FOTO)  n/a      Bed Mobility   Bed Mobility --   Patient reports falling posteriorly sensation when getting into bed, easier to get out of bed.  *PT still to assess     Transfers   Transfers Sit to Stand;Stand to Sit    Sit to Stand 4: Min assist    Sit to Stand Details Verbal cues for technique;Manual facilitation for weight shifting    Sit to Stand Details (indicate cue type and reason) The patient is avoiding anterior translation of weight to initiate sit>stand due to dizziness.  PT used letter on walker as spotting point and encouraged trunk lean to help initiate standing and then spot an object as he rises.    Stand to Sit 4: Min assist    Stand to Sit Details Patient sits heavily and we discussed more controlled lowering.      Ambulation/Gait   Ambulation/Gait Yes    Ambulation/Gait Assistance 4: Min assist;4: Min guard    Ambulation/Gait Assistance Details The patient required CGA to minA (improved t/o session)    Ambulation Distance (Feet) 100 Feet    Assistive device Rolling walker    Gait Pattern Decreased stride  length;Decreased arm swing - right;Decreased arm swing - left;Decreased step length - right;Decreased step length - left;Shuffle    Ambulation Surface Level;Indoor    Gait velocity 0.87 ft/sec indicating high fall risk and significant impairment    Stairs No   to  be assessed.     Balance   Balance Assessed Yes      Static Sitting Balance   Static Sitting - Balance Support No upper extremity supported;Feet supported   has L head tilt initially and slumped posture; able to sit upright and this did help right head to midline   Static Sitting - Level of Assistance 7: Independent      Dynamic Sitting Balance   Dynamic Sitting - Balance Support Bilateral upper extremity supported   with any head motion patient holds edge of mat tightly   Dynamic Sitting - Level of Assistance 5: Stand by assistance;4: Min assist    Dynamic Sitting Balance - Compensations --   UE support     Static Standing Balance   Static Standing - Balance Support Bilateral upper extremity supported   through walker   Static Standing - Level of Assistance 4: Min assist      High Level Balance   High Level Balance Comments Not able to tolerate any unsupported standing at this time                    Vestibular Assessment - 09/03/21 1118       Vestibular Assessment   General Observation Has a sensation of waviness now instead of "violent" spinning.  He has sensorineural hearing loss that impacted R ear 10-12 years ago and uses hearing aid.    3/10 BASELINE DIZZINESS/DISORIENTATION  RESTING POSTURE:  L head tilt (ocular tilt reaction)      Symptom Behavior   Subjective history of current problem Sudden onset of spinning, nausea/vomitting, imbalance s/p dex injection L tympanic membrane    Type of Dizziness  Spinning;Imbalance;Oscillopsia;Unsteady with head/body turns;"World moves"    Frequency of Dizziness daily, constant    Duration of Dizziness constant    Symptom Nature Constant;Positional;Motion provoked     Aggravating Factors Activity in general;Looking up to the ceiling;Lying supine;Turning body quickly;Turning head quickly;Supine to sit;Sit to stand;Rolling to right;Rolling to left;Forward bending    Relieving Factors Head stationary   does not completely resolve   Progression of Symptoms Better   since onset   History of similar episodes h/o R sudden onset hearing loss 10-12 years ago.  ???Uncertain if bilateral vestibular loss due to head impulse testing results.  Plan to retest head impulse test.      Oculomotor Exam   Oculomotor Alignment Abnormal   R eye hypertropia   Ocular ROM WFLs    Spontaneous Absent    Gaze-induced  Absent    Smooth Pursuits Intact    Saccades Intact      Vestibulo-Ocular Reflex   VOR 1 Head Only (x 1 viewing) slow VOR provokes dizziness 4/10    VOR Cancellation Comment   unable to tolerate >1 rep (no saccadic pursuit noted) due to nausea   Comment head impulse test=negative for observable corrective saccade however reports visual blurring;  provokes 5/10 symptoms      Positional Sensitivities   Head Turning x 5 Moderate dizziness   3 reps   Head Nodding x 5 Severe dizziness   vertical motion worse needs supervision due ot imbalance 3 reps   Positional Sensitivities Comments sit to stand provokes moderate dizziness and requires min A                Objective measurements completed on examination: See above findings.       [facility] Adult PT Treatment/Exercise - 09/03/21 1113  Neuro Re-ed    Neuro Re-ed Details  Education in sit<>stand and using visual compensatory strategies for spotting.             Vestibular Treatment/Exercise - 09/03/21 1213       Vestibular Treatment/Exercise   Vestibular Treatment Provided Habituation;Gaze    Habituation Exercises Seated Horizontal Head Turns;Seated Vertical Head Turns    Gaze Exercises X1 Viewing Horizontal;X1 Viewing Vertical      Seated Horizontal Head Turns   Number of Reps  3     Symptom Description  recommended for home with chair with a back for support      Seated Vertical Head Turns   Number of Reps  3    Symptom Description  unable to tolerate for home/ loses his balance in sitting with head motion      X1 Viewing Horizontal   Foot Position seated    Comments x 1 viewing at slow speed provoking mild symptoms (discussed not going above 5/10 for HEP)      X1 Viewing Vertical   Foot Position seated    Comments Not able to tolerate for home-- provokes imbalance in sitting.                    PT Education - 09/03/21 1219     Education Details HEP; nature of vestibular labyrinthitis/vestibular loss    Person(s) Educated Patient;Spouse    Methods Explanation;Demonstration;Handout    Comprehension Verbalized understanding;Returned demonstration              PT Short Term Goals - 09/03/21 1220       PT SHORT TERM GOAL #1   Title The patient will be indep with HEP for gaze adaptation, habituation, balance, and general mobility.    Time 4    Period Weeks    Target Date 10/03/21      PT SHORT TERM GOAL #2   Title The patient will be able to move sit<>stand 5 reps without UE use, and without use of RW mod indep.    Time 4    Period Weeks    Target Date 10/03/21      PT SHORT TERM GOAL #3   Title The patient will ambulate mod indep with RW in home and community x 300 ft without loss of balance.    Time 4    Period Weeks    Target Date 10/03/21      PT SHORT TERM GOAL #4   Title The patient will tolerate gaze adaptation x 1 viewing x 30 seconds in seated or standing position.    Time 4    Period Weeks    Target Date 10/03/21      PT SHORT TERM GOAL #5   Title The patient will demonstrate bed mobility with dizziness < or equal to 2/10.    Baseline reports falling posteriorly with sit>supine.    Time 4    Period Weeks    Target Date 10/03/21      Additional Short Term Goals   Additional Short Term Goals Yes      PT SHORT TERM  GOAL #6   Title PT to further assess Berg and/or FGA when appropriate.    Time 4    Period Weeks    Target Date 10/03/21               PT Long Term Goals - 09/03/21 1221       PT LONG TERM GOAL #1  Title The patient will return to modified gym routine.    Time 8    Period Weeks    Target Date 11/02/21      PT LONG TERM GOAL #2   Title The patient will ambulate without an assistive device for household and community distances independently >500 ft.    Time 8    Period Weeks    Target Date 11/02/21      PT LONG TERM GOAL #3   Title The patient will tolerate gaze x 1 adaptation x 60 seconds without c/o dizziness.    Time 8    Period Weeks    Target Date 11/02/21      PT LONG TERM GOAL #4   Title The patient will improve Berg balance testing by 8 points.    Time 8    Period Weeks    Target Date 11/02/21      PT LONG TERM GOAL #5   Title The patient will improve gait speed to > or equal to 2.7 ft/sec demonstrating dec'd fall risk and return to full community ambulator classification of gait.    Time 8    Period Weeks    Target Date 11/02/21                    Plan - 09/03/21 1225     Clinical Impression Statement The patient is a 70 yo male presenting to OP physical therapy s/p sudden onset of hearing loss and dizziness 08/27/21 with hospital admission (d/c 09/02/21).  He also has h/o clotting disorder and R sensorineural hearing loss.  At today's evaluation, he presents with impairments in gaze adaptation, severe motion sensitivity, and balance leading to functional impairments of needing assist for all transfers and ambulation in home/community.  Patient is at a high risk to fall per gait speed.  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  PT to address deficits working to get vestibular adaptation/compensation, improve balance, and work to return to prior  functional status.    Personal Factors and Comorbidities Comorbidity 2    Comorbidities prior h/o R sensorineural hearing loss (unsure if vestibular nerve impairment), h/o clotting disorder, L hearing absent    Examination-Activity Limitations Bathing;Bed Mobility;Bend;Dressing;Stand;Stairs;Locomotion Level;Reach Overhead;Sit;Squat;Transfers    Examination-Participation Restrictions Church;Meal Prep;Occupation;Cleaning;Community Activity;Driving;Interpersonal Relationship    Stability/Clinical Decision Making Evolving/Moderate complexity    Clinical Decision Making Moderate   due to level of severity, fall risk, and limitations   Rehab Potential Good    PT Frequency 2x / week   + eval   PT Duration 8 weeks    PT Treatment/Interventions ADLs/Self Care Home Management;Patient/family education;Neuromuscular re-education;Balance training;Therapeutic exercise;Therapeutic activities;Functional mobility training;Gait training;Stair training;Manual techniques;Vestibular    PT Next Visit Plan Check HEP and try to progress to supported sitting with vertical head motion, progress gaze reps to tolerance, standing balance in corner.  Assess Berg, bed mobility, and stairs.  Give pointers to improve safety with functional tasks and work on balance/functional mobility.    Consulted and Agree with Plan of Care Patient;Family member/caregiver    Family Member Consulted spouse             Patient will benefit from skilled therapeutic intervention in order to improve the following deficits and impairments:  Dizziness, Abnormal gait, Difficulty walking, Decreased activity tolerance, Decreased balance, Decreased mobility, Postural dysfunction, Impaired vision/preception  Visit Diagnosis: Dizziness and giddiness  Other abnormalities  of gait and mobility  Unsteadiness on feet     Problem List Patient Active Problem List   Diagnosis Date Noted   Vertigo 08/28/2021   PAF (paroxysmal atrial fibrillation)  ([place]) 08/28/2021   Hematoma of left thigh 02/17/2021   Elevated LFTs    Acute epigastric pain 02/04/2017   HTN (hypertension) 09/27/2013   SNHL (sensorineural hearing loss) 05/24/2013   CAD (coronary artery disease) 04/18/2013   Chronic anticoagulation 04/18/2013   GERD (gastroesophageal reflux disease) 04/18/2013   History of pulmonary embolism 04/18/2013   Blood clotting disorder ([place]) 06/12/2012   Heterozygous factor V Leiden mutation ([place]) 06/12/2012   Prothrombin gene mutation ([place]) 06/12/2012   Antiphospholipid antibody syndrome ([place]) 06/12/2012   Protein S deficiency ([place]) 06/12/2012   DVT, recurrent, lower extremity, chronic 06/12/2012   Pulmonary embolism, bilateral ([place]) 06/12/2012   Varicose veins 06/12/2012   Chronic venous embolism and thrombosis of deep vessels of lower extremity ([place]) 06/12/2012   Primary hypercoagulable state ([place]) 06/12/2012   Aneurysm of abdominal vessel 03/12/2012   Dissection of aorta, abdominal ([place]) 03/12/2012   ABNORMALITY OF GAIT 07/02/2009   SPRAIN&STRAIN OTHER SPECIFIED SITES KNEE&LEG 07/02/2009    Luvena Wentling, PT 09/03/2021, 12:35 PM  [facility] [address] [place], [place], [address] Phone: [phone]   Fax:  [phone]  Name: Shahiem Bedwell MRN: [id number] Date of Birth: 07-27-1951

## 2021-09-08 ENCOUNTER — Encounter: Payer: Self-pay | Admitting: Physical Therapy

## 2021-09-08 ENCOUNTER — Other Ambulatory Visit: Payer: Self-pay

## 2021-09-08 ENCOUNTER — Ambulatory Visit: Payer: Medicare Other | Attending: Internal Medicine | Admitting: Physical Therapy

## 2021-09-08 DIAGNOSIS — R2681 Unsteadiness on feet: Secondary | ICD-10-CM | POA: Insufficient documentation

## 2021-09-08 DIAGNOSIS — R2689 Other abnormalities of gait and mobility: Secondary | ICD-10-CM | POA: Diagnosis present

## 2021-09-08 DIAGNOSIS — R42 Dizziness and giddiness: Secondary | ICD-10-CM | POA: Diagnosis not present

## 2021-09-08 NOTE — Therapy (Signed)
[facility] [address] [place], [place], [address] Phone: [phone]   Fax:  [phone]  Physical Therapy Treatment  Patient Details  Name: Don Ruiz MRN: [id number] Date of Birth: August 01, 1951 Referring Provider (PT): Gerlean Ren MD is hospitalist/ Dr. Marlou Porch MD is primary care   Encounter Date: 09/08/2021   PT End of Session - 09/08/21 1446     Visit Number 2    Number of Visits 17    Date for PT Re-Evaluation 11/02/21    Authorization Type UHC Medicare    Progress Note Due on Visit 10    PT Start Time 1400    PT Stop Time 1441    PT Time Calculation (min) 41 min    Equipment Utilized During Treatment Gait belt    Activity Tolerance Patient tolerated treatment well;Other (comment)   dizziness and nausea   Behavior During Therapy [facility] for tasks assessed/performed             Past Medical History:  Diagnosis Date   Ankle impingement syndrome    Right ankle from bone spur   Antiphospholipid antibody syndrome ([place]) 06/12/2012   Coagulopathy ([place]) 06/12/2012   DVT (deep venous thrombosis) ([place]) 1990, 2002   chronic hypercoagulobility.    DVT, recurrent, lower extremity, chronic 06/12/2012   1992   Dysrhythmia    AF   GERD (gastroesophageal reflux disease)    H/O tinnitus    Heterozygous factor V Leiden mutation ([place]) 06/12/2012   Hyperlipidemia    Migraine headache    Protein S deficiency ([place]) 06/12/2012   Prothrombin gene mutation ([place]) 06/12/2012   Pulmonary embolism ([place])    Pulmonary embolism, bilateral ([place]) 06/12/2012   July, 2002   PVC (premature ventricular contraction)    Varicose veins 06/12/2012   RLE   Wears hearing aid     Past Surgical History:  Procedure Laterality Date   ABDOMINAL AORTIC ANEURYSM REPAIR  12/14   [facility]   ANKLE SURGERY  2005   right   APPENDECTOMY     ARTERY BIOPSY Left 08/12/2014   Procedure: LEFT TEMPORAL ARTERY BIOPSY ;  Surgeon: Coralie Keens, MD;  Location: [facility];  Service: General;  Laterality: Left;   CHOLECYSTECTOMY  2008   lap choli   COLONOSCOPY N/A 09/03/2013   Procedure: COLONOSCOPY;  Surgeon: Garlan Fair, MD;  Location: [facility];  Service: Endoscopy;  Laterality: N/A;   FINGER SURGERY     Right 1st finger,  by Dr. Kalman Shan TENDON REPAIR  2013   left 5th finger by Dr. Caralyn Guile   INCISION AND DRAINAGE HIP Left 02/17/2021   Procedure: IRRIGATION AND DEBRIDEMENT LEFT THIGH;  Surgeon: Dorna Leitz, MD;  Location: [facility];  Service: Orthopedics;  Laterality: Left;   PATELLA RELEASE AND MANIPULATION  1992   Right Patella    There were no vitals filed for this visit.   Subjective Assessment - 09/08/21 1358     Subjective Still having trouble hearing. TOlerated exercises reasonably well. dizziness worse with movement, settles at rest.    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    Currently in Pain? No/denies  Vestibular Treatment/Exercise - 09/08/21 0001       Vestibular Treatment/Exercise   Habituation Exercises Seated Horizontal Head Turns    Gaze Exercises Eye/Head Exercise Horizontal      Seated Vertical Head Turns   Number of Reps  5    Symptom Description  to targets; c/o mild dizziness      X1 Viewing Horizontal   Foot Position seated    Reps 10    Comments 0/10 dizziness at slow speed; limited gaze fixation with L head turn; able to increase speed with c/oi mild dizziness      X1 Viewing Vertical   Foot Position seated    Reps --   2x5   Comments 3-4/10 dizziness      Eye/Head Exercise Horizontal   Foot Position sitting    Reps 10    Comments VOR cancellation                Balance  Exercises - 09/08/21 0001       Balance Exercises: Standing   Standing Eyes Opened Wide (BOA);Narrow base of support (BOS);2 reps;30 secs   no UE support   Turning Right;Left;Limitations    Turning Limitations 1/4 turns to targets   CGA; 1 episode of imbalance requiring min A   Sit to Stand Standard surface;Limitations   pushing up from hands 5x, no UE suppoer 5x   Sit to Stand Limitations 2x5; c/o wooziness    Other Standing Exercises Comments R/L fwd/back stepping without UE supprt; increasing step length with subsequent reps                PT Education - 09/08/21 1445     Education Details update to HEP-Access Code: [email or web address]; edu on safe performance of HEP and intended level of dizziness    Person(s) Educated Patient;Spouse    Methods Demonstration;Explanation;Tactile cues;Verbal cues;Handout    Comprehension Verbalized understanding;Returned demonstration              PT Short Term Goals - 09/03/21 1220       PT SHORT TERM GOAL #1   Title The patient will be indep with HEP for gaze adaptation, habituation, balance, and general mobility.    Time 4    Period Weeks    Target Date 10/03/21      PT SHORT TERM GOAL #2   Title The patient will be able to move sit<>stand 5 reps without UE use, and without use of RW mod indep.    Time 4    Period Weeks    Target Date 10/03/21      PT SHORT TERM GOAL #3   Title The patient will ambulate mod indep with RW in home and community x 300 ft without loss of balance.    Time 4    Period Weeks    Target Date 10/03/21      PT SHORT TERM GOAL #4   Title The patient will tolerate gaze adaptation x 1 viewing x 30 seconds in seated or standing position.    Time 4    Period Weeks    Target Date 10/03/21      PT SHORT TERM GOAL #5   Title The patient will demonstrate bed mobility with dizziness < or equal to 2/10.    Baseline reports falling posteriorly with sit>supine.    Time 4    Period Weeks    Target Date 10/03/21       Additional Short Term Goals   Additional Short  Term Goals Yes      PT SHORT TERM GOAL #6   Title PT to further assess Berg and/or FGA when appropriate.    Time 4    Period Weeks    Target Date 10/03/21               PT Long Term Goals - 09/03/21 1221       PT LONG TERM GOAL #1   Title The patient will return to modified gym routine.    Time 8    Period Weeks    Target Date 11/02/21      PT LONG TERM GOAL #2   Title The patient will ambulate without an assistive device for household and community distances independently >500 ft.    Time 8    Period Weeks    Target Date 11/02/21      PT LONG TERM GOAL #3   Title The patient will tolerate gaze x 1 adaptation x 60 seconds without c/o dizziness.    Time 8    Period Weeks    Target Date 11/02/21      PT LONG TERM GOAL #4   Title The patient will improve Berg balance testing by 8 points.    Time 8    Period Weeks    Target Date 11/02/21      PT LONG TERM GOAL #5   Title The patient will improve gait speed to > or equal to 2.7 ft/sec demonstrating dec'd fall risk and return to full community ambulator classification of gait.    Time 8    Period Weeks    Target Date 11/02/21                   Plan - 09/08/21 1446     Clinical Impression Statement Patient arrived to session with wife with report of continued trouble with hearing. Notes continued dizziness with movement which settles at rest. Reviewed HEP while monitoring patients symptoms. Patient with limited gaze fixation with L head turn during VOR, however tolerated this well and able to perform at quicker speed when prompted. Able to tolerate vertical VOR today with mild-moderate dizziness. Excellent improvement in tolerance for STS transfers, today without UE support. Worked on progressing VOR activities and initiated standing exercises with intermittent UE support.  Able to perform dynamic balance activities, however reported significant increase in  dizziness upon turning, requiring sitting rest break. Updated HEP with exercises that were performed today and educated patient and wife on safe performance of these exercises and intended level of symptoms. Both reported understanding and without complaints upon leaving.    Comorbidities prior h/o R sensorineural hearing loss (unsure if vestibular nerve impairment), h/o clotting disorder, L hearing absent    PT Treatment/Interventions ADLs/Self Care Home Management;Patient/family education;Neuromuscular re-education;Balance training;Therapeutic exercise;Therapeutic activities;Functional mobility training;Gait training;Stair training;Manual techniques;Vestibular    PT Next Visit Plan Check HEP and try to progress to supported sitting with vertical head motion, progress gaze reps to tolerance, standing balance in corner.  Assess Berg, bed mobility, and stairs.  Give pointers to improve safety with functional tasks and work on balance/functional mobility.    Consulted and Agree with Plan of Care Patient;Family member/caregiver    Family Member Consulted spouse             Patient will benefit from skilled therapeutic intervention in order to improve the following deficits and impairments:  Dizziness, Abnormal gait, Difficulty walking, Decreased activity tolerance, Decreased balance, Decreased mobility, Postural dysfunction,  Impaired vision/preception  Visit Diagnosis: Dizziness and giddiness  Other abnormalities of gait and mobility  Unsteadiness on feet     Problem List Patient Active Problem List   Diagnosis Date Noted   Vertigo 08/28/2021   PAF (paroxysmal atrial fibrillation) ([place]) 08/28/2021   Hematoma of left thigh 02/17/2021   Elevated LFTs    Acute epigastric pain 02/04/2017   HTN (hypertension) 09/27/2013   SNHL (sensorineural hearing loss) 05/24/2013   CAD (coronary artery disease) 04/18/2013   Chronic anticoagulation 04/18/2013   GERD (gastroesophageal reflux disease)  04/18/2013   History of pulmonary embolism 04/18/2013   Blood clotting disorder ([place]) 06/12/2012   Heterozygous factor V Leiden mutation ([place]) 06/12/2012   Prothrombin gene mutation ([place]) 06/12/2012   Antiphospholipid antibody syndrome ([place]) 06/12/2012   Protein S deficiency ([place]) 06/12/2012   DVT, recurrent, lower extremity, chronic 06/12/2012   Pulmonary embolism, bilateral ([place]) 06/12/2012   Varicose veins 06/12/2012   Chronic venous embolism and thrombosis of deep vessels of lower extremity ([place]) 06/12/2012   Primary hypercoagulable state ([place]) 06/12/2012   Aneurysm of abdominal vessel 03/12/2012   Dissection of aorta, abdominal ([place]) 03/12/2012   ABNORMALITY OF GAIT 07/02/2009   SPRAIN&STRAIN OTHER SPECIFIED SITES KNEE&LEG 07/02/2009    Janene Harvey, PT, DPT 09/08/21 2:48 PM   [facility] [address], [place], [place], [address] Phone: [phone]   Fax:  [phone]  Name: Don Ruiz MRN: [id number] Date of Birth: 02/19/1951

## 2021-09-10 ENCOUNTER — Other Ambulatory Visit: Payer: Self-pay

## 2021-09-10 ENCOUNTER — Ambulatory Visit: Payer: Medicare Other | Admitting: Physical Therapy

## 2021-09-10 ENCOUNTER — Encounter: Payer: Self-pay | Admitting: Physical Therapy

## 2021-09-10 DIAGNOSIS — R2681 Unsteadiness on feet: Secondary | ICD-10-CM

## 2021-09-10 DIAGNOSIS — R2689 Other abnormalities of gait and mobility: Secondary | ICD-10-CM

## 2021-09-10 DIAGNOSIS — R42 Dizziness and giddiness: Secondary | ICD-10-CM | POA: Diagnosis not present

## 2021-09-10 NOTE — Therapy (Signed)
[facility] [address], [place], [place], [address] Phone: [phone]   Fax:  [phone]  Physical Therapy Treatment  Patient Details  Name: Don Ruiz MRN: [id number] Date of Birth: 13-Nov-1950 Referring Provider (PT): Gerlean Ren MD is hospitalist/ Dr. Marlou Porch MD is primary care   Encounter Date: 09/10/2021   PT End of Session - 09/10/21 1156     Visit Number 3    Number of Visits 17    Date for PT Re-Evaluation 11/02/21    Authorization Type UHC Medicare    Progress Note Due on Visit 10    PT Start Time 1104    PT Stop Time 1146    PT Time Calculation (min) 42 min    Equipment Utilized During Treatment Gait belt    Activity Tolerance Patient tolerated treatment well    Behavior During Therapy [facility] for tasks assessed/performed             Past Medical History:  Diagnosis Date   Ankle impingement syndrome    Right ankle from bone spur   Antiphospholipid antibody syndrome ([place]) 06/12/2012   Coagulopathy ([place]) 06/12/2012   DVT (deep venous thrombosis) ([place]) 1990, 2002   chronic hypercoagulobility.    DVT, recurrent, lower extremity, chronic 06/12/2012   1992   Dysrhythmia    AF   GERD (gastroesophageal reflux disease)    H/O tinnitus    Heterozygous factor V Leiden mutation ([place]) 06/12/2012   Hyperlipidemia    Migraine headache    Protein S deficiency ([place]) 06/12/2012   Prothrombin gene mutation ([place]) 06/12/2012   Pulmonary embolism ([place])    Pulmonary embolism, bilateral ([place]) 06/12/2012   July, 2002   PVC (premature ventricular contraction)    Varicose veins 06/12/2012   RLE   Wears hearing aid     Past Surgical History:  Procedure Laterality Date   ABDOMINAL AORTIC ANEURYSM REPAIR  12/14   [facility]   ANKLE SURGERY  2005   right   APPENDECTOMY     ARTERY BIOPSY Left 08/12/2014   Procedure: LEFT TEMPORAL ARTERY BIOPSY ;  Surgeon: Coralie Keens, MD;  Location: [facility];  Service: General;   Laterality: Left;   CHOLECYSTECTOMY  2008   lap choli   COLONOSCOPY N/A 09/03/2013   Procedure: COLONOSCOPY;  Surgeon: Garlan Fair, MD;  [facility];  Service: Endoscopy;  Laterality: N/A;   FINGER SURGERY     Right 1st finger,  by Dr. Kalman Shan TENDON REPAIR  2013   left 5th finger by Dr. Caralyn Guile   INCISION AND DRAINAGE HIP Left 02/17/2021   Procedure: IRRIGATION AND DEBRIDEMENT LEFT THIGH;  Surgeon: Dorna Leitz, MD;  [facility];  Service: Orthopedics;  Laterality: Left;   PATELLA RELEASE AND MANIPULATION  1992   Right Patella    There were no vitals filed for this visit.   Subjective Assessment - 09/10/21 1106     Subjective Feeling weary and not as alert today. Had a long trip to [facility] yesterday.    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    Currently in Pain? No/denies                [facility] PT Assessment - 09/10/21 0001  Standardized Balance Assessment   Standardized Balance Assessment Berg Balance Test      Berg Balance Test   Sit to Stand Able to stand without using hands and stabilize independently    Standing Unsupported Able to stand 2 minutes with supervision    Sitting with Back Unsupported but Feet Supported on Floor or Stool Able to sit safely and securely 2 minutes    Stand to Sit Sits safely with minimal use of hands    Transfers Able to transfer safely, minor use of hands    Standing Unsupported with Eyes Closed Able to stand 10 seconds with supervision    Standing Unsupported with Feet Together Able to place feet together independently and stand for 1 minute with supervision    From Standing, Reach Forward with Outstretched Arm Can reach forward >12 cm safely (5")    From Standing Position, Pick up Object from Floor Able to pick  up shoe, needs supervision    From Standing Position, Turn to Look Behind Over each Shoulder Looks behind from both sides and weight shifts well    Turn 360 Degrees Able to turn 360 degrees safely but slowly    Standing Unsupported, Alternately Place Feet on Step/Stool Able to stand independently and safely and complete 8 steps in 20 seconds    Standing Unsupported, One Foot in [employer] balance while stepping or standing    Standing on One Leg Able to lift leg independently and hold 5-10 seconds    Total Score 44                 Vestibular Assessment - 09/10/21 0001       Oculomotor Exam-Fixation Suppressed    Left Head Impulse slow sequential saccadic correction    Right Head Impulse negative                      [facility] Adult PT Treatment/Exercise - 09/10/21 0001       Neuro Re-ed    Neuro Re-ed Details  R/L forearm prop + gaze stabilization 2x5 each   4-5/10 dizziness            Vestibular Treatment/Exercise - 09/10/21 0001       X1 Viewing Horizontal   Foot Position support standing in RW    Reps 10   x2   Comments c/o eye strain      X1 Viewing Vertical   Foot Position standing in RW    Reps 10   2x10   Comments c/o eye strain and 3-4/10 dizziness   cues to limit ROM               Balance Exercises - 09/10/21 0001       Balance Exercises: Standing   Turning Right;Left;Limitations    Turning Limitations 1/4 turns to targets    Other Standing Exercises alt toe tap on cone 2x10 with CGA   cues to decrease speed; mild-mod imbalance               PT Education - 09/10/21 1156     Education Details update to HEP-Access Code: [email or web address]    Person(s) Educated Patient    Methods Explanation;Demonstration;Tactile cues;Verbal cues;Handout    Comprehension Returned demonstration;Verbalized understanding              PT Short Term Goals - 09/10/21 1157       PT SHORT TERM GOAL #1   Title The patient will be indep with  HEP for  gaze adaptation, habituation, balance, and general mobility.    Time 4    Period Weeks    Status On-going    Target Date 10/03/21      PT SHORT TERM GOAL #2   Title The patient will be able to move sit<>stand 5 reps without UE use, and without use of RW mod indep.    Time 4    Period Weeks    Status Achieved    Target Date 10/03/21      PT SHORT TERM GOAL #3   Title The patient will ambulate mod indep with RW in home and community x 300 ft without loss of balance.    Time 4    Period Weeks    Target Date 10/03/21      PT SHORT TERM GOAL #4   Title The patient will tolerate gaze adaptation x 1 viewing x 30 seconds in seated or standing position.    Time 4    Period Weeks    Target Date 10/03/21      PT SHORT TERM GOAL #5   Title The patient will demonstrate bed mobility with dizziness < or equal to 2/10.    Baseline reports falling posteriorly with sit>supine.    Time 4    Period Weeks    Target Date 10/03/21      PT SHORT TERM GOAL #6   Title PT to further assess Berg and/or FGA when appropriate.    Time 4    Period Weeks    Status Achieved    Target Date 10/03/21               PT Long Term Goals - 09/10/21 1158       PT LONG TERM GOAL #1   Title The patient will return to modified gym routine.    Time 8    Period Weeks    Target Date 11/02/21      PT LONG TERM GOAL #2   Title The patient will ambulate without an assistive device for household and community distances independently >500 ft.    Time 8    Period Weeks    Target Date 11/02/21      PT LONG TERM GOAL #3   Title The patient will tolerate gaze x 1 adaptation x 60 seconds without c/o dizziness.    Time 8    Period Weeks    Target Date 11/02/21      PT LONG TERM GOAL #4   Title The patient will improve Berg balance testing by 8 points.    Baseline 44/56    Time 8    Period Weeks    Target Date 11/02/21      PT LONG TERM GOAL #5   Title The patient will improve gait speed to > or equal  to 2.7 ft/sec demonstrating dec'd fall risk and return to full community ambulator classification of gait.    Time 8    Period Weeks    Target Date 11/02/21                   Plan - 09/10/21 1157     Clinical Impression Statement Patient arrived to session with report of fatigue and decreased mental clarity today after taking a long trip to [place] yesterday. Patient scored 44/56 on Berg, indicating an increased risk of falls. Re-tested HIT, which was positive to the L. Proceeded with progression of VOR activities in supported standing for increased  challenge. Advised to adjust ROM with VOR for decreased eye strain. Initiated gaze stability exercises in the frontal plane with c/o most significant dizziness at 5/10. Able to progress dynamic standing tasks with mild-moderate imbalance evident. Updated HEP with exercises that were well-tolerated today. Patient reported understanding and without complaints at end of session.    Comorbidities prior h/o R sensorineural hearing loss (unsure if vestibular nerve impairment), h/o clotting disorder, L hearing absent    PT Treatment/Interventions ADLs/Self Care Home Management;Patient/family education;Neuromuscular re-education;Balance training;Therapeutic exercise;Therapeutic activities;Functional mobility training;Gait training;Stair training;Manual techniques;Vestibular    PT Next Visit Plan Check HEP and progess VOR to unsupported standing, progress gaze reps to tolerance, standing balance in corner.  Assess bed mobility, and stairs.  Give pointers to improve safety with functional tasks and work on balance/functional mobility.    Consulted and Agree with Plan of Care Patient;Family member/caregiver    Family Member Consulted spouse             Patient will benefit from skilled therapeutic intervention in order to improve the following deficits and impairments:  Dizziness, Abnormal gait, Difficulty walking, Decreased activity tolerance, Decreased  balance, Decreased mobility, Postural dysfunction, Impaired vision/preception  Visit Diagnosis: Dizziness and giddiness  Other abnormalities of gait and mobility  Unsteadiness on feet     Problem List Patient Active Problem List   Diagnosis Date Noted   Vertigo 08/28/2021   PAF (paroxysmal atrial fibrillation) ([place]) 08/28/2021   Hematoma of left thigh 02/17/2021   Elevated LFTs    Acute epigastric pain 02/04/2017   HTN (hypertension) 09/27/2013   SNHL (sensorineural hearing loss) 05/24/2013   CAD (coronary artery disease) 04/18/2013   Chronic anticoagulation 04/18/2013   GERD (gastroesophageal reflux disease) 04/18/2013   History of pulmonary embolism 04/18/2013   Blood clotting disorder ([place]) 06/12/2012   Heterozygous factor V Leiden mutation ([place]) 06/12/2012   Prothrombin gene mutation ([place]) 06/12/2012   Antiphospholipid antibody syndrome ([place]) 06/12/2012   Protein S deficiency ([place]) 06/12/2012   DVT, recurrent, lower extremity, chronic 06/12/2012   Pulmonary embolism, bilateral ([place]) 06/12/2012   Varicose veins 06/12/2012   Chronic venous embolism and thrombosis of deep vessels of lower extremity ([place]) 06/12/2012   Primary hypercoagulable state ([place]) 06/12/2012   Aneurysm of abdominal vessel 03/12/2012   Dissection of aorta, abdominal ([place]) 03/12/2012   ABNORMALITY OF GAIT 07/02/2009   SPRAIN&STRAIN OTHER SPECIFIED SITES KNEE&LEG 07/02/2009     Janene Harvey, PT, DPT 09/10/21 12:00 PM   [facility] [address], [place], [place], [address] Phone: [phone]   Fax:  [phone]  Name: Levester Waldridge MRN: [id number] Date of Birth: [birth year]

## 2021-09-13 ENCOUNTER — Other Ambulatory Visit: Payer: Self-pay

## 2021-09-13 ENCOUNTER — Ambulatory Visit: Payer: Medicare Other | Admitting: Rehabilitative and Restorative Service Providers"

## 2021-09-13 DIAGNOSIS — R42 Dizziness and giddiness: Secondary | ICD-10-CM

## 2021-09-13 DIAGNOSIS — R2689 Other abnormalities of gait and mobility: Secondary | ICD-10-CM

## 2021-09-13 DIAGNOSIS — R2681 Unsteadiness on feet: Secondary | ICD-10-CM

## 2021-09-13 NOTE — Therapy (Signed)
[facility] [address], [place], [place], [address] Phone: [phone]   Fax:  [phone]  Physical Therapy Treatment  Patient Details  Name: Don Ruiz MRN: [id number] Date of Birth: [birth year] Referring Provider (PT): Gerlean Ren MD is hospitalist/ Dr. Marlou Porch MD is primary care   Encounter Date: 09/13/2021   PT End of Session - 09/13/21 1407     Visit Number 4    Number of Visits 17    Date for PT Re-Evaluation 11/02/21    Authorization Type [employer] Medicare    Progress Note Due on Visit 10    PT Start Time 1403    PT Stop Time 1445    PT Time Calculation (min) 42 min    Equipment Utilized During Treatment Gait belt    Activity Tolerance Patient tolerated treatment well    Behavior During Therapy [facility] for tasks assessed/performed             Past Medical History:  Diagnosis Date   Ankle impingement syndrome    Right ankle from bone spur   Antiphospholipid antibody syndrome ([place]) 06/12/2012   Coagulopathy ([place]) 06/12/2012   DVT (deep venous thrombosis) ([place]) 1990, 2002   chronic hypercoagulobility.    DVT, recurrent, lower extremity, chronic 06/12/2012   1992   Dysrhythmia    AF   GERD (gastroesophageal reflux disease)    H/O tinnitus    Heterozygous factor V Leiden mutation ([place]) 06/12/2012   Hyperlipidemia    Migraine headache    Protein S deficiency ([place]) 06/12/2012   Prothrombin gene mutation ([place]) 06/12/2012   Pulmonary embolism ([place])    Pulmonary embolism, bilateral ([place]) 06/12/2012   July, 2002   PVC (premature ventricular contraction)    Varicose veins 06/12/2012   RLE   Wears hearing aid     Past Surgical History:  Procedure Laterality Date   ABDOMINAL AORTIC ANEURYSM REPAIR  12/14   [facility]   ANKLE SURGERY  2005   right   APPENDECTOMY     ARTERY BIOPSY Left 08/12/2014   Procedure: LEFT TEMPORAL ARTERY BIOPSY ;  Surgeon: Coralie Keens, MD;  Location: [facility];  Service: General;   Laterality: Left;   CHOLECYSTECTOMY  2008   lap choli   COLONOSCOPY N/A 09/03/2013   Procedure: COLONOSCOPY;  Surgeon: Garlan Fair, MD;  Location: [facility];  Service: Endoscopy;  Laterality: N/A;   FINGER SURGERY     Right 1st finger,  by Dr. Kalman Shan TENDON REPAIR  2013   left 5th finger by Dr. Caralyn Guile   INCISION AND DRAINAGE HIP Left 02/17/2021   Procedure: IRRIGATION AND DEBRIDEMENT LEFT THIGH;  Surgeon: Dorna Leitz, MD;  Location: [facility];  Service: Orthopedics;  Laterality: Left;   PATELLA RELEASE AND MANIPULATION  1992   Right [place]    There were Ruiz vitals filed for this visit.   Subjective Assessment - 09/13/21 1406     Subjective The patient reports he gets mild HA from habituation with head turns.  He notes he can walk around te house without the device safely.  Stairs make him feel a bouncing in his head and he has a continued sense of swimming.    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  Currently in Pain? Ruiz/denies                [facility] PT Assessment - 09/13/21 1427       Assessment   Medical Diagnosis R42 vertigo    Referring Provider (PT) Gerlean Ren MD is hospitalist/ Dr. Marlou Porch MD is primary care    Onset Date/Surgical Date 08/27/21                           [facility] Adult PT Treatment/Exercise - 09/13/21 1428       Ambulation/Gait   Ambulation/Gait Yes    Ambulation/Gait Assistance 4: Min guard;5: Supervision    Ambulation/Gait Assistance Details The patient progressed this weak to unsupported walking in the home.  He is walking with supervision in clinic with occasional lateral steppig or crossing midline to recover-- CGA provided during those corrections.    Ambulation Distance (Feet) 150 Feet    Assistive device None     Ambulation Surface Level;Indoor    Stairs Yes    Stairs Assistance 6: Modified independent (Device/Increase time)    Stair Management Technique Two rails;Alternating pattern    Number of Stairs 4    Gait Comments dynamic gait including:  marching, walking backwards, tandem gait, figure 8 walking with CGA during dynamic tasks.  Also performed ball toss with gait      Neuro Re-ed    Neuro Re-ed Details  Compliant surface standing with eyes open + horiz head motion and vertical head motion, then eyes closed; standing habituation passing a ball between legs with squat, habituation with horizontal head turns reaching for a ball at various heights.  Single limb stance near support surface without UE support.  Partial heel/toe with eyes closed.             Vestibular Treatment/Exercise - 09/13/21 1431       Vestibular Treatment/Exercise   Vestibular Treatment Provided Habituation;Gaze    Habituation Exercises Standing Horizontal Head Turns;Standing Vertical Head Turns;Standing Diagonal Head Turns;360 degree Turns    Gaze Exercises X1 Viewing Horizontal;X1 Viewing Vertical;Eye/Head Exercise Horizontal      Standing Horizontal Head Turns   Number of Reps  10    Symptom Description  on foam      Standing Vertical Head Turns   Number of Reps  10    Symptom Description  on foam      Standing Diagonal Head Turns   Number of Reps  3    Symptiom Description  reaching to R knee then over L shoulder and reverse      360 degree Turns   Number of Reps  3    Symptom Description  mild dizziness 1-3/10    COMMENT did 1 turn, tapped cones alternating LEs and then did 2 more 360 degree turns      X1 Viewing Horizontal   Foot Position unsupported standing    Reps 5    Comments *reduced to 5 b/c patient is c/o headache that requires tylenol every day when doing these exercises      X1 Viewing Vertical   Foot Position unsupported standing    Reps 5    Comments *reduced to 5 b/c patient is c/o  headache that requires tylenol every day when doing these exercises      Eye/Head Exercise Horizontal   Foot Position unsupported standing feet apart    Comments eye/head coordination "eyes, then head R and eyes, then head L"  PT Education - 09/13/21 1608     Education Details HEP    Person(s) Educated Patient    Methods Explanation;Demonstration;Handout    Comprehension Verbalized understanding;Returned demonstration              PT Short Term Goals - 09/13/21 1613       PT SHORT TERM GOAL #1   Title The patient will be indep with HEP for gaze adaptation, habituation, balance, and general mobility.    Time 4    Period Weeks    Status On-going    Target Date 10/03/21      PT SHORT TERM GOAL #2   Title The patient will be able to move sit<>stand 5 reps without UE use, and without use of RW mod indep.    Time 4    Period Weeks    Status Achieved    Target Date 10/03/21      PT SHORT TERM GOAL #3   Title The patient will ambulate mod indep with RW in home and community x 300 ft without loss of balance.    Baseline *The patient reports he was safe with the walker and has progressed to Ruiz device -- intermittent UE support on walls/furniture 09/13/21.    Time 4    Period Weeks    Status Achieved    Target Date 10/03/21      PT SHORT TERM GOAL #4   Title The patient will tolerate gaze adaptation x 1 viewing x 30 seconds in seated or standing position.    Time 4    Period Weeks    Status On-going    Target Date 10/03/21      PT SHORT TERM GOAL #5   Title The patient will demonstrate bed mobility with dizziness < or equal to 2/10.    Baseline reports falling posteriorly with sit>supine.    Time 4    Period Weeks    Status On-going    Target Date 10/03/21      PT SHORT TERM GOAL #6   Title PT to further assess Berg and/or FGA when appropriate.    Time 4    Period Weeks    Status Achieved    Target Date 10/03/21                PT Long Term Goals - 09/10/21 1158       PT LONG TERM GOAL #1   Title The patient will return to modified gym routine.    Time 8    Period Weeks    Target Date 11/02/21      PT LONG TERM GOAL #2   Title The patient will ambulate without an assistive device for household and community distances independently >500 ft.    Time 8    Period Weeks    Target Date 11/02/21      PT LONG TERM GOAL #3   Title The patient will tolerate gaze x 1 adaptation x 60 seconds without c/o dizziness.    Time 8    Period Weeks    Target Date 11/02/21      PT LONG TERM GOAL #4   Title The patient will improve Berg balance testing by 8 points.    Baseline 44/56    Time 8    Period Weeks    Target Date 11/02/21      PT LONG TERM GOAL #5   Title The patient will improve gait speed to > or equal to 2.7  ft/sec demonstrating dec'd fall risk and return to full community ambulator classification of gait.    Time 8    Period Weeks    Target Date 11/02/21                   Plan - 09/13/21 1614     Clinical Impression Statement The patient is showing excellent progress with mobility.  PT modified HEP to be more difficult for balance challenges adding single leg stance, and partial tandem with eyes closed.  We reduced VOR reps due to reports of HA that does not settle without tylenol after completing.  PT will work to increase reps based on patient HA and subjective reports.  We also discussed gym routine (limited activities there), and home walking with a hiking stick.  PT to continue to progress to tolerance.    PT Treatment/Interventions ADLs/Self Care Home Management;Patient/family education;Neuromuscular re-education;Balance training;Therapeutic exercise;Therapeutic activities;Functional mobility training;Gait training;Stair training;Manual techniques;Vestibular    PT Next Visit Plan Check HEP and progess VOR reps if able to tolerate, standing balance in corner.  Assess bed mobility.  Give pointers  to improve safety with functional tasks and work on balance/functional mobility.    Consulted and Agree with Plan of Care Patient             Patient will benefit from skilled therapeutic intervention in order to improve the following deficits and impairments:     Visit Diagnosis: Dizziness and giddiness  Other abnormalities of gait and mobility  Unsteadiness on feet     Problem List Patient Active Problem List   Diagnosis Date Noted   Vertigo 08/28/2021   PAF (paroxysmal atrial fibrillation) ([place]) 08/28/2021   Hematoma of left thigh 02/17/2021   Elevated LFTs    Acute epigastric pain 02/04/2017   HTN (hypertension) 09/27/2013   SNHL (sensorineural hearing loss) 05/24/2013   CAD (coronary artery disease) 04/18/2013   Chronic anticoagulation 04/18/2013   GERD (gastroesophageal reflux disease) 04/18/2013   History of pulmonary embolism 04/18/2013   Blood clotting disorder ([place]) 06/12/2012   Heterozygous factor V Leiden mutation ([place]) 06/12/2012   Prothrombin gene mutation ([place]) 06/12/2012   Antiphospholipid antibody syndrome ([place]) 06/12/2012   Protein S deficiency ([place]) 06/12/2012   DVT, recurrent, lower extremity, chronic 06/12/2012   Pulmonary embolism, bilateral ([place]) 06/12/2012   Varicose veins 06/12/2012   Chronic venous embolism and thrombosis of deep vessels of lower extremity ([place]) 06/12/2012   Primary hypercoagulable state ([place]) 06/12/2012   Aneurysm of abdominal vessel 03/12/2012   Dissection of aorta, abdominal ([place]) 03/12/2012   ABNORMALITY OF GAIT 07/02/2009   SPRAIN&STRAIN OTHER SPECIFIED SITES KNEE&LEG 07/02/2009    Jaevin Medearis, PT 09/13/2021, 4:17 PM  [facility] [address] [place], [place], [address] Phone: [phone]   Fax:  [phone]  Name: Don Ruiz MRN: [id number] Date of Birth: 1951-08-18

## 2021-09-13 NOTE — Patient Instructions (Signed)
Access Code: [email or web address] URL: [email or web address] Date: 09/13/2021 Prepared by: Rudell Cobb  Program Notes RULES FOR EXERCISE:  Don't let symptoms go above a 5/10 and if symptoms last > 10 minutes after exercises, do less reps.   Exercises Single Leg Stance - 2 x daily - 7 x weekly - 1 sets - 3 reps - 10-15 seconds hold Half Tandem Stance Balance with Eyes Closed - 2 x daily - 7 x weekly - 1 sets - 3 reps - 30 seconds hold Standing Gaze Stabilization with Head Rotation - 1 x daily - 5 x weekly - 2-3 sets - 5 reps Standing Gaze Stabilization with Head Nod - 1 x daily - 5 x weekly - 2-3 sets - 5 reps Sit to Sidelying with Prop on Forearm Both Arms - 1 x daily - 5 x weekly - 2 sets - 5 reps

## 2021-09-16 ENCOUNTER — Emergency Department (HOSPITAL_BASED_OUTPATIENT_CLINIC_OR_DEPARTMENT_OTHER)
Admission: EM | Admit: 2021-09-16 | Discharge: 2021-09-16 | Disposition: A | Discharge status: Home / Self Care | Payer: Medicare Other | Attending: Emergency Medicine | Admitting: Emergency Medicine

## 2021-09-16 ENCOUNTER — Encounter (HOSPITAL_BASED_OUTPATIENT_CLINIC_OR_DEPARTMENT_OTHER): Payer: Self-pay

## 2021-09-16 ENCOUNTER — Ambulatory Visit: Payer: Medicare Other | Admitting: Physical Therapy

## 2021-09-16 ENCOUNTER — Other Ambulatory Visit: Payer: Self-pay

## 2021-09-16 ENCOUNTER — Emergency Department (HOSPITAL_BASED_OUTPATIENT_CLINIC_OR_DEPARTMENT_OTHER): Payer: Medicare Other

## 2021-09-16 DIAGNOSIS — R1030 Lower abdominal pain, unspecified: Secondary | ICD-10-CM | POA: Insufficient documentation

## 2021-09-16 DIAGNOSIS — R791 Abnormal coagulation profile: Secondary | ICD-10-CM

## 2021-09-16 DIAGNOSIS — R7989 Other specified abnormal findings of blood chemistry: Secondary | ICD-10-CM | POA: Insufficient documentation

## 2021-09-16 DIAGNOSIS — R319 Hematuria, unspecified: Secondary | ICD-10-CM | POA: Diagnosis not present

## 2021-09-16 LAB — URINALYSIS, ROUTINE W REFLEX MICROSCOPIC
Bilirubin Urine: NEGATIVE
Glucose, UA: NEGATIVE mg/dL
Ketones, ur: NEGATIVE mg/dL
Nitrite: NEGATIVE
Protein, ur: 30 mg/dL — AB
RBC / HPF: >50 RBC/hpf — ABNORMAL HIGH (ref 0–5)
Specific Gravity, Urine: 1.016 (ref 1.005–1.030)
pH: 6.5 (ref 5.0–8.0)

## 2021-09-16 LAB — CBC WITH DIFFERENTIAL/PLATELET
Abs Immature Granulocytes: 0.1 10*3/uL — ABNORMAL HIGH (ref 0.00–0.07)
Basophils Absolute: 0 10*3/uL (ref 0.0–0.1)
Basophils Relative: 0 %
Eosinophils Absolute: 0.1 10*3/uL (ref 0.0–0.5)
Eosinophils Relative: 1 %
HCT: 45.5 % (ref 39.0–52.0)
Hemoglobin: 14.7 g/dL (ref 13.0–17.0)
Immature Granulocytes: 1 %
Lymphocytes Relative: 18 %
Lymphs Abs: 2.4 10*3/uL (ref 0.7–4.0)
MCH: 28.4 pg (ref 26.0–34.0)
MCHC: 32.3 g/dL (ref 30.0–36.0)
MCV: 88 fL (ref 80.0–100.0)
Monocytes Absolute: 1.1 10*3/uL — ABNORMAL HIGH (ref 0.1–1.0)
Monocytes Relative: 9 %
Neutro Abs: 9 10*3/uL — ABNORMAL HIGH (ref 1.7–7.7)
Neutrophils Relative %: 71 %
Platelets: 255 10*3/uL (ref 150–400)
RBC: 5.17 MIL/uL (ref 4.22–5.81)
RDW: 14.6 % (ref 11.5–15.5)
WBC: 12.8 10*3/uL — ABNORMAL HIGH (ref 4.0–10.5)
nRBC: 0 % (ref 0.0–0.2)

## 2021-09-16 LAB — COMPREHENSIVE METABOLIC PANEL
ALT: 49 U/L — ABNORMAL HIGH (ref 0–44)
AST: 18 U/L (ref 15–41)
Albumin: 3.6 g/dL (ref 3.5–5.0)
Alkaline Phosphatase: 62 U/L (ref 38–126)
Anion gap: 8 (ref 5–15)
BUN: 26 mg/dL — ABNORMAL HIGH (ref 8–23)
CO2: 31 mmol/L (ref 22–32)
Calcium: 8.8 mg/dL — ABNORMAL LOW (ref 8.9–10.3)
Chloride: 100 mmol/L (ref 98–111)
Creatinine, Ser: 1.38 mg/dL — ABNORMAL HIGH (ref 0.61–1.24)
GFR, Estimated: 56 mL/min — ABNORMAL LOW (ref >60)
Glucose, Bld: 79 mg/dL (ref 70–99)
Potassium: 4.1 mmol/L (ref 3.5–5.1)
Sodium: 139 mmol/L (ref 135–145)
Total Bilirubin: 1.1 mg/dL (ref 0.3–1.2)
Total Protein: 6.6 g/dL (ref 6.5–8.1)

## 2021-09-16 LAB — PROTIME-INR
INR: >10 (ref 0.8–1.2)
Prothrombin Time: 85.2 seconds — ABNORMAL HIGH (ref 11.4–15.2)

## 2021-09-16 MED ORDER — PHYTONADIONE 5 MG PO TABS
5.0000 mg | ORAL_TABLET | Freq: Once | ORAL | Status: AC
  Administered 2021-09-16: 5 mg via ORAL
  Filled 2021-09-16: qty 1

## 2021-09-16 MED ORDER — SODIUM CHLORIDE 0.9 % IV BOLUS
1000.0000 mL | Freq: Once | INTRAVENOUS | Status: AC
  Administered 2021-09-16: 1000 mL via INTRAVENOUS

## 2021-09-16 NOTE — ED Triage Notes (Signed)
Pt sent here by PCP for INR >10. Pt reports urinating blood for 48hrs. Pt reports feeling like he is bleeding out into his abdomen. Abdomen soft and tender to touch. Pt reports being dizzy and was recently hospitalized for vertigo. Pt A&Ox4 at time of triage. VSS.

## 2021-09-16 NOTE — Discharge Instructions (Signed)
You have been seen and discharged from the emergency department.  Your INR was found to be elevated.  You do have blood in the urine but there are no other findings of anemia or acute hemorrhage on your blood work/vital signs.  There was mild dehydration on your blood work as well.  You are given IV fluids and a dose of vitamin K for gentle INR reversal.  Hold your Coumadin dose tonight, take half a dose 2.5 mg tomorrow night on Friday (09/17/2021).  You may resume your regular dose of 5 mg over the weekend.  It is recommended that you have a repeat INR done through your primary doctor's office tomorrow Friday (1/13) and Monday (1/16).  Follow-up with your primary provider for further evaluation and further care. Take home medications as prescribed. If you have any worsening symptoms, symptoms of anemia including chest pain/shortness of breath/lightheadedness, change in stool color/rectal bleeding, any injury to the head/chest/abdomen or further concerns for your health please return to an emergency department for further evaluation.

## 2021-09-16 NOTE — ED Provider Notes (Signed)
[facility] EMERGENCY DEPT Provider Note   CSN: [id number] Arrival date & time: 09/16/21  9381     History  Chief Complaint  Patient presents with   Abnormal Labs    Don Ruiz is a 71 y.o. male.  HPI  71 year old male with past medical history of factor V Leiden with previous DVT/PE anticoagulated on Coumadin, HTN, HLD presents the emergency department concern for supratherapeutic INR on outpatient blood work.  Patient has been experiencing hematuria for the past 2 days with some mild suprapubic discomfort.  Blood work done as an outpatient showed INR greater than 10.  Patient denies any GI bleeding, symptoms of anemia including chest pain, shortness of breath, fatigue.  He did recently have a left ear infection and was given oral medications for this.  He is also had decreased p.o. intake.  No acute fever.  No hemoptysis, stool color change, gum bleeding.  Home Medications Prior to Admission medications   Medication Sig Start Date End Date Taking? Authorizing Provider  acetaminophen (TYLENOL) 325 MG tablet Take 2 tablets (650 mg total) by mouth every 6 (six) hours as needed for mild pain (or Fever >/= 101). 09/02/21   Elgergawy, Silver Huguenin, MD  diazepam (VALIUM) 5 MG tablet Take 2 tablets (10 mg total) by mouth every 8 (eight) hours as needed (vertigo). 09/02/21   Elgergawy, Silver Huguenin, MD  diphenhydrAMINE (BENADRYL) 25 MG tablet Take 25 mg by mouth as needed for sleep.    [provider]  diphenhydramine-acetaminophen (TYLENOL PM) 25-500 MG TABS tablet Take 2 tablets by mouth at bedtime as needed (for sleep).    [provider]  ezetimibe (ZETIA) 10 MG tablet Take 1 tablet by mouth daily. 01/27/17   [provider]  gabapentin (NEURONTIN) 600 MG tablet Take 300-600 mg by mouth See admin instructions. 600 in the morning and 300 at night    [provider]  Multiple Vitamin (MULITIVITAMIN WITH MINERALS) TABS Take 1 tablet by mouth daily.     [provider]  nitroGLYCERIN (NITROSTAT) 0.3 MG SL tablet Place 0.3 mg under the tongue every 5 (five) minutes as needed for chest pain.    [provider]  pantoprazole (PROTONIX) 40 MG tablet Take 1 tablet (40 mg total) by mouth daily. 09/02/21 10/02/21  Elgergawy, Silver Huguenin, MD  pravastatin (PRAVACHOL) 80 MG tablet Take 80 mg by mouth daily.    [provider]  psyllium (METAMUCIL) 58.6 % packet Take 1 packet by mouth daily.    [provider]  ramipril (ALTACE) 10 MG capsule Take 10 mg by mouth daily. 06/12/21   [provider]  warfarin (COUMADIN) 5 MG tablet Take 5 mg by mouth daily. 5 mg every day.    [provider]      Allergies    Amoxicillin-pot clavulanate, Aspirin, Niacin and related, Phisohex [hexachlorophene], Rosuvastatin, and Topamax [topiramate]    Review of Systems   Review of Systems  Constitutional:  Negative for fever.  Respiratory:  Negative for shortness of breath.   Cardiovascular:  Negative for chest pain and leg swelling.  Gastrointestinal:  Positive for abdominal pain. Negative for anal bleeding, blood in stool, diarrhea and vomiting.  Genitourinary:  Positive for hematuria.  Skin:  Negative for pallor and rash.  Neurological:  Negative for light-headedness and headaches.   Physical Exam Updated Vital Signs BP (!) 157/87    Pulse 62    Temp 98.1 F (36.7 C) (Oral)    Resp 13  Ht 6\' 4"  (1.93 m)    Wt 90.7 kg    SpO2 98%    BMI 24.34 kg/m  Physical Exam Vitals and nursing note reviewed.  Constitutional:      General: He is not in acute distress.    Appearance: Normal appearance. He is not ill-appearing or diaphoretic.  HENT:     Head: Normocephalic.     Mouth/Throat:     Mouth: Mucous membranes are moist.  Eyes:     Conjunctiva/sclera: Conjunctivae normal.     Pupils: Pupils are equal, round, and reactive to light.  Cardiovascular:     Rate and Rhythm: Normal rate.  Pulmonary:     Effort:  Pulmonary effort is normal. No respiratory distress.  Abdominal:     Palpations: Abdomen is soft.     Tenderness: There is no abdominal tenderness. There is no guarding or rebound.  Skin:    General: Skin is warm.  Neurological:     Mental Status: He is alert and oriented to person, place, and time. Mental status is at baseline.  Psychiatric:        Mood and Affect: Mood normal.    ED Results / Procedures / Treatments   Labs (all labs ordered are listed, but only abnormal results are displayed) Labs Reviewed  COMPREHENSIVE METABOLIC PANEL - Abnormal; Notable for the following components:      Result Value   BUN 26 (*)    Creatinine, Ser 1.38 (*)    Calcium 8.8 (*)    ALT 49 (*)    GFR, Estimated 56 (*)    All other components within normal limits  CBC WITH DIFFERENTIAL/PLATELET - Abnormal; Notable for the following components:   WBC 12.8 (*)    Neutro Abs 9.0 (*)    Monocytes Absolute 1.1 (*)    Abs Immature Granulocytes 0.10 (*)    All other components within normal limits  PROTIME-INR - Abnormal; Notable for the following components:   Prothrombin Time 85.2 (*)    INR >10.0 (*)    All other components within normal limits  URINALYSIS, ROUTINE W REFLEX MICROSCOPIC    EKG None  Radiology No results found.  Procedures .Critical Care Performed by: Lorelle Gibbs, DO Authorized by: Lorelle Gibbs, DO   Critical care provider statement:    Critical care time (minutes):  45   Critical care time was exclusive of:  Separately billable procedures and treating other patients   Critical care was necessary to treat or prevent imminent or life-threatening deterioration of the following conditions:  Metabolic crisis and dehydration   Critical care was time spent personally by me on the following activities:  Development of treatment plan with patient or surrogate, evaluation of patient's response to treatment, examination of patient, ordering and review of laboratory  studies, ordering and review of radiographic studies, ordering and performing treatments and interventions, pulse oximetry, re-evaluation of patient's condition and review of old charts   I assumed direction of critical care for this patient from another provider in my specialty: no      Medications Ordered in ED Medications  phytonadione (VITAMIN K) tablet 5 mg (has no administration in time range)  sodium chloride 0.9 % bolus 1,000 mL (1,000 mLs Intravenous New Bag/Given 09/16/21 0806)    ED Course/ Medical Decision Making/ A&P                           Medical Decision Making  This patient presents to the ED for concern of abnormal outpatient blood work, elevated INR, this involves an extensive number of treatment options, and is a complaint that carries with it a high risk of complications and morbidity.  The differential diagnosis includes    Additional history obtained: -Additional history obtained from wife -External records from outside source obtained and reviewed including: Chart review including previous notes, labs, imaging, consultation notes   Lab Tests: -I ordered, reviewed, and interpreted labs.  The pertinent results include: INR greater than 10, stable hemoglobin, very mild AKI, urinalysis with hematuria   EKG -Sinus rhythm with no change   Imaging Studies ordered: -I ordered imaging studies including CT of the abdomen and pelvis -I independently visualized and interpreted imaging which showed known right renal cyst with some surrounding inflammation/stranding -I agree with the radiologist interpretation   Medicines ordered and prescription drug management: -I ordered medication including IV fluids, vitamin K for dehydration and INR reversal -Reevaluation of the patient after these medicines showed that the patient improved -I have reviewed the patients home medicines and have made adjustments as needed   ED Course: 71 year old male presents emergency  department concern for abnormal blood work as an outpatient.  Concern for elevated INR with hematuria.  Blood work here confirms elevated INR over 10.  Stable hemoglobin with no other signs of acute hemorrhage.  There is a mild AKI and a urinalysis with large amount of blood, no signs of infection.  CT of the abdomen pelvis identifies a known right renal cyst with some surrounding stranding/inflammation.  There is mention of possible renal vein thrombosis however with a supratherapeutic INR I find this very unlikely.  Possible pyelonephritis, patient is currently on Levaquin from his primary doctor for possible UTI.  Although patient has evidence of active bleeding in regards to the hematuria there is no signs of active hemorrhage or instability.  Vitals are stable.  After IV fluids patient feels improved.  Following guidelines I will plan for single dose of vitamin K here in the department.  No indication for FFP, risks are high in regards to his history of factor 5-3 reverse the INR too quickly.  Patient held his Coumadin dose last night.  I recommend that he hold his dose tonight as well.  I have recommended half the dose tomorrow night and then he may resume his normal regimen.  He is been instructed to follow-up with his primary doctor for repeat INR testing tomorrow and Monday.  The patient and the wife understand this plan.  I have educated them thoroughly on other signs of bleeding and changes to return to the emergency department immediately for.  They understand and are comfortable following as an outpatient. Secure chat to PCP Dr. Elspeth Cho sent with updates.    Critical Interventions: Vitamin K for INR reversal   Cardiac Monitoring: The patient was maintained on a cardiac monitor.  I personally viewed and interpreted the cardiac monitored which showed an underlying rhythm of: Sinus rhythm   Reevaluation: After the interventions noted above, I reevaluated the patient and found that they have  :improved   Dispostion: Patient at this time appears safe and stable for discharge and close outpatient follow up. Discharge plan and strict return to ED precautions discussed, patient verbalizes understanding and agreement.        Final Clinical Impression(s) / ED Diagnoses Final diagnoses:  None    Rx / DC Orders ED Discharge Orders     None  Lorelle Gibbs, DO 09/16/21 1153

## 2021-09-20 ENCOUNTER — Encounter: Payer: Self-pay | Admitting: Rehabilitative and Restorative Service Providers"

## 2021-09-20 ENCOUNTER — Ambulatory Visit: Payer: Medicare Other | Admitting: Rehabilitative and Restorative Service Providers"

## 2021-09-20 ENCOUNTER — Other Ambulatory Visit: Payer: Self-pay

## 2021-09-20 DIAGNOSIS — R2681 Unsteadiness on feet: Secondary | ICD-10-CM

## 2021-09-20 DIAGNOSIS — R2689 Other abnormalities of gait and mobility: Secondary | ICD-10-CM

## 2021-09-20 DIAGNOSIS — R42 Dizziness and giddiness: Secondary | ICD-10-CM

## 2021-09-20 NOTE — Therapy (Signed)
[facility] [address], [place], [place], [address] Phone: [phone]   Fax:  [phone]  Physical Therapy Treatment  Patient Details  Name: Don Ruiz MRN: [id number] Date of Birth: 01/27/1951 Referring Provider (PT): Gerlean Ren MD is hospitalist/ Dr. Marlou Porch MD is primary care   Encounter Date: 09/20/2021   PT End of Session - 09/20/21 1400     Visit Number 5    Number of Visits 17    Date for PT Re-Evaluation 11/02/21    Authorization Type UHC Medicare    Progress Note Due on Visit 10    PT Start Time 1402    PT Stop Time 1445    PT Time Calculation (min) 43 min    Equipment Utilized During Treatment Gait belt    Activity Tolerance Patient tolerated treatment well    Behavior During Therapy [facility] for tasks assessed/performed             Past Medical History:  Diagnosis Date   Ankle impingement syndrome    Right ankle from bone spur   Antiphospholipid antibody syndrome ([place]) 06/12/2012   Coagulopathy ([place]) 06/12/2012   DVT (deep venous thrombosis) ([place]) 1990, 2002   chronic hypercoagulobility.    DVT, recurrent, lower extremity, chronic 06/12/2012   1992   Dysrhythmia    AF   GERD (gastroesophageal reflux disease)    H/O tinnitus    Heterozygous factor V Leiden mutation ([place]) 06/12/2012   Hyperlipidemia    Migraine headache    Protein S deficiency ([place]) 06/12/2012   Prothrombin gene mutation ([place]) 06/12/2012   Pulmonary embolism ([place])    Pulmonary embolism, bilateral ([place]) 06/12/2012   July, 2002   PVC (premature ventricular contraction)    Varicose veins 06/12/2012   RLE   Wears hearing aid     Past Surgical History:  Procedure Laterality Date   ABDOMINAL AORTIC ANEURYSM REPAIR  12/14   [facility]   ANKLE SURGERY  2005   right   APPENDECTOMY     ARTERY BIOPSY Left 08/12/2014   Procedure: LEFT TEMPORAL ARTERY BIOPSY ;  Surgeon: Coralie Keens, MD;  Location: [place];  Service:  General;  Laterality: Left;   CHOLECYSTECTOMY  2008   lap choli   COLONOSCOPY N/A 09/03/2013   Procedure: COLONOSCOPY;  Surgeon: Garlan Fair, MD;  Location: [facility];  Service: Endoscopy;  Laterality: N/A;   FINGER SURGERY     Right 1st finger,  by Dr. Kalman Shan TENDON REPAIR  2013   left 5th finger by Dr. Caralyn Guile   INCISION AND DRAINAGE HIP Left 02/17/2021   Procedure: IRRIGATION AND DEBRIDEMENT LEFT THIGH;  Surgeon: Dorna Leitz, MD;  Location: [facility];  Service: Orthopedics;  Laterality: Left;   PATELLA RELEASE AND MANIPULATION  1992   Right Patella    There were no vitals filed for this visit.   Subjective Assessment - 09/20/21 1403     Subjective The patient went to ED Wed-Thurs due to blood in his urine.  His INR was elevated and BP was low (he felt orthostatic).  He didn't do activities/exercises b/c he was not feeling well.  He had to go back to using the RW and feels like he lost ground.  He notes more visual blurring with gaze adaptation.    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    Currently in Pain? No/denies                [facility] PT Assessment - 09/20/21 1655       Assessment   Medical Diagnosis R42 vertigo    Referring Provider (PT) Gerlean Ren MD is hospitalist/ Dr. Marlou Porch MD is primary care    Onset Date/Surgical Date 08/27/21                           [facility] Adult PT Treatment/Exercise - 09/20/21 1417       Transfers   Transfers Sit to Stand;Stand to Sit    Sit to Stand 7: Independent    Stand to Sit 7: Independent    Comments can do 5 quick sit<>stand motions with eyes open and then repeated with eyes closed      Ambulation/Gait   Ambulation/Gait Yes    Ambulation/Gait Assistance 6: Modified independent (Device/Increase  time)    Gait Comments dynamic gait with ball toss, 180 degree turns      Neuro Re-ed    Neuro Re-ed Details  Compliant step ups reaching to place cones on top of portable mirror, stepping down + turning + bending to pick up cones for multi-directional balance and habituation x 4 reps turning R And L sides.  Foam standing with eyes open, eyes closed, and then eyes open + head turns.  Standing alternating foot taps to 8" height surface, single limb stance activities working on raching R And L to tap various objects.      Exercises   Exercises Other Exercises    Other Exercises  quadriped alternating UE/LEs for core balance/engagement, quadriped to tall kneeling for habituation.             Vestibular Treatment/Exercise - 09/20/21 1413       Vestibular Treatment/Exercise   Vestibular Treatment Provided Habituation;Gaze    Habituation Exercises Horizontal Roll;Seated Vertical Head Turns;Standing Vertical Head Turns;Standing Diagonal Head Turns;180 degree Turns    Gaze Exercises X1 Viewing Horizontal;X1 Viewing Vertical      Horizontal Roll   Number of Reps  5    Symptom Description  2-3/10 symptoms provocation      Seated Vertical Head Turns   Number of Reps  10    Symptom Description  5/10 symptoms      Standing Horizontal Head Turns   Number of Reps  10    Symptom Description  on foam      Standing Vertical Head Turns   Number of Reps  10    Symptom Description  on foam      180 degree Turns   Number of Reps  3      X1 Viewing Horizontal   Foot Position standing unsupported with feet apart    Reps 10      X1 Viewing Vertical   Foot Position standing unsupported with feet apart    Reps 10    Comments *had reduced to 5 reps-- no increase in HA today however took tylenol before session                      PT Short Term Goals - 09/20/21 1416       PT SHORT TERM GOAL #1   Title The patient will be indep with HEP for gaze adaptation,  habituation,  balance, and general mobility.    Time 4    Period Weeks    Status Achieved    Target Date 10/03/21      PT SHORT TERM GOAL #2   Title The patient will be able to move sit<>stand 5 reps without UE use, and without use of RW mod indep.    Time 4    Period Weeks    Status Achieved    Target Date 10/03/21      PT SHORT TERM GOAL #3   Title The patient will ambulate mod indep with RW in home and community x 300 ft without loss of balance.    Baseline *The patient reports he was safe with the walker and has progressed to no device -- intermittent UE support on walls/furniture 09/13/21.    Time 4    Period Weeks    Status Achieved    Target Date 10/03/21      PT SHORT TERM GOAL #4   Title The patient will tolerate gaze adaptation x 1 viewing x 30 seconds in seated or standing position.    Time 4    Period Weeks    Status On-going    Target Date 10/03/21      PT SHORT TERM GOAL #5   Title The patient will demonstrate bed mobility with dizziness < or equal to 2/10.    Baseline reports falling posteriorly with sit>supine.    Time 4    Period Weeks    Status Achieved    Target Date 10/03/21      PT SHORT TERM GOAL #6   Title PT to further assess Berg and/or FGA when appropriate.    Time 4    Period Weeks    Status Achieved    Target Date 10/03/21               PT Long Term Goals - 09/10/21 1158       PT LONG TERM GOAL #1   Title The patient will return to modified gym routine.    Time 8    Period Weeks    Target Date 11/02/21      PT LONG TERM GOAL #2   Title The patient will ambulate without an assistive device for household and community distances independently >500 ft.    Time 8    Period Weeks    Target Date 11/02/21      PT LONG TERM GOAL #3   Title The patient will tolerate gaze x 1 adaptation x 60 seconds without c/o dizziness.    Time 8    Period Weeks    Target Date 11/02/21      PT LONG TERM GOAL #4   Title The patient will improve Berg balance  testing by 8 points.    Baseline 44/56    Time 8    Period Weeks    Target Date 11/02/21      PT LONG TERM GOAL #5   Title The patient will improve gait speed to > or equal to 2.7 ft/sec demonstrating dec'd fall risk and return to full community ambulator classification of gait.    Time 8    Period Weeks    Target Date 11/02/21                   Plan - 09/20/21 1653     Clinical Impression Statement The patient had a set back last week due to a change in medical  status and not being able to be as active.  However, he is still tolerating increased activity in therapy and progresing well.  PT to continue to challenge balance working on return to outdoor/community surface gait, and return to tolerating greater periods of standing for returning to work.    PT Treatment/Interventions ADLs/Self Care Home Management;Patient/family education;Neuromuscular re-education;Balance training;Therapeutic exercise;Therapeutic activities;Functional mobility training;Gait training;Stair training;Manual techniques;Vestibular    PT Next Visit Plan Check HEP and progess VOR reps if able to tolerate, standing balance in corner.   unlevel surface negotiation/ community gait for return to walking, and begin to work on return to gym routine.    Consulted and Agree with Plan of Care Patient             Patient will benefit from skilled therapeutic intervention in order to improve the following deficits and impairments:     Visit Diagnosis: Dizziness and giddiness  Unsteadiness on feet  Other abnormalities of gait and mobility     Problem List Patient Active Problem List   Diagnosis Date Noted   Vertigo 08/28/2021   PAF (paroxysmal atrial fibrillation) ([place]) 08/28/2021   Hematoma of left thigh 02/17/2021   Elevated LFTs    Acute epigastric pain 02/04/2017   HTN (hypertension) 09/27/2013   SNHL (sensorineural hearing loss) 05/24/2013   CAD (coronary artery disease) 04/18/2013   Chronic  anticoagulation 04/18/2013   GERD (gastroesophageal reflux disease) 04/18/2013   History of pulmonary embolism 04/18/2013   Blood clotting disorder ([place]) 06/12/2012   Heterozygous factor V Leiden mutation ([place]) 06/12/2012   Prothrombin gene mutation ([place]) 06/12/2012   Antiphospholipid antibody syndrome ([place]) 06/12/2012   Protein S deficiency ([place]) 06/12/2012   DVT, recurrent, lower extremity, chronic 06/12/2012   Pulmonary embolism, bilateral ([place]) 06/12/2012   Varicose veins 06/12/2012   Chronic venous embolism and thrombosis of deep vessels of lower extremity ([place]) 06/12/2012   Primary hypercoagulable state ([place]) 06/12/2012   Aneurysm of abdominal vessel 03/12/2012   Dissection of aorta, abdominal ([place]) 03/12/2012   ABNORMALITY OF GAIT 07/02/2009   SPRAIN&STRAIN OTHER SPECIFIED SITES KNEE&LEG 07/02/2009    Yesmin Mutch, PT 09/20/2021, 4:59 PM  [facility] [address] [place], [place], [address] Phone: [phone]   Fax:  [phone]  Name: Don Ruiz MRN: [id number] Date of Birth: 22-Jan-1951

## 2021-09-22 ENCOUNTER — Ambulatory Visit: Payer: Medicare Other | Admitting: Physical Therapy

## 2021-09-22 ENCOUNTER — Other Ambulatory Visit: Payer: Self-pay

## 2021-09-22 ENCOUNTER — Encounter: Payer: Self-pay | Admitting: Physical Therapy

## 2021-09-22 DIAGNOSIS — R2689 Other abnormalities of gait and mobility: Secondary | ICD-10-CM

## 2021-09-22 DIAGNOSIS — R2681 Unsteadiness on feet: Secondary | ICD-10-CM

## 2021-09-22 DIAGNOSIS — R42 Dizziness and giddiness: Secondary | ICD-10-CM | POA: Diagnosis not present

## 2021-09-22 NOTE — Therapy (Signed)
[facility] [address], [place], [place], [address] Phone: [phone]   Fax:  [phone]  Physical Therapy Treatment  Patient Details  Name: Don Ruiz MRN: [id number] Date of Birth: 1951-04-12 Referring Provider (PT): Gerlean Ren MD is hospitalist/ Dr. Marlou Porch MD is primary care   Encounter Date: 09/22/2021   PT End of Session - 09/22/21 1627     Visit Number 6    Number of Visits 17    Date for PT Re-Evaluation 11/02/21    Authorization Type UHC Medicare    Progress Note Due on Visit 10    PT Start Time 1404    PT Stop Time 1442    PT Time Calculation (min) 38 min    Equipment Utilized During Treatment Gait belt    Activity Tolerance Patient tolerated treatment well    Behavior During Therapy [facility] for tasks assessed/performed             Past Medical History:  Diagnosis Date   Ankle impingement syndrome    Right ankle from bone spur   Antiphospholipid antibody syndrome ([place]) 06/12/2012   Coagulopathy ([place]) 06/12/2012   DVT (deep venous thrombosis) ([place]) 1990, 2002   chronic hypercoagulobility.    DVT, recurrent, lower extremity, chronic 06/12/2012   1992   Dysrhythmia    AF   GERD (gastroesophageal reflux disease)    H/O tinnitus    Heterozygous factor V Leiden mutation ([place]) 06/12/2012   Hyperlipidemia    Migraine headache    Protein S deficiency ([place]) 06/12/2012   Prothrombin gene mutation ([place]) 06/12/2012   Pulmonary embolism ([place])    Pulmonary embolism, bilateral ([place]) 06/12/2012   July, 2002   PVC (premature ventricular contraction)    Varicose veins 06/12/2012   RLE   Wears hearing aid     Past Surgical History:  Procedure Laterality Date   ABDOMINAL AORTIC ANEURYSM REPAIR  12/14   [facility]   ANKLE SURGERY  2005   right   APPENDECTOMY     ARTERY BIOPSY Left 08/12/2014   Procedure: LEFT TEMPORAL ARTERY BIOPSY ;  Surgeon: Coralie Keens, MD;  Location: [facility];  Service:  General;  Laterality: Left;   CHOLECYSTECTOMY  2008   lap choli   COLONOSCOPY N/A 09/03/2013   Procedure: COLONOSCOPY;  Surgeon: Garlan Fair, MD;  [facility];  Service: Endoscopy;  Laterality: N/A;   FINGER SURGERY     Right 1st finger,  by Dr. Kalman Shan TENDON REPAIR  2013   left 5th finger by Dr. Caralyn Guile   INCISION AND DRAINAGE HIP Left 02/17/2021   Procedure: IRRIGATION AND DEBRIDEMENT LEFT THIGH;  Surgeon: Dorna Leitz, MD;  [facility];  Service: Orthopedics;  Laterality: Left;   PATELLA RELEASE AND MANIPULATION  1992   Right Patella    There were no vitals filed for this visit.   Subjective Assessment - 09/22/21 1405     Subjective Was able to do things around the house without a HA. Took a walk outside today 1/4 mile in 10 min using walking sticks. Using RW in case he gets up in the middle of the night. His fitness regimen includes elliptical, walking, and resistance exercises.    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    Currently in Pain? No/denies                               [facility] Adult PT Treatment/Exercise - 09/22/21 0001       Neuro Re-ed    Neuro Re-ed Details  gait 4x 57ft + throwing cathcing ball + gaze stability, searching for cones around clinic, hip hinge to 5x 8+6" step, with green medball 5x, to 8" step 5x; R/L forearm prop + gaze stabilization 5x EO, 5x EC, 5x EO sitting on dynadisc; D2 flexion with green medball to elevated cone 5x each; very slow gait + very slow head turn with CGA-min A 2x75ft   c/o increased dizziness with EC and with gait + head turns            Vestibular Treatment/Exercise - 09/22/21 0001       X1 Viewing Horizontal   Foot Position romberg    Reps 10    Comments c/o lsight wooziness and  no HA      X1 Viewing Vertical   Foot Position romberg    Reps 10    Comments c/o lsight wooziness and no HA                    PT Education - 09/22/21 1626     Education Details edu on resuming sitting aerobic machines and seated resistance exercises, however avoiding bench press and dead lifting at heavy weights; update to HEP-Access Code: [email or web address]    Person(s) Educated Patient    Methods Explanation;Demonstration;Tactile cues;Handout;Verbal cues    Comprehension Verbalized understanding;Returned demonstration              PT Short Term Goals - 09/20/21 1416       PT SHORT TERM GOAL #1   Title The patient will be indep with HEP for gaze adaptation, habituation, balance, and general mobility.    Time 4    Period Weeks    Status Achieved    Target Date 10/03/21      PT SHORT TERM GOAL #2   Title The patient will be able to move sit<>stand 5 reps without UE use, and without use of RW mod indep.    Time 4    Period Weeks    Status Achieved    Target Date 10/03/21      PT SHORT TERM GOAL #3   Title The patient will ambulate mod indep with RW in home and community x 300 ft without loss of balance.    Baseline *The patient reports he was safe with the walker and has progressed to no device -- intermittent UE support on walls/furniture 09/13/21.    Time 4    Period Weeks    Status Achieved    Target Date 10/03/21      PT SHORT TERM GOAL #4   Title The patient will tolerate gaze adaptation x 1 viewing x 30 seconds in seated or standing position.    Time 4    Period Weeks    Status On-going    Target Date 10/03/21      PT SHORT TERM GOAL #5   Title The patient will demonstrate bed mobility with dizziness < or equal to 2/10.    Baseline reports falling posteriorly with sit>supine.    Time 4    Period Weeks    Status Achieved    Target Date 10/03/21  PT SHORT TERM GOAL #6   Title PT to further assess Berg and/or FGA when appropriate.    Time 4     Period Weeks    Status Achieved    Target Date 10/03/21               PT Long Term Goals - 09/10/21 1158       PT LONG TERM GOAL #1   Title The patient will return to modified gym routine.    Time 8    Period Weeks    Target Date 11/02/21      PT LONG TERM GOAL #2   Title The patient will ambulate without an assistive device for household and community distances independently >500 ft.    Time 8    Period Weeks    Target Date 11/02/21      PT LONG TERM GOAL #3   Title The patient will tolerate gaze x 1 adaptation x 60 seconds without c/o dizziness.    Time 8    Period Weeks    Target Date 11/02/21      PT LONG TERM GOAL #4   Title The patient will improve Berg balance testing by 8 points.    Baseline 44/56    Time 8    Period Weeks    Target Date 11/02/21      PT LONG TERM GOAL #5   Title The patient will improve gait speed to > or equal to 2.7 ft/sec demonstrating dec'd fall risk and return to full community ambulator classification of gait.    Time 8    Period Weeks    Target Date 11/02/21                   Plan - 09/22/21 1628     Clinical Impression Statement Patient reports resumption of walking for fitness for short durations. Spoke to patient about resuming sitting aerobic machines and seated resistance exercises, however avoiding bench press and dead lifting at heavy weights. Worked on dynamic balance tasks with head movements and bending with c/o high dizziness at 2/10. Able to progress VOR to Romberg positioning and resumed 10 rather than 5 reps. Also able to progress habituation with EC. Dynamic functional activities to simulate workouts were performed with CGA and good stability. Most difficulty was demonstrated with gait with head turns today, as patient reported 4-5/10 dizziness and with significant staggering. Updated HEP with exercises that were well-tolerated today. Patient reported understanding and without complaints upon leaving.     Comorbidities prior h/o R sensorineural hearing loss (unsure if vestibular nerve impairment), h/o clotting disorder, L hearing absent    PT Treatment/Interventions ADLs/Self Care Home Management;Patient/family education;Neuromuscular re-education;Balance training;Therapeutic exercise;Therapeutic activities;Functional mobility training;Gait training;Stair training;Manual techniques;Vestibular    PT Next Visit Plan Check HEP and progess VOR reps if able to tolerate, standing balance in corner.   unlevel surface negotiation/ community gait for return to walking, and begin to work on return to gym routine.    Consulted and Agree with Plan of Care Patient             Patient will benefit from skilled therapeutic intervention in order to improve the following deficits and impairments:  Dizziness, Abnormal gait, Difficulty walking, Decreased activity tolerance, Decreased balance, Decreased mobility, Postural dysfunction, Impaired vision/preception  Visit Diagnosis: Dizziness and giddiness  Unsteadiness on feet  Other abnormalities of gait and mobility     Problem List Patient Active Problem List   Diagnosis Date  Noted   Vertigo 08/28/2021   PAF (paroxysmal atrial fibrillation) ([place]) 08/28/2021   Hematoma of left thigh 02/17/2021   Elevated LFTs    Acute epigastric pain 02/04/2017   HTN (hypertension) 09/27/2013   SNHL (sensorineural hearing loss) 05/24/2013   CAD (coronary artery disease) 04/18/2013   Chronic anticoagulation 04/18/2013   GERD (gastroesophageal reflux disease) 04/18/2013   History of pulmonary embolism 04/18/2013   Blood clotting disorder ([place]) 06/12/2012   Heterozygous factor V Leiden mutation ([place]) 06/12/2012   Prothrombin gene mutation ([place]) 06/12/2012   Antiphospholipid antibody syndrome ([place]) 06/12/2012   Protein S deficiency ([place]) 06/12/2012   DVT, recurrent, lower extremity, chronic 06/12/2012   Pulmonary embolism, bilateral ([place]) 06/12/2012   Varicose  veins 06/12/2012   Chronic venous embolism and thrombosis of deep vessels of lower extremity ([place]) 06/12/2012   Primary hypercoagulable state ([place]) 06/12/2012   Aneurysm of abdominal vessel 03/12/2012   Dissection of aorta, abdominal ([place]) 03/12/2012   ABNORMALITY OF GAIT 07/02/2009   SPRAIN&STRAIN OTHER SPECIFIED SITES KNEE&LEG 07/02/2009     Janene Harvey, PT, DPT 09/22/21 4:32 PM   [facility] [address] [place], [place], [address] Phone: [phone]   Fax:  [phone]  Name: Welborn Keena MRN: [id number] Date of Birth: July 25, 1951

## 2021-09-27 ENCOUNTER — Ambulatory Visit: Payer: Medicare Other | Admitting: Rehabilitative and Restorative Service Providers"

## 2021-09-27 ENCOUNTER — Other Ambulatory Visit: Payer: Self-pay

## 2021-09-27 DIAGNOSIS — R2681 Unsteadiness on feet: Secondary | ICD-10-CM

## 2021-09-27 DIAGNOSIS — R42 Dizziness and giddiness: Secondary | ICD-10-CM | POA: Diagnosis not present

## 2021-09-27 DIAGNOSIS — R2689 Other abnormalities of gait and mobility: Secondary | ICD-10-CM

## 2021-09-27 NOTE — Therapy (Signed)
[facility] [address] [place], [place], [address] Phone: [phone]   Fax:  [phone]  Physical Therapy Treatment  Patient Details  Name: Don Ruiz MRN: [id number] Date of Birth: May 31, 1951 Referring Provider (PT): Gerlean Ren MD is hospitalist/ Dr. Marlou Porch MD is primary care   Encounter Date: 09/27/2021   PT End of Session - 09/27/21 1441     Visit Number 7    Number of Visits 17    Date for PT Re-Evaluation 11/02/21    Authorization Type UHC Medicare    Progress Note Due on Visit 10    PT Start Time 1400    PT Stop Time 1442    PT Time Calculation (min) 42 min    Equipment Utilized During Treatment Gait belt    Activity Tolerance Patient tolerated treatment well    Behavior During Therapy [facility] for tasks assessed/performed             Past Medical History:  Diagnosis Date   Ankle impingement syndrome    Right ankle from bone spur   Antiphospholipid antibody syndrome ([place]) 06/12/2012   Coagulopathy ([place]) 06/12/2012   DVT (deep venous thrombosis) ([place]) 1990, 2002   chronic hypercoagulobility.    DVT, recurrent, lower extremity, chronic 06/12/2012   1992   Dysrhythmia    AF   GERD (gastroesophageal reflux disease)    H/O tinnitus    Heterozygous factor V Leiden mutation ([place]) 06/12/2012   Hyperlipidemia    Migraine headache    Protein S deficiency ([place]) 06/12/2012   Prothrombin gene mutation ([place]) 06/12/2012   Pulmonary embolism ([place])    Pulmonary embolism, bilateral ([place]) 06/12/2012   July, 2002   PVC (premature ventricular contraction)    Varicose veins 06/12/2012   RLE   Wears hearing aid     Past Surgical History:  Procedure Laterality Date   ABDOMINAL AORTIC ANEURYSM REPAIR  12/14   [facility]   ANKLE SURGERY  2005   right   APPENDECTOMY     ARTERY BIOPSY Left 08/12/2014   Procedure: LEFT TEMPORAL ARTERY BIOPSY ;  Surgeon: Coralie Keens, MD;  Location: [facility];  Service:  General;  Laterality: Left;   CHOLECYSTECTOMY  2008   lap choli   COLONOSCOPY N/A 09/03/2013   Procedure: COLONOSCOPY;  Surgeon: Garlan Fair, MD;  Location: [facility];  Service: Endoscopy;  Laterality: N/A;   FINGER SURGERY     Right 1st finger,  by Dr. Kalman Shan TENDON REPAIR  2013   left 5th finger by Dr. Caralyn Guile   INCISION AND DRAINAGE HIP Left 02/17/2021   Procedure: IRRIGATION AND DEBRIDEMENT LEFT THIGH;  Surgeon: Dorna Leitz, MD;  Location: [facility];  Service: Orthopedics;  Laterality: Left;   PATELLA RELEASE AND MANIPULATION  1992   Right Patella    There were no vitals filed for this visit.   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  He may feel fatigue from returning to work yesterday.  He has an MRI on Feb 2 for pre-cochlear implant screening.  He is doing a short distance walk (1/8 mile).    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    Currently in Pain? No/denies                [facility] PT Assessment - 09/27/21 1410       Assessment   Medical Diagnosis R42 vertigo    Referring Provider (PT) Gerlean Ren MD is hospitalist/ Dr. Marlou Porch MD is primary care    Onset Date/Surgical Date 08/27/21                           [facility] Adult PT Treatment/Exercise - 09/27/21 1410       Ambulation/Gait   Ambulation/Gait Yes    Gait Comments gait in clinic with turns to move around objects; no loss of balance      Self-Care   Self-Care Other Self-Care Comments    Other Self-Care Comments  discussed continuing to progress walking to tolerance      Neuro Re-ed    Neuro Re-ed Details  Dynamic gait activities performing compliant surface standing, eyes  closed, head motion, marching with CGA for safety.  Standing on solid surface with narrowing base of support with eyes closed, then tandem with eyes open x 20 seconds each leg with intermittent UE Support.  360 and 180 degree turns.  Standing on rocker board with L lateral tilt (put mat under R side) maintaining balance at midline.             Vestibular Treatment/Exercise - 09/27/21 1426       Vestibular Treatment/Exercise   Vestibular Treatment Provided Gaze;Habituation    Habituation Exercises 180 degree Turns;360 degree Turns;Standing Horizontal Head Turns;Standing Vertical Head Turns    Gaze Exercises X1 Viewing Horizontal      Standing Horizontal Head Turns   Number of Reps  10    Symptom Description  5/10 dizziness on folded mat      Standing Vertical Head Turns   Number of Reps  3   x 2 sets   Symptom Description  on foam      180 degree Turns   Number of Reps  3    Symptom Description  in door frame for safety      360 degree Turns   Number of Reps  3    COMMENT provokes dizziness to left side      X1 Viewing Horizontal   Foot Position romberg    Comments 20 seconds nonstop                    PT Education - 09/27/21 1441     Education Details HEP progression    Person(s) Educated Patient    Methods Explanation;Demonstration;Handout    Comprehension Verbalized understanding;Returned demonstration              PT Short Term Goals - 09/27/21 1539       PT SHORT TERM GOAL #1   Title The patient will be indep with HEP for gaze adaptation, habituation, balance, and general mobility.    Time 4    Period Weeks    Status Achieved    Target Date 10/03/21      PT SHORT TERM GOAL #2   Title The patient will be able to move sit<>stand 5 reps without UE use, and without use of RW mod indep.    Time 4    Period Weeks    Status Achieved    Target Date 10/03/21      PT SHORT TERM GOAL #  3   Title The patient will ambulate mod indep with RW in home  and community x 300 ft without loss of balance.    Baseline *The patient reports he was safe with the walker and has progressed to no device -- intermittent UE support on walls/furniture 09/13/21.    Time 4    Period Weeks    Status Achieved    Target Date 10/03/21      PT SHORT TERM GOAL #4   Title The patient will tolerate gaze adaptation x 1 viewing x 30 seconds in seated or standing position.    Baseline tolerated 20 seconds today 09/27/21    Time 4    Period Weeks    Status Partially Met    Target Date 10/03/21      PT SHORT TERM GOAL #5   Title The patient will demonstrate bed mobility with dizziness < or equal to 2/10.    Baseline reports falling posteriorly with sit>supine.    Time 4    Period Weeks    Status Achieved    Target Date 10/03/21      PT SHORT TERM GOAL #6   Title PT to further assess Berg and/or FGA when appropriate.    Time 4    Period Weeks    Status Achieved    Target Date 10/03/21               PT Long Term Goals - 09/10/21 1158       PT LONG TERM GOAL #1   Title The patient will return to modified gym routine.    Time 8    Period Weeks    Target Date 11/02/21      PT LONG TERM GOAL #2   Title The patient will ambulate without an assistive device for household and community distances independently >500 ft.    Time 8    Period Weeks    Target Date 11/02/21      PT LONG TERM GOAL #3   Title The patient will tolerate gaze x 1 adaptation x 60 seconds without c/o dizziness.    Time 8    Period Weeks    Target Date 11/02/21      PT LONG TERM GOAL #4   Title The patient will improve Berg balance testing by 8 points.    Baseline 44/56    Time 8    Period Weeks    Target Date 11/02/21      PT LONG TERM GOAL #5   Title The patient will improve gait speed to > or equal to 2.7 ft/sec demonstrating dec'd fall risk and return to full community ambulator classification of gait.    Time 8    Period Weeks    Target Date 11/02/21                    Plan - 09/27/21 1544     Clinical Impression Statement The patient has met all STGs except for gaze adaptation exercise goal for 30 seconds.  He is tolerating motion with bed mobility without c/o dizziness.  PT modified HEP to progress difficulty of eyes closed and work on narrowing base of suppport + increasing gaze adaptation time.  Plan to continue to work to [employer].    PT Treatment/Interventions ADLs/Self Care Home Management;Patient/family education;Neuromuscular re-education;Balance training;Therapeutic exercise;Therapeutic activities;Functional mobility training;Gait training;Stair training;Manual techniques;Vestibular    PT Next Visit Plan Check HEP and progess VOR reps if able to tolerate, standing  balance in corner.   unlevel surface negotiation/ community gait for return to walking, and begin to work on return to gym routine.    Consulted and Agree with Plan of Care Patient             Patient will benefit from skilled therapeutic intervention in order to improve the following deficits and impairments:     Visit Diagnosis: Dizziness and giddiness  Unsteadiness on feet  Other abnormalities of gait and mobility     Problem List Patient Active Problem List   Diagnosis Date Noted   Vertigo 08/28/2021   PAF (paroxysmal atrial fibrillation) ([place]) 08/28/2021   Hematoma of left thigh 02/17/2021   Elevated LFTs    Acute epigastric pain 02/04/2017   HTN (hypertension) 09/27/2013   SNHL (sensorineural hearing loss) 05/24/2013   CAD (coronary artery disease) 04/18/2013   Chronic anticoagulation 04/18/2013   GERD (gastroesophageal reflux disease) 04/18/2013   History of pulmonary embolism 04/18/2013   Blood clotting disorder ([place]) 06/12/2012   Heterozygous factor V Leiden mutation ([place]) 06/12/2012   Prothrombin gene mutation ([place]) 06/12/2012   Antiphospholipid antibody syndrome ([place]) 06/12/2012   Protein S deficiency ([place]) 06/12/2012   DVT, recurrent, lower  extremity, chronic 06/12/2012   Pulmonary embolism, bilateral ([place]) 06/12/2012   Varicose veins 06/12/2012   Chronic venous embolism and thrombosis of deep vessels of lower extremity ([place]) 06/12/2012   Primary hypercoagulable state ([place]) 06/12/2012   Aneurysm of abdominal vessel 03/12/2012   Dissection of aorta, abdominal ([place]) 03/12/2012   ABNORMALITY OF GAIT 07/02/2009   SPRAIN&STRAIN OTHER SPECIFIED SITES KNEE&LEG 07/02/2009    Nyaisha Simao, PT 09/27/2021, 3:45 PM  [facility] [address], [place], [place], [address] Phone: [phone]   Fax:  [phone]  Name: Don Ruiz MRN: [id number] Date of Birth: [birth year]

## 2021-09-27 NOTE — Patient Instructions (Signed)
Access Code: [email or web address] URL: [email or web address] Date: 09/27/2021 Prepared by: Rudell Cobb  Program Notes RULES FOR EXERCISE:  Don't let symptoms go above a 5/10 and if symptoms last > 10 minutes after exercises, do less reps.   Exercises Single Leg Stance - 2 x daily - 7 x weekly - 1 sets - 3 reps - 10-15 seconds hold Tandem Stance with Eyes Closed - 2 x daily - 7 x weekly - 1 sets - 3 reps - 15 seconds hold Standing Gaze Stabilization with Head Rotation - 1 x daily - 5 x weekly - 2 sets - 1 reps - work from 20 seconds up to 30 seconds hold Standing Gaze Stabilization with Head Nod - 1 x daily - 5 x weekly - 2-3 sets - 1 reps - work from 20 seconds up to 30 seconds hold Half turn in place - 2 x daily - 7 x weekly - 1 sets - 3 reps

## 2021-09-29 ENCOUNTER — Encounter: Payer: Self-pay | Admitting: Physical Therapy

## 2021-09-29 ENCOUNTER — Other Ambulatory Visit: Payer: Self-pay

## 2021-09-29 ENCOUNTER — Ambulatory Visit: Payer: Medicare Other | Admitting: Physical Therapy

## 2021-09-29 DIAGNOSIS — R42 Dizziness and giddiness: Secondary | ICD-10-CM

## 2021-09-29 DIAGNOSIS — R2681 Unsteadiness on feet: Secondary | ICD-10-CM

## 2021-09-29 DIAGNOSIS — R2689 Other abnormalities of gait and mobility: Secondary | ICD-10-CM

## 2021-09-29 NOTE — Therapy (Signed)
[facility] [address], [place], [place], [address] Phone: [phone]   Fax:  [phone]  Physical Therapy Treatment  Patient Details  Name: Don Ruiz MRN: [id number] Date of Birth: Aug 08, 1951 Referring Provider (PT): Gerlean Ren MD is hospitalist/ Dr. Marlou Porch MD is primary care   Encounter Date: 09/29/2021   PT End of Session - 09/29/21 1528     Visit Number 8    Number of Visits 17    Date for PT Re-Evaluation 11/02/21    Authorization Type UHC Medicare    Progress Note Due on Visit 10    PT Start Time 1446    PT Stop Time 1524    PT Time Calculation (min) 38 min    Equipment Utilized During Treatment Gait belt    Activity Tolerance Patient tolerated treatment well    Behavior During Therapy [facility] for tasks assessed/performed             Past Medical History:  Diagnosis Date   Ankle impingement syndrome    Right ankle from bone spur   Antiphospholipid antibody syndrome ([place]) 06/12/2012   Coagulopathy ([place]) 06/12/2012   DVT (deep venous thrombosis) ([place]) 1990, 2002   chronic hypercoagulobility.    DVT, recurrent, lower extremity, chronic 06/12/2012   1992   Dysrhythmia    AF   GERD (gastroesophageal reflux disease)    H/O tinnitus    Heterozygous factor V Leiden mutation ([place]) 06/12/2012   Hyperlipidemia    Migraine headache    Protein S deficiency ([place]) 06/12/2012   Prothrombin gene mutation ([place]) 06/12/2012   Pulmonary embolism ([place])    Pulmonary embolism, bilateral ([place]) 06/12/2012   July, 2002   PVC (premature ventricular contraction)    Varicose veins 06/12/2012   RLE   Wears hearing aid     Past Surgical History:  Procedure Laterality Date   ABDOMINAL AORTIC ANEURYSM REPAIR  12/14   [facility]   ANKLE SURGERY  2005   right   APPENDECTOMY     ARTERY BIOPSY Left 08/12/2014   Procedure: LEFT TEMPORAL ARTERY BIOPSY ;  Surgeon: Coralie Keens, MD;  Location: [facility];  Service:  General;  Laterality: Left;   CHOLECYSTECTOMY  2008   lap choli   COLONOSCOPY N/A 09/03/2013   Procedure: COLONOSCOPY;  Surgeon: Garlan Fair, MD;  [facility];  Service: Endoscopy;  Laterality: N/A;   FINGER SURGERY     Right 1st finger,  by Dr. Kalman Shan TENDON REPAIR  2013   left 5th finger by Dr. Caralyn Guile   INCISION AND DRAINAGE HIP Left 02/17/2021   Procedure: IRRIGATION AND DEBRIDEMENT LEFT THIGH;  Surgeon: Dorna Leitz, MD;  [facility];  Service: Orthopedics;  Laterality: Left;   PATELLA RELEASE AND MANIPULATION  1992   Right Patella    There were no vitals filed for this visit.   Subjective Assessment - 09/29/21 1445     Subjective Walked a mild yesterday- did not feel as wobbly and able to keep a good tempo. Still has feeling or disorientation with bending does not feel like it's leaving.    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    Currently in Pain? No/denies  [facility] Adult PT Treatment/Exercise - 09/29/21 0001       Neuro Re-ed    Neuro Re-ed Details  straight leg bridge on pball 10x, laying on vertical foam roll alt shoulder flexion, marching with wide and narrow foot position; sitting on blue pball graze stabilization with bounce 30", + perturbations 30", VOR horizontal/vertical 20", VOR with additional perturbations 20"; R/L single leg RDL to elevated mat 10x; R/L forearm prop + gaze stability sitting on dynadisc 10x EO, 2x10 EC             Vestibular Treatment/Exercise - 09/29/21 0001       X1 Viewing Horizontal   Foot Position marching    Reps --   2x10   Comments 2/10 dizziness      X1 Viewing Vertical   Foot Position marching    Reps --   2x10   Comments 2/10 dizziness                     PT Education - 09/29/21 1525     Education Details updaye to HEP-Access Code: [email or web address]    Person(s) Educated Patient    Methods Tactile cues;Explanation;Demonstration;Verbal cues;Handout    Comprehension Verbalized understanding;Returned demonstration              PT Short Term Goals - 09/27/21 1539       PT SHORT TERM GOAL #1   Title The patient will be indep with HEP for gaze adaptation, habituation, balance, and general mobility.    Time 4    Period Weeks    Status Achieved    Target Date 10/03/21      PT SHORT TERM GOAL #2   Title The patient will be able to move sit<>stand 5 reps without UE use, and without use of RW mod indep.    Time 4    Period Weeks    Status Achieved    Target Date 10/03/21      PT SHORT TERM GOAL #3   Title The patient will ambulate mod indep with RW in home and community x 300 ft without loss of balance.    Baseline *The patient reports he was safe with the walker and has progressed to no device -- intermittent UE support on walls/furniture 09/13/21.    Time 4    Period Weeks    Status Achieved    Target Date 10/03/21      PT SHORT TERM GOAL #4   Title The patient will tolerate gaze adaptation x 1 viewing x 30 seconds in seated or standing position.    Baseline tolerated 20 seconds today 09/27/21    Time 4    Period Weeks    Status Partially Met    Target Date 10/03/21      PT SHORT TERM GOAL #5   Title The patient will demonstrate bed mobility with dizziness < or equal to 2/10.    Baseline reports falling posteriorly with sit>supine.    Time 4    Period Weeks    Status Achieved    Target Date 10/03/21      PT SHORT TERM GOAL #6   Title PT to further assess Berg and/or FGA when appropriate.    Time 4    Period Weeks    Status Achieved    Target Date 10/03/21               PT Long Term Goals - 09/10/21 1158  PT LONG TERM GOAL #1   Title The patient will return to modified gym routine.    Time 8     Period Weeks    Target Date 11/02/21      PT LONG TERM GOAL #2   Title The patient will ambulate without an assistive device for household and community distances independently >500 ft.    Time 8    Period Weeks    Target Date 11/02/21      PT LONG TERM GOAL #3   Title The patient will tolerate gaze x 1 adaptation x 60 seconds without c/o dizziness.    Time 8    Period Weeks    Target Date 11/02/21      PT LONG TERM GOAL #4   Title The patient will improve Berg balance testing by 8 points.    Baseline 44/56    Time 8    Period Weeks    Target Date 11/02/21      PT LONG TERM GOAL #5   Title The patient will improve gait speed to > or equal to 2.7 ft/sec demonstrating dec'd fall risk and return to full community ambulator classification of gait.    Time 8    Period Weeks    Target Date 11/02/21                   Plan - 09/29/21 1528     Clinical Impression Statement Patient arrived to session with report of tolerating a 1 mile walk recently, however still noticing disorientation with bending.  Worked on vestibular activities to simulate patients workout activities. Provided challenges including compliant surfaces and perturbations to increase patients symptoms. Patient noted 1-3/10 dizziness throughout session and able to resolve with sitting rest break. Able to progress VOR activities with marching while still keeping low reps; mild-moderate instability and mild dizziness noted. Updated HEP to further challenge balance and motion sensitivity. Patient reported understanding and without complaints upon leaving.    Comorbidities prior h/o R sensorineural hearing loss (unsure if vestibular nerve impairment), h/o clotting disorder, L hearing absent    PT Treatment/Interventions ADLs/Self Care Home Management;Patient/family education;Neuromuscular re-education;Balance training;Therapeutic exercise;Therapeutic activities;Functional mobility training;Gait training;Stair  training;Manual techniques;Vestibular    PT Next Visit Plan Check HEP and progess VOR reps if able to tolerate, standing balance in corner.   unlevel surface negotiation/ community gait for return to walking, and begin to work on return to gym routine.    Consulted and Agree with Plan of Care Patient             Patient will benefit from skilled therapeutic intervention in order to improve the following deficits and impairments:  Dizziness, Abnormal gait, Difficulty walking, Decreased activity tolerance, Decreased balance, Decreased mobility, Postural dysfunction, Impaired vision/preception  Visit Diagnosis: Dizziness and giddiness  Unsteadiness on feet  Other abnormalities of gait and mobility     Problem List Patient Active Problem List   Diagnosis Date Noted   Vertigo 08/28/2021   PAF (paroxysmal atrial fibrillation) ([place]) 08/28/2021   Hematoma of left thigh 02/17/2021   Elevated LFTs    Acute epigastric pain 02/04/2017   HTN (hypertension) 09/27/2013   SNHL (sensorineural hearing loss) 05/24/2013   CAD (coronary artery disease) 04/18/2013   Chronic anticoagulation 04/18/2013   GERD (gastroesophageal reflux disease) 04/18/2013   History of pulmonary embolism 04/18/2013   Blood clotting disorder ([place]) 06/12/2012   Heterozygous factor V Leiden mutation ([place]) 06/12/2012   Prothrombin gene mutation ([place]) 06/12/2012  Antiphospholipid antibody syndrome ([place]) 06/12/2012   Protein S deficiency ([place]) 06/12/2012   DVT, recurrent, lower extremity, chronic 06/12/2012   Pulmonary embolism, bilateral ([place]) 06/12/2012   Varicose veins 06/12/2012   Chronic venous embolism and thrombosis of deep vessels of lower extremity ([place]) 06/12/2012   Primary hypercoagulable state ([place]) 06/12/2012   Aneurysm of abdominal vessel 03/12/2012   Dissection of aorta, abdominal ([place]) 03/12/2012   ABNORMALITY OF GAIT 07/02/2009   SPRAIN&STRAIN OTHER SPECIFIED SITES KNEE&LEG 07/02/2009     Janene Harvey, PT, DPT 09/29/21 3:31 PM   [facility] [address] [place], [place], [address] Phone: [phone]   Fax:  [phone]  Name: Don Ruiz MRN: [id number] Date of Birth: Jun 20, 1951

## 2021-10-05 ENCOUNTER — Encounter: Payer: Self-pay | Admitting: Physical Therapy

## 2021-10-05 ENCOUNTER — Other Ambulatory Visit: Payer: Self-pay

## 2021-10-05 ENCOUNTER — Ambulatory Visit: Payer: Medicare Other | Admitting: Physical Therapy

## 2021-10-05 DIAGNOSIS — R42 Dizziness and giddiness: Secondary | ICD-10-CM

## 2021-10-05 DIAGNOSIS — R2689 Other abnormalities of gait and mobility: Secondary | ICD-10-CM

## 2021-10-05 DIAGNOSIS — R2681 Unsteadiness on feet: Secondary | ICD-10-CM

## 2021-10-05 NOTE — Therapy (Signed)
[facility] [address], [place], [place], [address] Phone: [phone]   Fax:  [phone]  Physical Therapy Treatment  Patient Details  Name: Don Ruiz MRN: [id number] Date of Birth: 1951/02/13 Referring Provider (PT): Gerlean Ren MD is hospitalist/ Dr. Marlou Porch MD is primary care   Encounter Date: 10/05/2021   PT End of Session - 10/05/21 1703     Visit Number 9    Number of Visits 17    Date for PT Re-Evaluation 11/02/21    Authorization Type UHC Medicare    Progress Note Due on Visit 10    PT Start Time 1615    PT Stop Time 1655    PT Time Calculation (min) 40 min    Equipment Utilized During Treatment Gait belt    Activity Tolerance Patient tolerated treatment well    Behavior During Therapy [facility] for tasks assessed/performed             Past Medical History:  Diagnosis Date   Ankle impingement syndrome    Right ankle from bone spur   Antiphospholipid antibody syndrome ([place]) 06/12/2012   Coagulopathy ([place]) 06/12/2012   DVT (deep venous thrombosis) ([place]) 1990, 2002   chronic hypercoagulobility.    DVT, recurrent, lower extremity, chronic 06/12/2012   1992   Dysrhythmia    AF   GERD (gastroesophageal reflux disease)    H/O tinnitus    Heterozygous factor V Leiden mutation ([place]) 06/12/2012   Hyperlipidemia    Migraine headache    Protein S deficiency ([place]) 06/12/2012   Prothrombin gene mutation ([place]) 06/12/2012   Pulmonary embolism ([place])    Pulmonary embolism, bilateral ([place]) 06/12/2012   July, 2002   PVC (premature ventricular contraction)    Varicose veins 06/12/2012   RLE   Wears hearing aid     Past Surgical History:  Procedure Laterality Date   ABDOMINAL AORTIC ANEURYSM REPAIR  12/14   [facility]   ANKLE SURGERY  2005   right   APPENDECTOMY     ARTERY BIOPSY Left 08/12/2014   Procedure: LEFT TEMPORAL ARTERY BIOPSY ;  Surgeon: Coralie Keens, MD;  Location: [facility];  Service:  General;  Laterality: Left;   CHOLECYSTECTOMY  2008   lap choli   COLONOSCOPY N/A 09/03/2013   Procedure: COLONOSCOPY;  Surgeon: Garlan Fair, MD;  Location: [facility];  Service: Endoscopy;  Laterality: N/A;   FINGER SURGERY     Right 1st finger,  by Dr. Kalman Shan TENDON REPAIR  2013   left 5th finger by Dr. Caralyn Guile   INCISION AND DRAINAGE HIP Left 02/17/2021   Procedure: IRRIGATION AND DEBRIDEMENT LEFT THIGH;  Surgeon: Dorna Leitz, MD;  Location: [facility];  Service: Orthopedics;  Laterality: Left;   PATELLA RELEASE AND MANIPULATION  1992   Right Patella    There were no vitals filed for this visit.   Subjective Assessment - 10/05/21 1616     Subjective Able to walk 2 miles which was a little taxing. HEP is challenging but feels like it's getting better. Reports that he is able to tolerate 30 sec of VOR exercise.    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    Currently in Pain? No/denies  [facility] Adult PT Treatment/Exercise - 10/05/21 0001       Balance Poses: Yoga   Warrior I --   cat/cow 10x, quadruped to child's pose 10x, R/L thread the needle 5x each     Neuro Re-ed    Neuro Re-ed Details  R/L side lunges, walking lunges, with CGA, mini squat on foam with B UE support on handrail 2x10;             Vestibular Treatment/Exercise - 10/05/21 0001       X1 Viewing Horizontal   Foot Position romberg, romberg on foam    Reps --   30"   Comments 1/10 dizziness      X1 Viewing Vertical   Foot Position romberg, romberg on foam    Reps --   30"   Comments 2/10 dizziness   required taking off glasses               Balance Exercises - 10/05/21 0001       Balance Exercises: Standing   Wall Bumps Shoulder;Hip;Eyes  closed;10 reps                PT Education - 10/05/21 1703     Education Details update to HEP-Access Code: [email or web address]    Person(s) Educated Patient    Methods Explanation;Demonstration;Tactile cues;Verbal cues;Handout    Comprehension Verbalized understanding;Returned demonstration              PT Short Term Goals - 09/27/21 1539       PT SHORT TERM GOAL #1   Title The patient will be indep with HEP for gaze adaptation, habituation, balance, and general mobility.    Time 4    Period Weeks    Status Achieved    Target Date 10/03/21      PT SHORT TERM GOAL #2   Title The patient will be able to move sit<>stand 5 reps without UE use, and without use of RW mod indep.    Time 4    Period Weeks    Status Achieved    Target Date 10/03/21      PT SHORT TERM GOAL #3   Title The patient will ambulate mod indep with RW in home and community x 300 ft without loss of balance.    Baseline *The patient reports he was safe with the walker and has progressed to no device -- intermittent UE support on walls/furniture 09/13/21.    Time 4    Period Weeks    Status Achieved    Target Date 10/03/21      PT SHORT TERM GOAL #4   Title The patient will tolerate gaze adaptation x 1 viewing x 30 seconds in seated or standing position.    Baseline tolerated 20 seconds today 09/27/21    Time 4    Period Weeks    Status Partially Met    Target Date 10/03/21      PT SHORT TERM GOAL #5   Title The patient will demonstrate bed mobility with dizziness < or equal to 2/10.    Baseline reports falling posteriorly with sit>supine.    Time 4    Period Weeks    Status Achieved    Target Date 10/03/21      PT SHORT TERM GOAL #6   Title PT to further assess Berg and/or FGA when appropriate.    Time 4    Period Weeks    Status Achieved    Target Date 10/03/21  PT Long Term Goals - 09/10/21 1158       PT LONG TERM GOAL #1   Title The patient will return to modified gym  routine.    Time 8    Period Weeks    Target Date 11/02/21      PT LONG TERM GOAL #2   Title The patient will ambulate without an assistive device for household and community distances independently >500 ft.    Time 8    Period Weeks    Target Date 11/02/21      PT LONG TERM GOAL #3   Title The patient will tolerate gaze x 1 adaptation x 60 seconds without c/o dizziness.    Time 8    Period Weeks    Target Date 11/02/21      PT LONG TERM GOAL #4   Title The patient will improve Berg balance testing by 8 points.    Baseline 44/56    Time 8    Period Weeks    Target Date 11/02/21      PT LONG TERM GOAL #5   Title The patient will improve gait speed to > or equal to 2.7 ft/sec demonstrating dec'd fall risk and return to full community ambulator classification of gait.    Time 8    Period Weeks    Target Date 11/02/21                   Plan - 10/05/21 1704     Clinical Impression Statement Patient arrived to session with report of tolerating 2 mile walk with his wife since last session. Continues to report improved tolerance of HEP. Able to perform VOR in [place] and on foam surface. Patient consistently reports slightly more significant symptoms with vertical VOR. Worked on dynamic balance activities to simulate exercise activities. Patient required CGA for safety and notes sensation of spinning after stopping exercise, with dizziness ranging 3-5/10. Heavy cueing required to decrease speed and allow for increased rest breaks in between activities today to prevent compounding of symptoms.    Comorbidities prior h/o R sensorineural hearing loss (unsure if vestibular nerve impairment), h/o clotting disorder, L hearing absent    PT Treatment/Interventions ADLs/Self Care Home Management;Patient/family education;Neuromuscular re-education;Balance training;Therapeutic exercise;Therapeutic activities;Functional mobility training;Gait training;Stair training;Manual  techniques;Vestibular    PT Next Visit Plan Check HEP and progess VOR reps if able to tolerate, standing balance in corner.   unlevel surface negotiation/ community gait for return to walking, and begin to work on return to gym routine.    Consulted and Agree with Plan of Care Patient             Patient will benefit from skilled therapeutic intervention in order to improve the following deficits and impairments:  Dizziness, Abnormal gait, Difficulty walking, Decreased activity tolerance, Decreased balance, Decreased mobility, Postural dysfunction, Impaired vision/preception  Visit Diagnosis: Dizziness and giddiness  Unsteadiness on feet  Other abnormalities of gait and mobility     Problem List Patient Active Problem List   Diagnosis Date Noted   Vertigo 08/28/2021   PAF (paroxysmal atrial fibrillation) ([place]) 08/28/2021   Hematoma of left thigh 02/17/2021   Elevated LFTs    Acute epigastric pain 02/04/2017   HTN (hypertension) 09/27/2013   SNHL (sensorineural hearing loss) 05/24/2013   CAD (coronary artery disease) 04/18/2013   Chronic anticoagulation 04/18/2013   GERD (gastroesophageal reflux disease) 04/18/2013   History of pulmonary embolism 04/18/2013   Blood clotting disorder ([place]) 06/12/2012  Heterozygous factor V Leiden mutation ([place]) 06/12/2012   Prothrombin gene mutation ([place]) 06/12/2012   Antiphospholipid antibody syndrome ([place]) 06/12/2012   Protein S deficiency ([place]) 06/12/2012   DVT, recurrent, lower extremity, chronic 06/12/2012   Pulmonary embolism, bilateral ([place]) 06/12/2012   Varicose veins 06/12/2012   Chronic venous embolism and thrombosis of deep vessels of lower extremity ([place]) 06/12/2012   Primary hypercoagulable state ([place]) 06/12/2012   Aneurysm of abdominal vessel 03/12/2012   Dissection of aorta, abdominal ([place]) 03/12/2012   ABNORMALITY OF GAIT 07/02/2009   SPRAIN&STRAIN OTHER SPECIFIED SITES KNEE&LEG 07/02/2009    Janene Harvey, PT,  DPT 10/05/21 5:09 PM   [facility] [address], [place], [place], [address] Phone: [phone]   Fax:  [phone]  Name: Don Ruiz MRN: [id number] Date of Birth: [birth year]

## 2021-10-07 ENCOUNTER — Ambulatory Visit: Payer: 59 | Admitting: Physical Therapy

## 2021-10-08 ENCOUNTER — Other Ambulatory Visit: Payer: Self-pay

## 2021-10-08 ENCOUNTER — Ambulatory Visit: Payer: Medicare Other | Attending: Internal Medicine | Admitting: Rehabilitative and Restorative Service Providers"

## 2021-10-08 DIAGNOSIS — R2689 Other abnormalities of gait and mobility: Secondary | ICD-10-CM

## 2021-10-08 DIAGNOSIS — R2681 Unsteadiness on feet: Secondary | ICD-10-CM | POA: Diagnosis present

## 2021-10-08 DIAGNOSIS — R42 Dizziness and giddiness: Secondary | ICD-10-CM

## 2021-10-08 NOTE — Therapy (Addendum)
[facility] [address] [place], [place], [address] Phone: [phone]   Fax:  [phone]  Physical Therapy Treatment/ Progress note  Patient Details  Name: Don Ruiz MRN: [id number] Date of Birth: [birth year] Referring Provider (PT): Gerlean Ren MD is hospitalist/ Dr. Marlou Porch MD is primary care   Encounter Date: 10/08/2021  Progress Note Reporting Period 09/01/2021 to 10/08/2021  See note below for Objective Data and Assessment of Progress/Goals.       PT End of Session - 10/08/21 0851     Visit Number 10    Number of Visits 17    Date for PT Re-Evaluation 11/02/21    Authorization Type UHC Medicare    Progress Note Due on Visit 10    PT Start Time [phone]    PT Stop Time 0930    PT Time Calculation (min) 43 min    Equipment Utilized During Treatment Gait belt    Activity Tolerance Patient tolerated treatment well    Behavior During Therapy WFL for tasks assessed/performed             Past Medical History:  Diagnosis Date   Ankle impingement syndrome    Right ankle from bone spur   Antiphospholipid antibody syndrome ([place]) 06/12/2012   Coagulopathy ([place]) 06/12/2012   DVT (deep venous thrombosis) ([place]) 1990, 2002   chronic hypercoagulobility.    DVT, recurrent, lower extremity, chronic 06/12/2012   1992   Dysrhythmia    AF   GERD (gastroesophageal reflux disease)    H/O tinnitus    Heterozygous factor V Leiden mutation ([place]) 06/12/2012   Hyperlipidemia    Migraine headache    Protein S deficiency ([place]) 06/12/2012   Prothrombin gene mutation ([place]) 06/12/2012   Pulmonary embolism ([place])    Pulmonary embolism, bilateral ([place]) 06/12/2012   July, 2002   PVC (premature ventricular contraction)    Varicose veins 06/12/2012   RLE   Wears hearing aid     Past Surgical History:  Procedure Laterality Date   ABDOMINAL AORTIC ANEURYSM REPAIR  12/14   [facility]   ANKLE SURGERY  2005   right   APPENDECTOMY     ARTERY  BIOPSY Left 08/12/2014   Procedure: LEFT TEMPORAL ARTERY BIOPSY ;  Surgeon: Coralie Keens, MD;  Location: [facility];  Service: General;  Laterality: Left;   CHOLECYSTECTOMY  2008   lap choli   COLONOSCOPY N/A 09/03/2013   Procedure: COLONOSCOPY;  Surgeon: Garlan Fair, MD;  [facility];  Service: Endoscopy;  Laterality: N/A;   FINGER SURGERY     Right 1st finger,  by Dr. Kalman Shan TENDON REPAIR  2013   left 5th finger by Dr. Caralyn Guile   INCISION AND DRAINAGE HIP Left 02/17/2021   Procedure: IRRIGATION AND DEBRIDEMENT LEFT THIGH;  Surgeon: Dorna Leitz, MD;  Location: [facility];  Service: Orthopedics;  Laterality: Left;   PATELLA RELEASE AND MANIPULATION  1992   Right Patella    There were no vitals filed for this visit.   Subjective Assessment - 10/08/21 0849     Subjective The patient reports he had a long day yesterday at [facility] doing testing for hearing to determine if he is a candidate for a cochlear implant.  Sitting down and lunges bring on strange sensation in his head leading to imbalance.  He is no longer getting HA.  He is doing resistance bands and weights at home.    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    Currently in Pain? No/denies                [facility] PT Assessment - 10/08/21 0854       Assessment   Medical Diagnosis R42 vertigo    Referring Provider (PT) Gerlean Ren MD is hospitalist/ Dr. Marlou Porch MD is primary care    Onset Date/Surgical Date 08/27/21                           [facility] Adult PT Treatment/Exercise - 10/08/21 0855       Ambulation/Gait   Ambulation/Gait Yes    Ambulation/Gait Assistance 7: Independent    Gait Comments Dynamic gait activities performing walking with gaze x 1 in horizontal and  vertical planes x 40 feet each, ball toss R hand<>L hand.      Self-Care   Self-Care Other Self-Care Comments    Other Self-Care Comments  recommended trial of elliptical x 2-3 minutes at home-- discussed this may provoke sensation of visual oscilopsia that will need to be habituated.      Neuro Re-ed    Neuro Re-ed Details  Standing diagonals using green band moving R high to L lower positioning  and then reversed x 5 reps each with mild dizziness worse upon stopping.  Lunge> to marches x 10 reps R and L working on habituation of vertical movement + functional balance.  Partial heel/toe with ball toss, tandem with eyes closed, single leg stance with ball toss (unable to do without toe touch every rep).      Exercises   Exercises Other Exercises             Vestibular Treatment/Exercise - 10/08/21 0909       Vestibular Treatment/Exercise   Vestibular Treatment Provided Habituation;Gaze    Habituation Exercises Seated Vertical Head Turns;Standing Diagonal Head Turns    Gaze Exercises X1 Viewing Horizontal;X1 Viewing Vertical;X2 Viewing Horizontal;X2 Viewing Vertical      Seated Vertical Head Turns   Symptom Description  *long sitting with L rotation of neck to 30-40 degrees and then supine position x 5 reps with reports of visual bouncing and sensation of falling.      Standing Diagonal Head Turns   Symptiom Description  With NMR worked on balance, head motion with position changes x 5 reps each direction      X1 Viewing Horizontal   Foot Position romberg    Comments 45 seconds-- increased duration; performed with head at neutral and with slight L rotation      X1 Viewing Vertical   Foot Position romberg    Comments *L gaze to 30 degrees and then added vertical motion x 30 seconds.  First rep provokes significant imbalance, but improves with repetition.  Standing with head in neutral and Romberg position                    PT Education - 10/08/21 0925     Education  Details HEP    Person(s) Educated Patient    Methods Explanation;Demonstration;Handout    Comprehension Verbalized understanding;Returned demonstration              PT Short Term Goals - 09/27/21 1539  PT SHORT TERM GOAL #1   Title The patient will be indep with HEP for gaze adaptation, habituation, balance, and general mobility.    Time 4    Period Weeks    Status Achieved    Target Date 10/03/21      PT SHORT TERM GOAL #2   Title The patient will be able to move sit<>stand 5 reps without UE use, and without use of RW mod indep.    Time 4    Period Weeks    Status Achieved    Target Date 10/03/21      PT SHORT TERM GOAL #3   Title The patient will ambulate mod indep with RW in home and community x 300 ft without loss of balance.    Baseline *The patient reports he was safe with the walker and has progressed to no device -- intermittent UE support on walls/furniture 09/13/21.    Time 4    Period Weeks    Status Achieved    Target Date 10/03/21      PT SHORT TERM GOAL #4   Title The patient will tolerate gaze adaptation x 1 viewing x 30 seconds in seated or standing position.    Baseline tolerated 20 seconds today 09/27/21    Time 4    Period Weeks    Status Partially Met    Target Date 10/03/21      PT SHORT TERM GOAL #5   Title The patient will demonstrate bed mobility with dizziness < or equal to 2/10.    Baseline reports falling posteriorly with sit>supine.    Time 4    Period Weeks    Status Achieved    Target Date 10/03/21      PT SHORT TERM GOAL #6   Title PT to further assess Berg and/or FGA when appropriate.    Time 4    Period Weeks    Status Achieved    Target Date 10/03/21               PT Long Term Goals - 09/10/21 1158       PT LONG TERM GOAL #1   Title The patient will return to modified gym routine.    Time 8    Period Weeks    Target Date 11/02/21      PT LONG TERM GOAL #2   Title The patient will ambulate without an  assistive device for household and community distances independently >500 ft.    Time 8    Period Weeks    Target Date 11/02/21      PT LONG TERM GOAL #3   Title The patient will tolerate gaze x 1 adaptation x 60 seconds without c/o dizziness.    Time 8    Period Weeks    Target Date 11/02/21      PT LONG TERM GOAL #4   Title The patient will improve Berg balance testing by 8 points.    Baseline 44/56    Time 8    Period Weeks    Target Date 11/02/21      PT LONG TERM GOAL #5   Title The patient will improve gait speed to > or equal to 2.7 ft/sec demonstrating dec'd fall risk and return to full community ambulator classification of gait.    Time 8    Period Weeks    Target Date 11/02/21  Plan - 10/08/21 1245     Clinical Impression Statement The patient has met all STGs and is working towards [place].  He has returned to outdoor ambulation without a device.  He continues to have difficulty with sudden movements of his head/body in space, notes continued oscillopscia during stair negotiation or moving stand>sit, and dec'd gaze stability during head motion worse when in L rotation.  PT is continuing to work on habituation, balance, gaze adaptation, and return to prior functional status. The patient will benefit from continued PT to address these deficits.    PT Frequency 2x / week    PT Duration 8 weeks    PT Treatment/Interventions ADLs/Self Care Home Management;Patient/family education;Neuromuscular re-education;Balance training;Therapeutic exercise;Therapeutic activities;Functional mobility training;Gait training;Stair training;Manual techniques;Vestibular    PT Next Visit Plan Progess HEP to tolerance, VOR in left rotation for horiz/vertical, habituation long sit>L head rotation to supine, progress functional standing balance and work on return to gym routine.    Consulted and Agree with Plan of Care Patient             Patient will benefit from  skilled therapeutic intervention in order to improve the following deficits and impairments:  Dizziness, Abnormal gait, Difficulty walking, Decreased activity tolerance, Decreased balance, Decreased mobility, Postural dysfunction, Impaired vision/preception  Visit Diagnosis: Dizziness and giddiness  Unsteadiness on feet  Other abnormalities of gait and mobility     Problem List Patient Active Problem List   Diagnosis Date Noted   Vertigo 08/28/2021   PAF (paroxysmal atrial fibrillation) ([place]) 08/28/2021   Hematoma of left thigh 02/17/2021   Elevated LFTs    Acute epigastric pain 02/04/2017   HTN (hypertension) 09/27/2013   SNHL (sensorineural hearing loss) 05/24/2013   CAD (coronary artery disease) 04/18/2013   Chronic anticoagulation 04/18/2013   GERD (gastroesophageal reflux disease) 04/18/2013   History of pulmonary embolism 04/18/2013   Blood clotting disorder ([place]) 06/12/2012   Heterozygous factor V Leiden mutation ([place]) 06/12/2012   Prothrombin gene mutation ([place]) 06/12/2012   Antiphospholipid antibody syndrome ([place]) 06/12/2012   Protein S deficiency ([place]) 06/12/2012   DVT, recurrent, lower extremity, chronic 06/12/2012   Pulmonary embolism, bilateral ([place]) 06/12/2012   Varicose veins 06/12/2012   Chronic venous embolism and thrombosis of deep vessels of lower extremity ([place]) 06/12/2012   Primary hypercoagulable state ([place]) 06/12/2012   Aneurysm of abdominal vessel 03/12/2012   Dissection of aorta, abdominal ([place]) 03/12/2012   ABNORMALITY OF GAIT 07/02/2009   SPRAIN&STRAIN OTHER SPECIFIED SITES KNEE&LEG 07/02/2009    Don Ruiz, PT 10/08/2021, 12:53 PM  [facility] [address], [place], [place], [address] Phone: [phone]   Fax:  [phone]  Name: Don Ruiz MRN: [id number] Date of Birth: 10/25/1950

## 2021-10-08 NOTE — Patient Instructions (Signed)
Access Code: [email or web address] URL: [email or web address] Date: 10/08/2021 Prepared by: Rudell Cobb  Program Notes RULES FOR EXERCISE:  Don't let symptoms go above a 5/10 and if symptoms last > 10 minutes after exercises, do less reps.   Exercises Tandem Stance with Eyes Closed - 2 x daily - 7 x weekly - 1 sets - 3 reps - 15 seconds hold Standing Gaze Stabilization with Head Rotation - 1 x daily - 5 x weekly - 2 sets - 30-40 sec hold Standing Gaze Stabilization with Head Nod - 1 x daily - 5 x weekly - 2-3 sets - 30-40 sec hold Half turn in place - 2 x daily - 7 x weekly - 1 sets - 3 reps Forward T with Counter Support - 1 x daily - 5 x weekly - 2 sets - 10 reps Standing with Back Flat Against Wall - 1 x daily - 5 x weekly - 2 sets - 10 reps [employer] with Eye Tracking While Walking - 2 x daily - 7 x weekly - 1 sets - 10 reps [employer] with Eye Tracking Tandem Walking - 2 x daily - 7 x weekly - 1 sets - 10 reps

## 2021-10-12 ENCOUNTER — Ambulatory Visit: Payer: Medicare Other | Admitting: Physical Therapy

## 2021-10-12 ENCOUNTER — Encounter: Payer: Self-pay | Admitting: Physical Therapy

## 2021-10-12 ENCOUNTER — Other Ambulatory Visit: Payer: Self-pay

## 2021-10-12 DIAGNOSIS — R42 Dizziness and giddiness: Secondary | ICD-10-CM | POA: Diagnosis not present

## 2021-10-12 DIAGNOSIS — R2689 Other abnormalities of gait and mobility: Secondary | ICD-10-CM

## 2021-10-12 DIAGNOSIS — R2681 Unsteadiness on feet: Secondary | ICD-10-CM

## 2021-10-12 NOTE — Therapy (Signed)
[facility] [address] [place], [place], [address] Phone: [phone]   Fax:  [phone]  Physical Therapy Treatment  Patient Details  Name: Don Ruiz MRN: [id number] Date of Birth: [birth year] Referring Provider (PT): Gerlean Ren MD is hospitalist/ Dr. Marlou Porch MD is primary care   Encounter Date: 10/12/2021   PT End of Session - 10/12/21 1659     Visit Number 11    Number of Visits 17    Date for PT Re-Evaluation 11/02/21    Authorization Type UHC Medicare    Progress Note Due on Visit 10    PT Start Time 1618    PT Stop Time 1656    PT Time Calculation (min) 38 min    Equipment Utilized During Treatment Gait belt    Activity Tolerance Patient tolerated treatment well    Behavior During Therapy [facility] for tasks assessed/performed             Past Medical History:  Diagnosis Date   Ankle impingement syndrome    Right ankle from bone spur   Antiphospholipid antibody syndrome ([place]) 06/12/2012   Coagulopathy ([place]) 06/12/2012   DVT (deep venous thrombosis) ([place]) 1990, 2002   chronic hypercoagulobility.    DVT, recurrent, lower extremity, chronic 06/12/2012   1992   Dysrhythmia    AF   GERD (gastroesophageal reflux disease)    H/O tinnitus    Heterozygous factor V Leiden mutation ([place]) 06/12/2012   Hyperlipidemia    Migraine headache    Protein S deficiency ([place]) 06/12/2012   Prothrombin gene mutation ([place]) 06/12/2012   Pulmonary embolism ([place])    Pulmonary embolism, bilateral ([place]) 06/12/2012   July, 2002   PVC (premature ventricular contraction)    Varicose veins 06/12/2012   RLE   Wears hearing aid     Past Surgical History:  Procedure Laterality Date   ABDOMINAL AORTIC ANEURYSM REPAIR  12/14   [facility]   ANKLE SURGERY  2005   right   APPENDECTOMY     ARTERY BIOPSY Left 08/12/2014   Procedure: LEFT TEMPORAL ARTERY BIOPSY ;  Surgeon: Coralie Keens, MD;  Location: [facility];  Service:  General;  Laterality: Left;   CHOLECYSTECTOMY  2008   lap choli   COLONOSCOPY N/A 09/03/2013   Procedure: COLONOSCOPY;  Surgeon: Garlan Fair, MD;  Location: [facility];  Service: Endoscopy;  Laterality: N/A;   FINGER SURGERY     Right 1st finger,  by Dr. Kalman Shan TENDON REPAIR  2013   left 5th finger by Dr. Caralyn Guile   INCISION AND DRAINAGE HIP Left 02/17/2021   Procedure: IRRIGATION AND DEBRIDEMENT LEFT THIGH;  Surgeon: Dorna Leitz, MD;  Location: [facility];  Service: Orthopedics;  Laterality: Left;   PATELLA RELEASE AND MANIPULATION  1992   Right Patella    There were no vitals filed for this visit.   Subjective Assessment - 10/12/21 1619     Subjective Completed 1.5 mile and 2.5 mile walk since last session. Has a crick in his neck. Tried driving and still having some dizziness with head turns.    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    Currently in Pain? No/denies  [facility] Adult PT Treatment/Exercise - 10/12/21 0001       Neuro Re-ed    Neuro Re-ed Details  single leg RDL to elevated mat 10x each; R/L fwd/back head nods/turns to targets 10x, R/L 1/2 turn to target + 4 toe tap on cone; 3 multidirectional toe tap on cones on firm/foam surface with CGA-min A   c/o 4/10 dizziness with head nods                Balance Exercises - 10/12/21 0001       Balance Exercises: Standing   Wall Bumps Hip;20 reps;Eyes closed   on firm/foam   Gait with Head Turns Limitations    Gait with Head Turns Limitations gait + tossing ball R/L and up 2x59f each                  PT Short Term Goals - 09/27/21 1539       PT SHORT TERM GOAL #1   Title The patient will be indep with HEP for gaze adaptation, habituation, balance,  and general mobility.    Time 4    Period Weeks    Status Achieved    Target Date 10/03/21      PT SHORT TERM GOAL #2   Title The patient will be able to move sit<>stand 5 reps without UE use, and without use of RW mod indep.    Time 4    Period Weeks    Status Achieved    Target Date 10/03/21      PT SHORT TERM GOAL #3   Title The patient will ambulate mod indep with RW in home and community x 300 ft without loss of balance.    Baseline *The patient reports he was safe with the walker and has progressed to no device -- intermittent UE support on walls/furniture 09/13/21.    Time 4    Period Weeks    Status Achieved    Target Date 10/03/21      PT SHORT TERM GOAL #4   Title The patient will tolerate gaze adaptation x 1 viewing x 30 seconds in seated or standing position.    Baseline tolerated 20 seconds today 09/27/21    Time 4    Period Weeks    Status Partially Met    Target Date 10/03/21      PT SHORT TERM GOAL #5   Title The patient will demonstrate bed mobility with dizziness < or equal to 2/10.    Baseline reports falling posteriorly with sit>supine.    Time 4    Period Weeks    Status Achieved    Target Date 10/03/21      PT SHORT TERM GOAL #6   Title PT to further assess Berg and/or FGA when appropriate.    Time 4    Period Weeks    Status Achieved    Target Date 10/03/21               PT Long Term Goals - 09/10/21 1158       PT LONG TERM GOAL #1   Title The patient will return to modified gym routine.    Time 8    Period Weeks    Target Date 11/02/21      PT LONG TERM GOAL #2   Title The patient will ambulate without an assistive device for household and community distances independently >500 ft.    Time 8    Period Weeks  Target Date 11/02/21      PT LONG TERM GOAL #3   Title The patient will tolerate gaze x 1 adaptation x 60 seconds without c/o dizziness.    Time 8    Period Weeks    Target Date 11/02/21      PT LONG TERM GOAL #4    Title The patient will improve Berg balance testing by 8 points.    Baseline 44/56    Time 8    Period Weeks    Target Date 11/02/21      PT LONG TERM GOAL #5   Title The patient will improve gait speed to > or equal to 2.7 ft/sec demonstrating dec'd fall risk and return to full community ambulator classification of gait.    Time 8    Period Weeks    Target Date 11/02/21                   Plan - 10/12/21 1659     Clinical Impression Statement Patient arrived to session with report of continuing to increase his walking tolerance. Notes remaining dizziness with quick head turns when driving. Patient performed dynamic balance activities with bending and head turns/nods with most c/o dizziness occurring with head nods at 4/10. Wall bumps were progressed to foam with EC; patient with tendency for retropulsion, requiring a reset with EO to improve spatial awareness. Overall patient tolerated high level balance activities well today; intermittent sitting rest breaks taken to allow symptoms to settle. Also required frequent cueing to decrease speed to prevent symptoms from elevating too high.    Comorbidities prior h/o R sensorineural hearing loss (unsure if vestibular nerve impairment), h/o clotting disorder, L hearing absent    PT Frequency 2x / week    PT Duration 8 weeks    PT Treatment/Interventions ADLs/Self Care Home Management;Patient/family education;Neuromuscular re-education;Balance training;Therapeutic exercise;Therapeutic activities;Functional mobility training;Gait training;Stair training;Manual techniques;Vestibular    PT Next Visit Plan Progess HEP to tolerance, VOR in left rotation for horiz/vertical, habituation long sit>L head rotation to supine, progress functional standing balance and work on return to gym routine.    Consulted and Agree with Plan of Care Patient             Patient will benefit from skilled therapeutic intervention in order to improve the  following deficits and impairments:  Dizziness, Abnormal gait, Difficulty walking, Decreased activity tolerance, Decreased balance, Decreased mobility, Postural dysfunction, Impaired vision/preception  Visit Diagnosis: Dizziness and giddiness  Unsteadiness on feet  Other abnormalities of gait and mobility     Problem List Patient Active Problem List   Diagnosis Date Noted   Vertigo 08/28/2021   PAF (paroxysmal atrial fibrillation) ([place]) 08/28/2021   Hematoma of left thigh 02/17/2021   Elevated LFTs    Acute epigastric pain 02/04/2017   HTN (hypertension) 09/27/2013   SNHL (sensorineural hearing loss) 05/24/2013   CAD (coronary artery disease) 04/18/2013   Chronic anticoagulation 04/18/2013   GERD (gastroesophageal reflux disease) 04/18/2013   History of pulmonary embolism 04/18/2013   Blood clotting disorder ([place]) 06/12/2012   Heterozygous factor V Leiden mutation ([place]) 06/12/2012   Prothrombin gene mutation ([place]) 06/12/2012   Antiphospholipid antibody syndrome ([place]) 06/12/2012   Protein S deficiency ([place]) 06/12/2012   DVT, recurrent, lower extremity, chronic 06/12/2012   Pulmonary embolism, bilateral ([place]) 06/12/2012   Varicose veins 06/12/2012   Chronic venous embolism and thrombosis of deep vessels of lower extremity ([place]) 06/12/2012   Primary hypercoagulable state ([place]) 06/12/2012  Aneurysm of abdominal vessel 03/12/2012   Dissection of aorta, abdominal ([place]) 03/12/2012   ABNORMALITY OF GAIT 07/02/2009   SPRAIN&STRAIN OTHER SPECIFIED SITES KNEE&LEG 07/02/2009    Janene Harvey, PT, DPT 10/12/21 5:02 PM   [facility] [address], [place], [place], [address] Phone: [phone]   Fax:  [phone]  Name: Markell Sciascia MRN: [id number] Date of Birth: June 01, 1951

## 2021-10-14 ENCOUNTER — Other Ambulatory Visit: Payer: Self-pay

## 2021-10-14 ENCOUNTER — Encounter: Payer: Self-pay | Admitting: Physical Therapy

## 2021-10-14 ENCOUNTER — Ambulatory Visit: Payer: Medicare Other | Admitting: Physical Therapy

## 2021-10-14 DIAGNOSIS — R2689 Other abnormalities of gait and mobility: Secondary | ICD-10-CM

## 2021-10-14 DIAGNOSIS — R2681 Unsteadiness on feet: Secondary | ICD-10-CM

## 2021-10-14 DIAGNOSIS — R42 Dizziness and giddiness: Secondary | ICD-10-CM

## 2021-10-14 NOTE — Therapy (Signed)
[facility] [address] [place], [place], [address] Phone: [phone]   Fax:  [phone]  Physical Therapy Treatment  Patient Details  Name: Don Ruiz MRN: [id number] Date of Birth: [birth year] Referring Provider (PT): Gerlean Ren MD is hospitalist/ Dr. Marlou Porch MD is primary care   Encounter Date: 10/14/2021   PT End of Session - 10/14/21 1020     Visit Number 12    Number of Visits 17    Date for PT Re-Evaluation 11/02/21    Authorization Type UHC Medicare    Progress Note Due on Visit 10    PT Start Time 0932    PT Stop Time 1013    PT Time Calculation (min) 41 min    Equipment Utilized During Treatment Gait belt    Activity Tolerance Patient tolerated treatment well    Behavior During Therapy [facility] for tasks assessed/performed             Past Medical History:  Diagnosis Date   Ankle impingement syndrome    Right ankle from bone spur   Antiphospholipid antibody syndrome ([place]) 06/12/2012   Coagulopathy ([place]) 06/12/2012   DVT (deep venous thrombosis) ([place]) 1990, 2002   chronic hypercoagulobility.    DVT, recurrent, lower extremity, chronic 06/12/2012   1992   Dysrhythmia    AF   GERD (gastroesophageal reflux disease)    H/O tinnitus    Heterozygous factor V Leiden mutation ([place]) 06/12/2012   Hyperlipidemia    Migraine headache    Protein S deficiency ([place]) 06/12/2012   Prothrombin gene mutation ([place]) 06/12/2012   Pulmonary embolism ([place])    Pulmonary embolism, bilateral ([place]) 06/12/2012   July, 2002   PVC (premature ventricular contraction)    Varicose veins 06/12/2012   RLE   Wears hearing aid     Past Surgical History:  Procedure Laterality Date   ABDOMINAL AORTIC ANEURYSM REPAIR  12/14   [facility]   ANKLE SURGERY  2005   right   APPENDECTOMY     ARTERY BIOPSY Left 08/12/2014   Procedure: LEFT TEMPORAL ARTERY BIOPSY ;  Surgeon: Coralie Keens, MD;  Location: [facility];  Service:  General;  Laterality: Left;   CHOLECYSTECTOMY  2008   lap choli   COLONOSCOPY N/A 09/03/2013   Procedure: COLONOSCOPY;  Surgeon: Garlan Fair, MD;  [facility];  Service: Endoscopy;  Laterality: N/A;   FINGER SURGERY     Right 1st finger,  by Dr. Kalman Shan TENDON REPAIR  2013   left 5th finger by Dr. Caralyn Guile   INCISION AND DRAINAGE HIP Left 02/17/2021   Procedure: IRRIGATION AND DEBRIDEMENT LEFT THIGH;  Surgeon: Dorna Leitz, MD;  [facility];  Service: Orthopedics;  Laterality: Left;   PATELLA RELEASE AND MANIPULATION  1992   Right Patella    There were no vitals filed for this visit.   Subjective Assessment - 10/14/21 0930     Subjective Feeling a little foggy today. On a new medication this AM- Protonix but took it after already feeling foggy. Had a pop in his back while doing one of his exercises yesterday- still feeling sore. Tried the elliptical for 1 min and 3 min since last session.    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    Currently in Pain? Yes    Pain Score 2     Pain Location Back    Pain Orientation Lower    Pain Descriptors / Indicators Aching    Pain Type Acute pain                               [facility] Adult PT Treatment/Exercise - 10/14/21 0001       Neuro Re-ed    Neuro Re-ed Details  D2 flexion with green medball to cone 10x, R/L frwd step over cone, turn to pick back up 7x each with CGA      Exercises   Other Exercises  prayer stretch with blue pball 10x3", sitting on pball alt UE/LE lift, alt LAQ with CGA-min A, sitting rusian twist with green medball 3x10; quadruped thread the needle 10x, bird dog 10x             Vestibular Treatment/Exercise - 10/14/21 0001       Vestibular Treatment/Exercise   Gaze  Exercises X2 Viewing Vertical;X2 Viewing Horizontal      X1 Viewing Horizontal   Foot Position march    Reps --   30" x 2   Comments to 2 targets on wall      X2 Viewing Horizontal   Foot Position standing    Reps --   30"    Comments good tolerance      X2 Viewing Vertical   Foot Position standing    Reps --   30"   Comments good tolerance                    PT Education - 10/14/21 1020     Education Details update to HEP-  Access Code: [email or web address]    Person(s) Educated Patient    Methods Explanation;Demonstration;Tactile cues;Verbal cues;Handout    Comprehension Verbalized understanding;Returned demonstration              PT Short Term Goals - 09/27/21 1539       PT SHORT TERM GOAL #1   Title The patient will be indep with HEP for gaze adaptation, habituation, balance, and general mobility.    Time 4    Period Weeks    Status Achieved    Target Date 10/03/21      PT SHORT TERM GOAL #2   Title The patient will be able to move sit<>stand 5 reps without UE use, and without use of RW mod indep.    Time 4    Period Weeks    Status Achieved    Target Date 10/03/21      PT SHORT TERM GOAL #3   Title The patient will ambulate mod indep with RW in home and community x 300 ft without loss of balance.    Baseline *The patient reports he was safe with the walker and has progressed to no device -- intermittent UE support on walls/furniture 09/13/21.    Time 4    Period Weeks    Status Achieved    Target Date 10/03/21      PT SHORT TERM GOAL #4   Title The patient will tolerate gaze adaptation x 1 viewing x 30 seconds in seated or standing position.    Baseline tolerated 20 seconds today 09/27/21    Time 4    Period Weeks    Status Partially Met    Target  Date 10/03/21      PT SHORT TERM GOAL #5   Title The patient will demonstrate bed mobility with dizziness < or equal to 2/10.    Baseline reports falling posteriorly with sit>supine.    Time 4    Period  Weeks    Status Achieved    Target Date 10/03/21      PT SHORT TERM GOAL #6   Title PT to further assess Berg and/or FGA when appropriate.    Time 4    Period Weeks    Status Achieved    Target Date 10/03/21               PT Long Term Goals - 09/10/21 1158       PT LONG TERM GOAL #1   Title The patient will return to modified gym routine.    Time 8    Period Weeks    Target Date 11/02/21      PT LONG TERM GOAL #2   Title The patient will ambulate without an assistive device for household and community distances independently >500 ft.    Time 8    Period Weeks    Target Date 11/02/21      PT LONG TERM GOAL #3   Title The patient will tolerate gaze x 1 adaptation x 60 seconds without c/o dizziness.    Time 8    Period Weeks    Target Date 11/02/21      PT LONG TERM GOAL #4   Title The patient will improve Berg balance testing by 8 points.    Baseline 44/56    Time 8    Period Weeks    Target Date 11/02/21      PT LONG TERM GOAL #5   Title The patient will improve gait speed to > or equal to 2.7 ft/sec demonstrating dec'd fall risk and return to full community ambulator classification of gait.    Time 8    Period Weeks    Target Date 11/02/21                   Plan - 10/14/21 1021     Clinical Impression Statement Patient arrived to session with report of some fogginess this AM as well as some mild LBP after feeling a pop in his back while performing HEP yesterday. Reports coming back up from bending still elicits dizziness, as do quick head turns within wide ranges of motion. Sitting balance activities on compliant surface required CGA-min A d/t instability. Simulated bending acuities and quick head turns to targets with c/o 2-3/10 dizziness today. Patient still requires cues to slow down and take breaks in between exercises to prevent excessive symptoms. Updated HEP with new forward bending exercise which was well tolerated today and removed RDL to  avoid aggravation of LBP. Patient reported understanding and without complaints at end of session.    Comorbidities prior h/o R sensorineural hearing loss (unsure if vestibular nerve impairment), h/o clotting disorder, L hearing absent    PT Frequency 2x / week    PT Duration 8 weeks    PT Treatment/Interventions ADLs/Self Care Home Management;Patient/family education;Neuromuscular re-education;Balance training;Therapeutic exercise;Therapeutic activities;Functional mobility training;Gait training;Stair training;Manual techniques;Vestibular    PT Next Visit Plan Progess HEP to tolerance, VOR in left rotation for horiz/vertical, habituation long sit>L head rotation to supine, progress functional standing balance and work on return to gym routine.    Consulted and Agree with Plan of Care Patient  Patient will benefit from skilled therapeutic intervention in order to improve the following deficits and impairments:  Dizziness, Abnormal gait, Difficulty walking, Decreased activity tolerance, Decreased balance, Decreased mobility, Postural dysfunction, Impaired vision/preception  Visit Diagnosis: Dizziness and giddiness  Unsteadiness on feet  Other abnormalities of gait and mobility     Problem List Patient Active Problem List   Diagnosis Date Noted   Vertigo 08/28/2021   PAF (paroxysmal atrial fibrillation) ([place]) 08/28/2021   Hematoma of left thigh 02/17/2021   Elevated LFTs    Acute epigastric pain 02/04/2017   HTN (hypertension) 09/27/2013   SNHL (sensorineural hearing loss) 05/24/2013   CAD (coronary artery disease) 04/18/2013   Chronic anticoagulation 04/18/2013   GERD (gastroesophageal reflux disease) 04/18/2013   History of pulmonary embolism 04/18/2013   Blood clotting disorder ([place]) 06/12/2012   Heterozygous factor V Leiden mutation ([place]) 06/12/2012   Prothrombin gene mutation ([place]) 06/12/2012   Antiphospholipid antibody syndrome ([place]) 06/12/2012   Protein S  deficiency ([place]) 06/12/2012   DVT, recurrent, lower extremity, chronic 06/12/2012   Pulmonary embolism, bilateral ([place]) 06/12/2012   Varicose veins 06/12/2012   Chronic venous embolism and thrombosis of deep vessels of lower extremity ([place]) 06/12/2012   Primary hypercoagulable state ([place]) 06/12/2012   Aneurysm of abdominal vessel 03/12/2012   Dissection of aorta, abdominal ([place]) 03/12/2012   ABNORMALITY OF GAIT 07/02/2009   SPRAIN&STRAIN OTHER SPECIFIED SITES KNEE&LEG 07/02/2009    Janene Harvey, PT, DPT 10/14/21 10:26 AM   [facility] [address], [place], [place], [address] Phone: [phone]   Fax:  [phone]  Name: Don Ruiz MRN: [id number] Date of Birth: 28-Oct-1950

## 2021-10-19 ENCOUNTER — Ambulatory Visit: Payer: Medicare Other | Admitting: Physical Therapy

## 2021-10-19 ENCOUNTER — Encounter: Payer: Self-pay | Admitting: Physical Therapy

## 2021-10-19 ENCOUNTER — Other Ambulatory Visit: Payer: Self-pay

## 2021-10-19 DIAGNOSIS — R42 Dizziness and giddiness: Secondary | ICD-10-CM

## 2021-10-19 DIAGNOSIS — R2689 Other abnormalities of gait and mobility: Secondary | ICD-10-CM

## 2021-10-19 DIAGNOSIS — R2681 Unsteadiness on feet: Secondary | ICD-10-CM

## 2021-10-19 NOTE — Therapy (Signed)
[facility] [address] [place], [place], [address] Phone: [phone]   Fax:  [phone]  Physical Therapy Treatment  Patient Details  Name: Don Ruiz MRN: [id number] Date of Birth: April 10, 1951 Referring Provider (PT): Gerlean Ren MD is hospitalist/ Dr. Marlou Porch MD is primary care   Encounter Date: 10/19/2021   PT End of Session - 10/19/21 1613     Visit Number 13    Number of Visits 17    Date for PT Re-Evaluation 11/02/21    Authorization Type UHC Medicare    Progress Note Due on Visit 10    PT Start Time 1531    PT Stop Time 1609    PT Time Calculation (min) 38 min    Equipment Utilized During Treatment Gait belt    Activity Tolerance Patient tolerated treatment well    Behavior During Therapy [facility] for tasks assessed/performed             Past Medical History:  Diagnosis Date   Ankle impingement syndrome    Right ankle from bone spur   Antiphospholipid antibody syndrome ([place]) 06/12/2012   Coagulopathy ([place]) 06/12/2012   DVT (deep venous thrombosis) ([place]) 1990, 2002   chronic hypercoagulobility.    DVT, recurrent, lower extremity, chronic 06/12/2012   1992   Dysrhythmia    AF   GERD (gastroesophageal reflux disease)    H/O tinnitus    Heterozygous factor V Leiden mutation ([place]) 06/12/2012   Hyperlipidemia    Migraine headache    Protein S deficiency ([place]) 06/12/2012   Prothrombin gene mutation ([place]) 06/12/2012   Pulmonary embolism ([place])    Pulmonary embolism, bilateral ([place]) 06/12/2012   July, 2002   PVC (premature ventricular contraction)    Varicose veins 06/12/2012   RLE   Wears hearing aid     Past Surgical History:  Procedure Laterality Date   ABDOMINAL AORTIC ANEURYSM REPAIR  12/14   [facility]   ANKLE SURGERY  2005   right   APPENDECTOMY     ARTERY BIOPSY Left 08/12/2014   Procedure: LEFT TEMPORAL ARTERY BIOPSY ;  Surgeon: Coralie Keens, MD;  Location: [place] [facility];  Service:  General;  Laterality: Left;   CHOLECYSTECTOMY  2008   lap choli   COLONOSCOPY N/A 09/03/2013   Procedure: COLONOSCOPY;  Surgeon: Garlan Fair, MD;  Location: [facility];  Service: Endoscopy;  Laterality: N/A;   FINGER SURGERY     Right 1st finger,  by Dr. Kalman Shan TENDON REPAIR  2013   left 5th finger by Dr. Caralyn Guile   INCISION AND DRAINAGE HIP Left 02/17/2021   Procedure: IRRIGATION AND DEBRIDEMENT LEFT THIGH;  Surgeon: Dorna Leitz, MD;  Location: [facility];  Service: Orthopedics;  Laterality: Left;   PATELLA RELEASE AND MANIPULATION  1992   Right Patella    There were no vitals filed for this visit.   Subjective Assessment - 10/19/21 1531     Subjective Tolerated 12 minutes on the elliptical. Will be playing golf next Tuesday. Back is feeling better.    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    Currently in Pain? No/denies  [place] Adult PT Treatment/Exercise - 10/19/21 0001       Neuro Re-ed    Neuro Re-ed Details  L head turn + nodding, 30" romberg, 2x30" with 1 foot on 4" step; 1 foot on step + head turns to multiple targets with CGA; red medball toss/catch + turning to touch colored cones      Exercises   Other Exercises  walking lunges, side lunges and sidestepping with eyes on target with CGA d/t slight imbalance; modified downward dog and thread the needle on elevated mat 10x   cues to increase step length and correct form                      PT Short Term Goals - 09/27/21 1539       PT SHORT TERM GOAL #1   Title The patient will be indep with HEP for gaze adaptation, habituation, balance, and general mobility.    Time 4    Period Weeks    Status Achieved    Target Date 10/03/21      PT SHORT TERM  GOAL #2   Title The patient will be able to move sit<>stand 5 reps without UE use, and without use of RW mod indep.    Time 4    Period Weeks    Status Achieved    Target Date 10/03/21      PT SHORT TERM GOAL #3   Title The patient will ambulate mod indep with RW in home and community x 300 ft without loss of balance.    Baseline *The patient reports he was safe with the walker and has progressed to no device -- intermittent UE support on walls/furniture 09/13/21.    Time 4    Period Weeks    Status Achieved    Target Date 10/03/21      PT SHORT TERM GOAL #4   Title The patient will tolerate gaze adaptation x 1 viewing x 30 seconds in seated or standing position.    Baseline tolerated 20 seconds today 09/27/21    Time 4    Period Weeks    Status Partially Met    Target Date 10/03/21      PT SHORT TERM GOAL #5   Title The patient will demonstrate bed mobility with dizziness < or equal to 2/10.    Baseline reports falling posteriorly with sit>supine.    Time 4    Period Weeks    Status Achieved    Target Date 10/03/21      PT SHORT TERM GOAL #6   Title PT to further assess Berg and/or FGA when appropriate.    Time 4    Period Weeks    Status Achieved    Target Date 10/03/21               PT Long Term Goals - 09/10/21 1158       PT LONG TERM GOAL #1   Title The patient will return to modified gym routine.    Time 8    Period Weeks    Target Date 11/02/21      PT LONG TERM GOAL #2   Title The patient will ambulate without an assistive device for household and community distances independently >500 ft.    Time 8    Period Weeks    Target Date 11/02/21      PT LONG TERM GOAL #3   Title The patient will tolerate gaze x 1  adaptation x 60 seconds without c/o dizziness.    Time 8    Period Weeks    Target Date 11/02/21      PT LONG TERM GOAL #4   Title The patient will improve Berg balance testing by 8 points.    Baseline 44/56    Time 8    Period Weeks     Target Date 11/02/21      PT LONG TERM GOAL #5   Title The patient will improve gait speed to > or equal to 2.7 ft/sec demonstrating dec'd fall risk and return to full community ambulator classification of gait.    Time 8    Period Weeks    Target Date 11/02/21                   Plan - 10/19/21 1613     Clinical Impression Statement Patient arrived to session with report of tolerating elliptical for longer periods and noting that he will be trying golf next week. Worked on head nods with head turned to the L side as this is an aggravating position for him. Patient initially with mild imbalance which improved with practice. Worked on incorporating general fitness activities with head turned to L with some imbalance with quick movements. During balance activities, patient stabilized with L hand on the edge of a cabinet and sustained 2 small cuts in the center of the L palm. No pain, redness, edema, or warmth evident. Patient is progressing well with more challenging sessions. No complaints at end of session.    Comorbidities prior h/o R sensorineural hearing loss (unsure if vestibular nerve impairment), h/o clotting disorder, L hearing absent    PT Frequency 2x / week    PT Duration 8 weeks    PT Treatment/Interventions ADLs/Self Care Home Management;Patient/family education;Neuromuscular re-education;Balance training;Therapeutic exercise;Therapeutic activities;Functional mobility training;Gait training;Stair training;Manual techniques;Vestibular    PT Next Visit Plan Progess HEP to tolerance, VOR in left rotation for horiz/vertical, habituation long sit>L head rotation to supine, progress functional standing balance and work on return to gym routine.    Consulted and Agree with Plan of Care Patient             Patient will benefit from skilled therapeutic intervention in order to improve the following deficits and impairments:  Dizziness, Abnormal gait, Difficulty walking, Decreased  activity tolerance, Decreased balance, Decreased mobility, Postural dysfunction, Impaired vision/preception  Visit Diagnosis: Dizziness and giddiness  Unsteadiness on feet  Other abnormalities of gait and mobility     Problem List Patient Active Problem List   Diagnosis Date Noted   Vertigo 08/28/2021   PAF (paroxysmal atrial fibrillation) ([place]) 08/28/2021   Hematoma of left thigh 02/17/2021   Elevated LFTs    Acute epigastric pain 02/04/2017   HTN (hypertension) 09/27/2013   SNHL (sensorineural hearing loss) 05/24/2013   CAD (coronary artery disease) 04/18/2013   Chronic anticoagulation 04/18/2013   GERD (gastroesophageal reflux disease) 04/18/2013   History of pulmonary embolism 04/18/2013   Blood clotting disorder ([place]) 06/12/2012   Heterozygous factor V Leiden mutation ([place]) 06/12/2012   Prothrombin gene mutation ([place]) 06/12/2012   Antiphospholipid antibody syndrome ([place]) 06/12/2012   Protein S deficiency ([place]) 06/12/2012   DVT, recurrent, lower extremity, chronic 06/12/2012   Pulmonary embolism, bilateral ([place]) 06/12/2012   Varicose veins 06/12/2012   Chronic venous embolism and thrombosis of deep vessels of lower extremity ([place]) 06/12/2012   Primary hypercoagulable state ([place]) 06/12/2012   Aneurysm of abdominal vessel 03/12/2012  Dissection of aorta, abdominal ([place]) 03/12/2012   ABNORMALITY OF GAIT 07/02/2009   SPRAIN&STRAIN OTHER SPECIFIED SITES KNEE&LEG 07/02/2009    Janene Harvey, PT, DPT 10/19/21 5:06 PM   [facility] [address], [place], [place], [address] Phone: [phone]   Fax:  [phone]  Name: Don Ruiz MRN: [id number] Date of Birth: 06-28-1951

## 2021-10-21 ENCOUNTER — Ambulatory Visit: Payer: Medicare Other | Admitting: Physical Therapy

## 2021-10-26 ENCOUNTER — Ambulatory Visit: Payer: 59 | Admitting: Physical Therapy

## 2021-10-28 ENCOUNTER — Ambulatory Visit: Payer: Medicare Other | Admitting: Physical Therapy

## 2021-10-28 ENCOUNTER — Encounter: Payer: Self-pay | Admitting: Physical Therapy

## 2021-10-28 ENCOUNTER — Other Ambulatory Visit: Payer: Self-pay

## 2021-10-28 DIAGNOSIS — R2681 Unsteadiness on feet: Secondary | ICD-10-CM

## 2021-10-28 DIAGNOSIS — R2689 Other abnormalities of gait and mobility: Secondary | ICD-10-CM

## 2021-10-28 DIAGNOSIS — R42 Dizziness and giddiness: Secondary | ICD-10-CM | POA: Diagnosis not present

## 2021-10-28 NOTE — Therapy (Signed)
[facility] [address], [place], [place], [address] Phone: [phone]   Fax:  [phone]  Physical Therapy Treatment  Patient Details  Name: Don Ruiz MRN: [id number] Date of Birth: 01-Jan-1951 Referring Provider (PT): Gerlean Ren MD is hospitalist/ Dr. Marlou Porch MD is primary care   Encounter Date: 10/28/2021   PT End of Session - 10/28/21 1543     Visit Number 14    Number of Visits 17    Date for PT Re-Evaluation 11/02/21    Authorization Type UHC Medicare    Progress Note Due on Visit 10    PT Start Time 1405    PT Stop Time 1444    PT Time Calculation (min) 39 min    Equipment Utilized During Treatment Gait belt    Activity Tolerance Patient tolerated treatment well    Behavior During Therapy [facility] for tasks assessed/performed             Past Medical History:  Diagnosis Date   Ankle impingement syndrome    Right ankle from bone spur   Antiphospholipid antibody syndrome ([place]) 06/12/2012   Coagulopathy ([place]) 06/12/2012   DVT (deep venous thrombosis) ([place]) 1990, 2002   chronic hypercoagulobility.    DVT, recurrent, lower extremity, chronic 06/12/2012   1992   Dysrhythmia    AF   GERD (gastroesophageal reflux disease)    H/O tinnitus    Heterozygous factor V Leiden mutation ([place]) 06/12/2012   Hyperlipidemia    Migraine headache    Protein S deficiency ([place]) 06/12/2012   Prothrombin gene mutation ([place]) 06/12/2012   Pulmonary embolism ([place])    Pulmonary embolism, bilateral ([place]) 06/12/2012   July, 2002   PVC (premature ventricular contraction)    Varicose veins 06/12/2012   RLE   Wears hearing aid     Past Surgical History:  Procedure Laterality Date   ABDOMINAL AORTIC ANEURYSM REPAIR  12/14   [facility]   ANKLE SURGERY  2005   right   APPENDECTOMY     ARTERY BIOPSY Left 08/12/2014   Procedure: LEFT TEMPORAL ARTERY BIOPSY ;  Surgeon: Coralie Keens, MD;  Location: [facility];  Service:  General;  Laterality: Left;   CHOLECYSTECTOMY  2008   lap choli   COLONOSCOPY N/A 09/03/2013   Procedure: COLONOSCOPY;  Surgeon: Garlan Fair, MD;  [facility];  Service: Endoscopy;  Laterality: N/A;   FINGER SURGERY     Right 1st finger,  by Dr. Kalman Shan TENDON REPAIR  2013   left 5th finger by Dr. Caralyn Guile   INCISION AND DRAINAGE HIP Left 02/17/2021   Procedure: IRRIGATION AND DEBRIDEMENT LEFT THIGH;  Surgeon: Dorna Leitz, MD;  [facility];  Service: Orthopedics;  Laterality: Left;   PATELLA RELEASE AND MANIPULATION  1992   Right Patella    There were no vitals filed for this visit.   Subjective Assessment - 10/28/21 1406     Subjective Went golfing last week- no trouble swinging the club but did have LOB x2 afterward. Has down a 4 mile walk and up to 22 minutes on elliptical. Sometimes when he wakes up the room is spinning, this is not new.    Pertinent History HARD OF HEARING-- turn radio off and have dry erase board (speak on R side); clotting disorder, R hearing loss, L hearing absent, HTN    Currently in Pain? No/denies  [place] Adult PT Treatment/Exercise - 10/28/21 0001       Neuro Re-ed    Neuro Re-ed Details  R/L modified side plank + side reach 10x each, russian twist with heels on mat + ball VOR cancellation 2x10, sidestepping with green loop around ankles with head turned to L on target 4x 47f;catch ball, turn to touch ball to letters on wall - c/o mild spinning with L head turns; tandem walk forward/backward 4x358f walking lunges                     PT Education - 10/28/21 1543     Education Details update to HEP-Access Code: [email or web address]; discussion on progress with therapy and plan for transition to HEP next session    Person(s) Educated Patient    Methods Explanation;Demonstration;Tactile cues;Verbal cues;Handout    Comprehension Returned demonstration;Verbalized understanding               PT Short Term Goals - 09/27/21 1539       PT SHORT TERM GOAL #1   Title The patient will be indep with HEP for gaze adaptation, habituation, balance, and general mobility.    Time 4    Period Weeks    Status Achieved    Target Date 10/03/21      PT SHORT TERM GOAL #2   Title The patient will be able to move sit<>stand 5 reps without UE use, and without use of RW mod indep.    Time 4    Period Weeks    Status Achieved    Target Date 10/03/21      PT SHORT TERM GOAL #3   Title The patient will ambulate mod indep with RW in home and community x 300 ft without loss of balance.    Baseline *The patient reports he was safe with the walker and has progressed to no device -- intermittent UE support on walls/furniture 09/13/21.    Time 4    Period Weeks    Status Achieved    Target Date 10/03/21      PT SHORT TERM GOAL #4   Title The patient will tolerate gaze adaptation x 1 viewing x 30 seconds in seated or standing position.    Baseline tolerated 20 seconds today 09/27/21    Time 4    Period Weeks    Status Partially Met    Target Date 10/03/21      PT SHORT TERM GOAL #5   Title The patient will demonstrate bed mobility with dizziness < or equal to 2/10.    Baseline reports falling posteriorly with sit>supine.    Time 4    Period Weeks    Status Achieved    Target Date 10/03/21      PT SHORT TERM GOAL #6   Title PT to further assess Berg and/or FGA when appropriate.    Time 4    Period Weeks    Status Achieved    Target Date 10/03/21               PT Long Term Goals - 09/10/21 1158       PT LONG TERM GOAL #1   Title The patient will return to modified gym routine.    Time 8    Period Weeks    Target Date 11/02/21      PT LONG TERM GOAL #2   Title The patient will ambulate without an assistive device for household and community distances independently >500  ft.    Time 8    Period Weeks    Target Date 11/02/21      PT LONG TERM GOAL #3   Title  The patient will tolerate gaze x 1 adaptation x 60 seconds without c/o dizziness.    Time 8    Period Weeks    Target Date 11/02/21      PT LONG TERM GOAL #4   Title The patient will improve Berg balance testing by 8 points.    Baseline 44/56    Time 8    Period Weeks    Target Date 11/02/21      PT LONG TERM GOAL #5   Title The patient will improve gait speed to > or equal to 2.7 ft/sec demonstrating dec'd fall risk and return to full community ambulator classification of gait.    Time 8    Period Weeks    Target Date 11/02/21                   Plan - 10/28/21 1544     Clinical Impression Statement Patient arrived to session with report of continuing to return to active hobbies. Reports that he went golfing without issues swinging the club, but did notice a LOB x2 without falls. Also reports tolerating 22 minutes on the elliptical. Continued habituation and gaze stabilization activities today with integration to fitness activities to assist patient in transitioning to active hobbies. Patient still notes dizziness with activities with head turned to the L and with walking lunges today. Reviewed, consolidated, and updated HEP to increase exposure to more challenging fitness activities that still bring on symptoms. Patient reported understanding and without complaints at end of session. Plan for D/C next session d/t progress towards goals.    Comorbidities prior h/o R sensorineural hearing loss (unsure if vestibular nerve impairment), h/o clotting disorder, L hearing absent    PT Frequency 2x / week    PT Duration 8 weeks    PT Treatment/Interventions ADLs/Self Care Home Management;Patient/family education;Neuromuscular re-education;Balance training;Therapeutic exercise;Therapeutic activities;Functional mobility training;Gait training;Stair training;Manual techniques;Vestibular    PT Next Visit Plan Progess HEP to tolerance, VOR in left rotation for horiz/vertical, habituation long  sit>L head rotation to supine, progress functional standing balance and work on return to gym routine.    Consulted and Agree with Plan of Care Patient             Patient will benefit from skilled therapeutic intervention in order to improve the following deficits and impairments:  Dizziness, Abnormal gait, Difficulty walking, Decreased activity tolerance, Decreased balance, Decreased mobility, Postural dysfunction, Impaired vision/preception  Visit Diagnosis: Dizziness and giddiness  Unsteadiness on feet  Other abnormalities of gait and mobility     Problem List Patient Active Problem List   Diagnosis Date Noted   Vertigo 08/28/2021   PAF (paroxysmal atrial fibrillation) ([place]) 08/28/2021   Hematoma of left thigh 02/17/2021   Elevated LFTs    Acute epigastric pain 02/04/2017   HTN (hypertension) 09/27/2013   SNHL (sensorineural hearing loss) 05/24/2013   CAD (coronary artery disease) 04/18/2013   Chronic anticoagulation 04/18/2013   GERD (gastroesophageal reflux disease) 04/18/2013   History of pulmonary embolism 04/18/2013   Blood clotting disorder ([place]) 06/12/2012   Heterozygous factor V Leiden mutation ([place]) 06/12/2012   Prothrombin gene mutation ([place]) 06/12/2012   Antiphospholipid antibody syndrome ([place]) 06/12/2012   Protein S deficiency ([place]) 06/12/2012   DVT, recurrent, lower extremity, chronic 06/12/2012   Pulmonary embolism, bilateral ([place])  06/12/2012   Varicose veins 06/12/2012   Chronic venous embolism and thrombosis of deep vessels of lower extremity ([place]) 06/12/2012   Primary hypercoagulable state ([place]) 06/12/2012   Aneurysm of abdominal vessel 03/12/2012   Dissection of aorta, abdominal ([place]) 03/12/2012   ABNORMALITY OF GAIT 07/02/2009   SPRAIN&STRAIN OTHER SPECIFIED SITES KNEE&LEG 07/02/2009    Janene Harvey, PT, DPT 10/28/21 3:46 PM   [facility] [address] [place], [place],  [address] Phone: [phone]   Fax:  [phone]  Name: Hagan Vanauken MRN: [id number] Date of Birth: [birth year]

## 2021-11-01 ENCOUNTER — Ambulatory Visit: Payer: Medicare Other | Admitting: Physical Therapy

## 2021-11-01 ENCOUNTER — Other Ambulatory Visit: Payer: Self-pay

## 2021-11-01 ENCOUNTER — Encounter: Payer: Self-pay | Admitting: Physical Therapy

## 2021-11-01 DIAGNOSIS — R2689 Other abnormalities of gait and mobility: Secondary | ICD-10-CM

## 2021-11-01 DIAGNOSIS — R2681 Unsteadiness on feet: Secondary | ICD-10-CM

## 2021-11-01 DIAGNOSIS — R42 Dizziness and giddiness: Secondary | ICD-10-CM | POA: Diagnosis not present

## 2021-11-01 NOTE — Therapy (Signed)
[facility] [address], [place], [place], [address] Phone: [phone]   Fax:  [phone]  Physical Therapy Discharge Summary  Patient Details  Name: Don Ruiz MRN: [id number] Date of Birth: 01/18/1951 Referring Provider (PT): Gerlean Ren MD is hospitalist/ Dr. Marlou Porch MD is primary care  Progress Note Reporting Period 10/12/21 to 11/01/21  See note below for Objective Data and Assessment of Progress/Goals.    Encounter Date: 11/01/2021   PT End of Session - 11/01/21 1550     Visit Number 15    Number of Visits 17    Date for PT Re-Evaluation 11/02/21    Authorization Type [employer] Medicare    Progress Note Due on Visit 10    PT Start Time 1315    PT Stop Time 1358    PT Time Calculation (min) 43 min    Equipment Utilized During Treatment Gait belt    Activity Tolerance Patient tolerated treatment well    Behavior During Therapy WFL for tasks assessed/performed             Past Medical History:  Diagnosis Date   Ankle impingement syndrome    Right ankle from bone spur   Antiphospholipid antibody syndrome ([place]) 06/12/2012   Coagulopathy ([place]) 06/12/2012   DVT (deep venous thrombosis) ([place]) 1990, 2002   chronic hypercoagulobility.    DVT, recurrent, lower extremity, chronic 06/12/2012   1992   Dysrhythmia    AF   GERD (gastroesophageal reflux disease)    H/O tinnitus    Heterozygous factor V Leiden mutation ([place]) 06/12/2012   Hyperlipidemia    Migraine headache    Protein S deficiency ([place]) 06/12/2012   Prothrombin gene mutation ([place]) 06/12/2012   Pulmonary embolism ([place])    Pulmonary embolism, bilateral ([place]) 06/12/2012   July, 2002   PVC (premature ventricular contraction)    Varicose veins 06/12/2012   RLE   Wears hearing aid     Past Surgical History:  Procedure Laterality Date   ABDOMINAL AORTIC ANEURYSM REPAIR  12/14   [facility]   ANKLE SURGERY  2005   right   APPENDECTOMY     ARTERY BIOPSY Left  08/12/2014   Procedure: LEFT TEMPORAL ARTERY BIOPSY ;  Surgeon: Coralie Keens, MD;  Location: [facility];  Service: General;  Laterality: Left;   CHOLECYSTECTOMY  2008   lap choli   COLONOSCOPY N/A 09/03/2013   Procedure: COLONOSCOPY;  Surgeon: Garlan Fair, MD;  Location: [facility];  Service: Endoscopy;  Laterality: N/A;   FINGER SURGERY     Right 1st finger,  by Dr. Kalman Shan TENDON REPAIR  2013   left 5th finger by Dr. Caralyn Guile   INCISION AND DRAINAGE HIP Left 02/17/2021   Procedure: IRRIGATION AND DEBRIDEMENT LEFT THIGH;  Surgeon: Dorna Leitz, MD;  Location: [facility];  Service: Orthopedics;  Laterality: Left;   PATELLA RELEASE AND MANIPULATION  1992   Right [place]    There were no vitals filed for this visit.   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 Reports 75-80% improvement since initial eval. Still notes some movements bring on dizziness. Reports that he has returned to 85% of his gym routine except for bench press.    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    Currently in Pain? No/denies                [facility] PT Assessment - 11/01/21 0001       Standardized Balance Assessment   Standardized Balance Assessment 10 meter walk test    10 Meter Walk 5.65 sec   5.8 ft/sec     Berg Balance Test   Sit to Stand Able to stand without using hands and stabilize independently    Standing Unsupported Able to stand safely 2 minutes    Sitting with Back Unsupported but Feet Supported on Floor or Stool Able to sit safely and securely 2 minutes    Stand to Sit Sits safely with minimal use of hands    Transfers Able to transfer safely, minor use of hands    Standing  Unsupported with Eyes Closed Able to stand 10 seconds safely    Standing Unsupported with Feet Together Able to place feet together independently and stand 1 minute safely    From Standing, Reach Forward with Outstretched Arm Can reach confidently >25 cm (10")    From Standing Position, Pick up Object from Floor Able to pick up shoe safely and easily    From Standing Position, Turn to Look Behind Over each Shoulder Looks behind from both sides and weight shifts well    Turn 360 Degrees Able to turn 360 degrees safely in 4 seconds or less    Standing Unsupported, Alternately Place Feet on Step/Stool Able to stand independently and safely and complete 8 steps in 20 seconds    Standing Unsupported, One Foot in [place] to place foot tandem independently and hold 30 seconds    Standing on One Leg Able to lift leg independently and hold > 10 seconds    Total Score 56                           [facility] Adult PT Treatment/Exercise - 11/01/21 0001       Ambulation/Gait   Gait Comments running outside on grass and paved surfaces x260f with mild instability once stopped             Vestibular Treatment/Exercise - 11/01/21 0001       X1 Viewing Horizontal   Foot Position standing    Reps --   60 sec   Comments 2/10 dizziness      X1 Viewing Vertical   Foot Position standing    Reps --   60 sec   Comments 1/10 dizziness                    PT Education - 11/01/21 1549     Education Details consolidation/review of HEP-Access Code: [email or web address]; discussion on progress with PT and PT goals and encouraged continued fitness including yard work, elliptical, and short durations of running on track to improve strength of vestibular system    Person(s) Educated Patient    Methods Explanation;Demonstration;Tactile cues;Verbal cues;Handout    Comprehension Verbalized understanding;Returned demonstration              PT Short Term Goals - 11/01/21 1551       PT  SHORT TERM GOAL #1   Title The patient will be indep with HEP for gaze adaptation, habituation, balance, and general mobility.    Time 4    Period  Weeks    Status Achieved    Target Date 10/03/21      PT SHORT TERM GOAL #2   Title The patient will be able to move sit<>stand 5 reps without UE use, and without use of RW mod indep.    Time 4    Period Weeks    Status Achieved    Target Date 10/03/21      PT SHORT TERM GOAL #3   Title The patient will ambulate mod indep with RW in home and community x 300 ft without loss of balance.    Baseline *The patient reports he was safe with the walker and has progressed to no device -- intermittent UE support on walls/furniture 09/13/21.    Time 4    Period Weeks    Status Achieved    Target Date 10/03/21      PT SHORT TERM GOAL #4   Title The patient will tolerate gaze adaptation x 1 viewing x 30 seconds in seated or standing position.    Baseline tolerated 20 seconds today 09/27/21    Time 4    Period Weeks    Status Achieved   tolerated 60 sec   Target Date 10/03/21      PT SHORT TERM GOAL #5   Title The patient will demonstrate bed mobility with dizziness < or equal to 2/10.    Baseline reports falling posteriorly with sit>supine.    Time 4    Period Weeks    Status Achieved    Target Date 10/03/21      PT SHORT TERM GOAL #6   Title PT to further assess Berg and/or FGA when appropriate.    Time 4    Period Weeks    Status Achieved    Target Date 10/03/21               PT Long Term Goals - 11/01/21 1552       PT LONG TERM GOAL #1   Title The patient will return to modified gym routine.    Time 8    Period Weeks    Status Achieved    Target Date 11/02/21      PT LONG TERM GOAL #2   Title The patient will ambulate without an assistive device for household and community distances independently >500 ft.    Time 8    Period Weeks    Status Achieved   reports that he completes 5 miles over the weekend   Target Date  11/02/21      PT LONG TERM GOAL #3   Title The patient will tolerate gaze x 1 adaptation x 60 seconds without c/o dizziness.    Time 8    Period Weeks    Status Achieved   tolerated 60 sec   Target Date 11/02/21      PT LONG TERM GOAL #4   Title The patient will improve Berg balance testing by 8 points.    Baseline 44/56    Time 8    Period Weeks    Status Achieved   56/56   Target Date 11/02/21      PT LONG TERM GOAL #5   Title The patient will improve gait speed to > or equal to 2.7 ft/sec demonstrating dec'd fall risk and return to full community ambulator classification of gait.    Time 8    Period Weeks    Status Achieved   5.8 ft/sec   Target Date 11/02/21  Plan - 11/01/21 1555     Clinical Impression Statement Patient arrived to session with report of 75-80% improvement since initial eval. 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 Reports that he has returned to 85% of his gym routine except for bench press. Patient scored 56/56 on Berg, indicating a decreased risk of falls. Was able to demonstrate a gait speed of 5.8 ft/sec on 39M walk test. Reports completing a 5 mile walk over the weekend, only with some HA remaining. Able to complete VOR for 1 minute with 1-2/10 dizziness. Trialed short duration of running outside on grass and paved surfaces with good stability while running, mild sway once stopped. Patient has met or partially met all goals at this time. Ready to D/C with transition to home program and fitness regimen to tolerance.    Comorbidities prior h/o R sensorineural hearing loss (unsure if vestibular nerve impairment), h/o clotting disorder, L hearing absent    PT Frequency 2x / week    PT Duration 8 weeks    PT Treatment/Interventions ADLs/Self Care Home Management;Patient/family education;Neuromuscular re-education;Balance training;Therapeutic exercise;Therapeutic activities;Functional  mobility training;Gait training;Stair training;Manual techniques;Vestibular    PT Next Visit Plan DC at this time    Consulted and Agree with Plan of Care Patient             Patient will benefit from skilled therapeutic intervention in order to improve the following deficits and impairments:  Dizziness, Abnormal gait, Difficulty walking, Decreased activity tolerance, Decreased balance, Decreased mobility, Postural dysfunction, Impaired vision/preception  Visit Diagnosis: Dizziness and giddiness  Unsteadiness on feet  Other abnormalities of gait and mobility     Problem List Patient Active Problem List   Diagnosis Date Noted   Vertigo 08/28/2021   PAF (paroxysmal atrial fibrillation) ([place]) 08/28/2021   Hematoma of left thigh 02/17/2021   Elevated LFTs    Acute epigastric pain 02/04/2017   HTN (hypertension) 09/27/2013   SNHL (sensorineural hearing loss) 05/24/2013   CAD (coronary artery disease) 04/18/2013   Chronic anticoagulation 04/18/2013   GERD (gastroesophageal reflux disease) 04/18/2013   History of pulmonary embolism 04/18/2013   Blood clotting disorder ([place]) 06/12/2012   Heterozygous factor V Leiden mutation ([place]) 06/12/2012   Prothrombin gene mutation ([place]) 06/12/2012   Antiphospholipid antibody syndrome ([place]) 06/12/2012   Protein S deficiency ([place]) 06/12/2012   DVT, recurrent, lower extremity, chronic 06/12/2012   Pulmonary embolism, bilateral ([place]) 06/12/2012   Varicose veins 06/12/2012   Chronic venous embolism and thrombosis of deep vessels of lower extremity ([place]) 06/12/2012   Primary hypercoagulable state ([place]) 06/12/2012   Aneurysm of abdominal vessel 03/12/2012   Dissection of aorta, abdominal ([place]) 03/12/2012   ABNORMALITY OF GAIT 07/02/2009   SPRAIN&STRAIN OTHER SPECIFIED SITES KNEE&LEG 07/02/2009    Janene Harvey, PT, DPT 11/01/21 3:58 PM   [facility] [address] [place],  [place], [address] Phone: [phone]   Fax:  [phone]  Name: Roland Lipke MRN: [id number] Date of Birth: 18-Jan-1951

## 2021-11-02 ENCOUNTER — Ambulatory Visit: Payer: 59 | Admitting: Physical Therapy

## 2021-11-11 ENCOUNTER — Other Ambulatory Visit: Payer: Self-pay | Admitting: Internal Medicine

## 2021-11-11 DIAGNOSIS — R1013 Epigastric pain: Secondary | ICD-10-CM

## 2021-11-29 ENCOUNTER — Ambulatory Visit
Admission: RE | Admit: 2021-11-29 | Discharge: 2021-11-29 | Disposition: A | Discharge status: Home / Self Care | Payer: Medicare Other | Source: Ambulatory Visit | Attending: Internal Medicine | Admitting: Internal Medicine

## 2021-11-29 ENCOUNTER — Other Ambulatory Visit: Payer: Self-pay

## 2021-11-29 DIAGNOSIS — R1013 Epigastric pain: Secondary | ICD-10-CM

## 2021-11-29 MED ORDER — GADOBENATE DIMEGLUMINE 529 MG/ML IV SOLN
20.0000 mL | Freq: Once | INTRAVENOUS | Status: AC | PRN
  Administered 2021-11-29: 20 mL via INTRAVENOUS

## 2021-12-06 ENCOUNTER — Other Ambulatory Visit: Payer: 59

## 2021-12-21 ENCOUNTER — Other Ambulatory Visit: Payer: Self-pay | Admitting: Gastroenterology

## 2021-12-29 ENCOUNTER — Encounter (HOSPITAL_COMMUNITY): Payer: Self-pay | Admitting: Gastroenterology

## 2021-12-29 NOTE — Progress Notes (Signed)
Attempted to obtain medical history via telephone, unable to reach at this time. I left a voicemail to return pre surgical testing department's phone call.  

## 2022-01-03 ENCOUNTER — Other Ambulatory Visit: Payer: 59

## 2022-01-05 ENCOUNTER — Ambulatory Visit (HOSPITAL_COMMUNITY): Payer: Medicare Other | Admitting: Anesthesiology

## 2022-01-05 ENCOUNTER — Other Ambulatory Visit: Payer: Self-pay

## 2022-01-05 ENCOUNTER — Ambulatory Visit (HOSPITAL_BASED_OUTPATIENT_CLINIC_OR_DEPARTMENT_OTHER): Payer: Medicare Other | Admitting: Anesthesiology

## 2022-01-05 ENCOUNTER — Encounter (HOSPITAL_COMMUNITY): Payer: Self-pay | Admitting: Gastroenterology

## 2022-01-05 ENCOUNTER — Encounter (HOSPITAL_COMMUNITY)
Admission: RE | Disposition: A | Discharge status: Home / Self Care | Payer: Self-pay | Source: Ambulatory Visit | Attending: Gastroenterology

## 2022-01-05 ENCOUNTER — Ambulatory Visit (HOSPITAL_COMMUNITY)
Admission: RE | Admit: 2022-01-05 | Discharge: 2022-01-05 | Disposition: A | Discharge status: Home / Self Care | Payer: Medicare Other | Source: Ambulatory Visit | Attending: Gastroenterology | Admitting: Gastroenterology

## 2022-01-05 DIAGNOSIS — Z86711 Personal history of pulmonary embolism: Secondary | ICD-10-CM | POA: Insufficient documentation

## 2022-01-05 DIAGNOSIS — Z86718 Personal history of other venous thrombosis and embolism: Secondary | ICD-10-CM | POA: Insufficient documentation

## 2022-01-05 DIAGNOSIS — D6851 Activated protein C resistance: Secondary | ICD-10-CM | POA: Insufficient documentation

## 2022-01-05 DIAGNOSIS — R1013 Epigastric pain: Secondary | ICD-10-CM | POA: Diagnosis not present

## 2022-01-05 DIAGNOSIS — D6859 Other primary thrombophilia: Secondary | ICD-10-CM | POA: Diagnosis not present

## 2022-01-05 DIAGNOSIS — Z7901 Long term (current) use of anticoagulants: Secondary | ICD-10-CM | POA: Diagnosis not present

## 2022-01-05 DIAGNOSIS — I10 Essential (primary) hypertension: Secondary | ICD-10-CM | POA: Diagnosis not present

## 2022-01-05 DIAGNOSIS — I2510 Atherosclerotic heart disease of native coronary artery without angina pectoris: Secondary | ICD-10-CM | POA: Insufficient documentation

## 2022-01-05 DIAGNOSIS — I4891 Unspecified atrial fibrillation: Secondary | ICD-10-CM | POA: Insufficient documentation

## 2022-01-05 DIAGNOSIS — K219 Gastro-esophageal reflux disease without esophagitis: Secondary | ICD-10-CM | POA: Diagnosis not present

## 2022-01-05 DIAGNOSIS — Z9049 Acquired absence of other specified parts of digestive tract: Secondary | ICD-10-CM | POA: Insufficient documentation

## 2022-01-05 HISTORY — PX: UPPER ESOPHAGEAL ENDOSCOPIC ULTRASOUND (EUS): SHX6562

## 2022-01-05 HISTORY — PX: ESOPHAGOGASTRODUODENOSCOPY: SHX5428

## 2022-01-05 SURGERY — UPPER ESOPHAGEAL ENDOSCOPIC ULTRASOUND (EUS)
Anesthesia: General

## 2022-01-05 MED ORDER — ONDANSETRON HCL 4 MG/2ML IJ SOLN
INTRAMUSCULAR | Status: DC | PRN
  Administered 2022-01-05: 4 mg via INTRAVENOUS

## 2022-01-05 MED ORDER — AMISULPRIDE (ANTIEMETIC) 5 MG/2ML IV SOLN: 10.0000 mg | Freq: Once | INTRAVENOUS | Status: DC | PRN

## 2022-01-05 MED ORDER — WARFARIN SODIUM 5 MG PO TABS: 2.5000 mg | ORAL_TABLET | ORAL | Status: AC

## 2022-01-05 MED ORDER — OXYCODONE HCL 5 MG PO TABS: 5.0000 mg | ORAL_TABLET | Freq: Once | ORAL | Status: DC | PRN

## 2022-01-05 MED ORDER — OXYCODONE HCL 5 MG/5ML PO SOLN: 5.0000 mg | Freq: Once | ORAL | Status: DC | PRN

## 2022-01-05 MED ORDER — FENTANYL CITRATE (PF) 100 MCG/2ML IJ SOLN: 25.0000 ug | INTRAMUSCULAR | Status: DC | PRN

## 2022-01-05 MED ORDER — CIPROFLOXACIN IN D5W 400 MG/200ML IV SOLN
INTRAVENOUS | Status: AC
Start: 2022-01-05 — End: ?
  Filled 2022-01-05: qty 200

## 2022-01-05 MED ORDER — PROPOFOL 500 MG/50ML IV EMUL
INTRAVENOUS | Status: DC | PRN
  Administered 2022-01-05: 125 ug/kg/min via INTRAVENOUS

## 2022-01-05 MED ORDER — LACTATED RINGERS IV SOLN: INTRAVENOUS | Status: DC

## 2022-01-05 MED ORDER — SODIUM CHLORIDE 0.9 % IV SOLN: INTRAVENOUS | Status: DC

## 2022-01-05 MED ORDER — ONDANSETRON HCL 4 MG/2ML IJ SOLN: 4.0000 mg | Freq: Once | INTRAMUSCULAR | Status: DC | PRN

## 2022-01-05 MED ORDER — LIDOCAINE HCL 1 % IJ SOLN
INTRAMUSCULAR | Status: DC | PRN
  Administered 2022-01-05: 50 mg via INTRADERMAL

## 2022-01-05 MED ORDER — PROPOFOL 10 MG/ML IV BOLUS
INTRAVENOUS | Status: DC | PRN
  Administered 2022-01-05: 10 mg via INTRAVENOUS
  Administered 2022-01-05 (×2): 20 mg via INTRAVENOUS

## 2022-01-05 NOTE — Anesthesia Postprocedure Evaluation (Signed)
Anesthesia Post Note ? ?Patient: Don Ruiz ? ?Procedure(s) Performed: UPPER ESOPHAGEAL ENDOSCOPIC ULTRASOUND (EUS) (Bilateral) ?ESOPHAGOGASTRODUODENOSCOPY (EGD) ? ?  ? ?Patient location during evaluation: PACU ?Anesthesia Type: MAC ?Level of consciousness: awake and alert ?Pain management: pain level controlled ?Vital Signs Assessment: post-procedure vital signs reviewed and stable ?Respiratory status: spontaneous breathing and respiratory function stable ?Cardiovascular status: stable ?Postop Assessment: no apparent nausea or vomiting ?Anesthetic complications: no ? ? ?No notable events documented. ? ?Last Vitals:  ?Vitals:  ? 01/05/22 1110 01/05/22 1120  ?BP: 129/78 134/79  ?Pulse: (!) 58 (!) 56  ?Resp: 16 18  ?Temp: 36.6 ?C   ?SpO2: 100% 98%  ?  ?Last Pain:  ?Vitals:  ? 01/05/22 1120  ?TempSrc:   ?PainSc: 0-No pain  ? ? ?  ?  ?  ?  ?  ?  ? ?Merlinda Frederick ? ? ? ? ?

## 2022-01-05 NOTE — Anesthesia Preprocedure Evaluation (Addendum)
Anesthesia Evaluation  ?Patient identified by MRN, date of birth, ID band ?Patient awake ? ? ? ?Reviewed: ?Allergy & Precautions, NPO status , Patient's Chart, lab work & pertinent test results ? ?Airway ?Mallampati: I ? ?TM Distance: >3 FB ?Neck ROM: Full ? ? ? Dental ?no notable dental hx. ? ?  ?Pulmonary ?neg pulmonary ROS,  ?  ?Pulmonary exam normal ?breath sounds clear to auscultation ? ? ? ? ? ? Cardiovascular ?hypertension, + CAD and + DVT (h/o multiple DVT, h/o PE)  ?Normal cardiovascular exam+ dysrhythmias Atrial Fibrillation  ?Rhythm:Regular Rate:Normal ? ? ?  ?Neuro/Psych ? Headaches, negative psych ROS  ? GI/Hepatic ?Neg liver ROS, GERD  ,  ?Endo/Other  ?negative endocrine ROS ? Renal/GU ?negative Renal ROS  ?negative genitourinary ?  ?Musculoskeletal ?negative musculoskeletal ROS ?(+)  ? Abdominal ?  ?Peds ?negative pediatric ROS ?(+)  Hematology ? ?(+) Blood dyscrasia (Factor V leiden; antiphospholipid antibody syndrome, protein S deficiency), , On warfarin; last dose 12/30/21   ?Anesthesia Other Findings ? ? Reproductive/Obstetrics ?negative OB ROS ? ?  ? ? ? ? ? ? ? ? ? ? ? ? ? ?  ?  ? ? ? ? ? ? ? ?Anesthesia Physical ?Anesthesia Plan ? ?ASA: 3 ? ?Anesthesia Plan: General  ? ?Post-op Pain Management:   ? ?Induction: Intravenous ? ?PONV Risk Score and Plan: 2 and Ondansetron, Dexamethasone, Midazolam and Treatment may vary due to age or medical condition ? ?Airway Management Planned: Oral ETT ? ?Additional Equipment:  ? ?Intra-op Plan:  ? ?Post-operative Plan: Extubation in OR ? ?Informed Consent: I have reviewed the patients History and Physical, chart, labs and discussed the procedure including the risks, benefits and alternatives for the proposed anesthesia with the patient or authorized representative who has indicated his/her understanding and acceptance.  ? ? ? ?Dental advisory given ? ?Plan Discussed with: CRNA, Anesthesiologist and Surgeon ? ?Anesthesia Plan  Comments:   ? ? ? ? ? ? ?Anesthesia Quick Evaluation ? ?

## 2022-01-05 NOTE — Transfer of Care (Signed)
Immediate Anesthesia Transfer of Care Note ? ?Patient: Don Ruiz ? ?Procedure(s) Performed: UPPER ESOPHAGEAL ENDOSCOPIC ULTRASOUND (EUS) (Bilateral) ?ESOPHAGOGASTRODUODENOSCOPY (EGD) ? ?Patient Location: PACU and Endoscopy Unit ? ?Anesthesia Type:MAC ? ?Level of Consciousness: oriented, drowsy and patient cooperative ? ?Airway & Oxygen Therapy: Patient Spontanous Breathing and Patient connected to face mask oxygen ? ?Post-op Assessment: Report given to RN and Post -op Vital signs reviewed and stable ? ?Post vital signs: Reviewed and stable ? ?Last Vitals:  ?Vitals Value Taken Time  ?BP 125/75 01/05/22 1100  ?Temp    ?Pulse 50 01/05/22 1102  ?Resp 14 01/05/22 1102  ?SpO2 100 % 01/05/22 1102  ?Vitals shown include unvalidated device data. ? ?Last Pain:  ?Vitals:  ? 01/05/22 0934  ?TempSrc: Oral  ?PainSc: 0-No pain  ?   ? ?  ? ?Complications: No notable events documented. ?

## 2022-01-05 NOTE — Discharge Instructions (Signed)
Endoscopy °Care After °Please read the instructions outlined below and refer to this sheet in the next few weeks. These discharge instructions provide you with general information on caring for yourself after you leave the hospital. Your doctor may also give you specific instructions. While your treatment has been planned according to the most current medical practices available, unavoidable complications occasionally occur. If you have any problems or questions after discharge, please call Dr. Sabreena Vogan ([facility]) at [phone]. ° °HOME CARE INSTRUCTIONS °Activity °· You may resume your regular activity but move at a slower pace for the next 24 hours.  °· Take frequent rest periods for the next 24 hours.  °· Walking will help expel (get rid of) the air and reduce the bloated feeling in your abdomen.  °· No driving for 24 hours (because of the anesthesia (medicine) used during the test).  °· You may shower.  °· Do not sign any important legal documents or operate any machinery for 24 hours (because of the anesthesia used during the test).  °Nutrition °· Drink plenty of fluids.  °· You may resume your normal diet.  °· Begin with a light meal and progress to your normal diet.  °· Avoid alcoholic beverages for 24 hours or as instructed by your caregiver.  °Medications °You may resume your normal medications unless your caregiver tells you otherwise. °What you can expect today °· You may experience abdominal discomfort such as a feeling of fullness or "gas" pains.  °· You may experience a sore throat for 2 to 3 days. This is normal. Gargling with salt water may help this.  °·  °SEEK IMMEDIATE MEDICAL CARE IF: °· You have excessive nausea (feeling sick to your stomach) and/or vomiting.  °· You have severe abdominal pain and distention (swelling).  °· You have trouble swallowing.  °· You have a temperature over 100° F (37.8° C).  °· You have rectal bleeding or vomiting of blood.  °Document Released:  04/05/2004 Document Revised: 05/04/2011 Document Reviewed: 10/17/2007 °ExitCare® Patient Information ©2012 [employer]. °

## 2022-01-05 NOTE — Op Note (Signed)
[facility] ?Patient Name: Don Ruiz ?Procedure Date: 01/05/2022 ?MRN: [id number] ?Attending MD: Arta Silence , MD ?Date of Birth: [birth year] ?CSN: [id number] ?Age: 71 ?Admit Type: Outpatient ?Procedure:                Upper EUS ?Indications:              Suspected choledocholithiasis, Epigastric abdominal  ?                          pain ?Providers:                Arta Silence, MD, Grace Isaac, RN, Tyna Jaksch  ?                          Technician ?Referring MD:             Dr. Lavone Orn ?Medicines:                Monitored Anesthesia Care ?Complications:            No immediate complications. ?Estimated Blood Loss:     Estimated blood loss: none. ?Procedure:                Pre-Anesthesia Assessment: ?                          - Prior to the procedure, a History and Physical  ?                          was performed, and patient medications and  ?                          allergies were reviewed. The patient's tolerance of  ?                          previous anesthesia was also reviewed. The risks  ?                          and benefits of the procedure and the sedation  ?                          options and risks were discussed with the patient.  ?                          All questions were answered, and informed consent  ?                          was obtained. Prior Anticoagulants: The patient has  ?                          taken Coumadin (warfarin), last dose was 6 days  ?                          prior to procedure, and Lovenox bridge, last dose  ?                          yesterday morning. ASA  Grade III. After reviewing  ?                          the risks and benefits, the patient was deemed in  ?                          satisfactory condition to undergo the procedure. ?                          After obtaining informed consent, the endoscope was  ?                          passed under direct vision. Throughout the  ?                          procedure, the patient's  blood pressure, pulse, and  ?                          oxygen saturations were monitored continuously. The  ?                          [id number] ([id number]) Olympus radial ultrasound  ?                          scope was introduced through the mouth, and  ?                          advanced to the second part of duodenum. ?Scope In: ?Scope Out: ?Findings: ?     ENDOSCOPIC FINDING: : ?     The examined esophagus was normal. ?     The entire examined stomach was normal. ?     The duodenal bulb, first portion of the duodenum and second portion of  ?     the duodenum were normal. ?     ENDOSONOGRAPHIC FINDING: : ?     There was no sign of significant endosonographic abnormality in the  ?     common bile duct. The maximum diameter of the duct was 9 mm. No bile  ?     duct stones were identified. ?     Evidence of a previous cholecystectomy was identified  ?     endosonographically. ?     No lymphadenopathy seen. ?     There was no sign of significant endosonographic abnormality in the left  ?     lobe of the liver. ?     There was no sign of significant endosonographic abnormality in the  ?     ampulla. ?     There was no sign of significant endosonographic abnormality in the  ?     entire pancreas. The pancreatic duct measured up to 2 mm in diameter. ?Impression:               - Normal esophagus. ?                          - Normal stomach. ?                          -  Normal duodenal bulb, first portion of the  ?                          duodenum and second portion of the duodenum. ?                          - There was no sign of significant pathology in the  ?                          common bile duct. ?                          - Evidence of a cholecystectomy. ?                          - There was no evidence of significant pathology in  ?                          the left lobe of the liver. ?                          - There was no sign of significant pathology in the  ?                          ampulla. ?                           - There was no sign of significant pathology in the  ?                          entire pancreas. ?                          - No explanations for symptoms noted. No  ?                          choledocholithiasis, ulcer, esophagitis, gastritis,  ?                          duodenitis. ?Moderate Sedation: ?     None ?Recommendation:           - Discharge patient to home (via wheelchair). ?                          - Resume previous diet today. ?                          - Continue present medications. ?                          - Return to GI clinic at appointment to be  ?                          scheduled. ?                          -  Return to referring physician as previously  ?                          scheduled. ?                          - Resume warfarin and lovenox bridge today. ?Procedure Code(s):        --- Professional --- ?                          [phone], Esophagogastroduodenoscopy, flexible,  ?                          transoral; with endoscopic ultrasound examination,  ?                          including the esophagus, stomach, and either the  ?                          duodenum or a surgically altered stomach where the  ?                          jejunum is examined distal to the anastomosis ?Diagnosis Code(s):        --- Professional --- ?                          Z90.49, Acquired absence of other specified parts  ?                          of digestive tract ?                          R10.13, Epigastric pain ?CPT copyright 2019 American Medical Association. All rights reserved. ?The codes documented in this report are preliminary and upon coder review may  ?be revised to meet current compliance requirements. ?Arta Silence, MD ?01/05/2022 11:17:09 AM ?This report has been signed electronically. ?Number of Addenda: 0 ?

## 2022-01-05 NOTE — H&P (Signed)
[facility] H/P Note ? ?Chief Complaint: abdominal pain ? ?HPI: Don Ruiz is an 71 y.o. male.  Intermittent abdominal pain.  Concern choledocholithiasis. ? ?Past Medical History:  ?Diagnosis Date  ? Ankle impingement syndrome   ? Right ankle from bone spur  ? Antiphospholipid antibody syndrome ([place]) 06/12/2012  ? Coagulopathy ([place]) 06/12/2012  ? DVT (deep venous thrombosis) ([place]) 1990, 2002  ? chronic hypercoagulobility.   ? DVT, recurrent, lower extremity, chronic 06/12/2012  ? 1992  ? Dysrhythmia   ? AF  ? GERD (gastroesophageal reflux disease)   ? H/O tinnitus   ? Heterozygous factor V Leiden mutation ([place]) 06/12/2012  ? Hyperlipidemia   ? Migraine headache   ? Protein S deficiency ([place]) 06/12/2012  ? Prothrombin gene mutation ([place]) 06/12/2012  ? Pulmonary embolism ([place])   ? Pulmonary embolism, bilateral ([place]) 06/12/2012  ? July, 2002  ? PVC (premature ventricular contraction)   ? Varicose veins 06/12/2012  ? RLE  ? Wears hearing aid   ? ? ?Past Surgical History:  ?Procedure Laterality Date  ? ABDOMINAL AORTIC ANEURYSM REPAIR  12/14  ? [facility]  ? ANKLE SURGERY  2005  ? right  ? APPENDECTOMY    ? ARTERY BIOPSY Left 08/12/2014  ? Procedure: LEFT TEMPORAL ARTERY BIOPSY ;  Surgeon: Coralie Keens, MD;  Location: [facility];  Service: General;  Laterality: Left;  ? CHOLECYSTECTOMY  2008  ? lap choli  ? COLONOSCOPY N/A 09/03/2013  ? Procedure: COLONOSCOPY;  Surgeon: Garlan Fair, MD;  Location: [facility];  Service: Endoscopy;  Laterality: N/A;  ? FINGER SURGERY    ? Right 1st finger,  by Dr. Sharol Given  ? FINGER TENDON REPAIR  2013  ? left 5th finger by Dr. Caralyn Guile  ? INCISION AND DRAINAGE HIP Left 02/17/2021  ? Procedure: IRRIGATION AND DEBRIDEMENT LEFT THIGH;  Surgeon: Dorna Leitz, MD;  Location: [facility];  Service: Orthopedics;  Laterality: Left;  ? PATELLA RELEASE AND MANIPULATION  1992  ? Right Patella  ? ? ?Medications Prior to Admission  ?Medication Sig Dispense Refill  ? acetaminophen  (TYLENOL) 325 MG tablet Take 2 tablets (650 mg total) by mouth every 6 (six) hours as needed for mild pain (or Fever >/= 101). (Patient taking differently: Take 1,000 mg by mouth every 6 (six) hours as needed for mild pain (or Fever >/= 101).)    ? celecoxib (CELEBREX) 200 MG capsule Take 200 mg by mouth once a week.    ? diphenhydramine-acetaminophen (TYLENOL PM) 25-500 MG TABS tablet Take 2 tablets by mouth at bedtime as needed (for sleep).    ? ezetimibe (ZETIA) 10 MG tablet Take 10 mg by mouth daily.    ? gabapentin (NEURONTIN) 600 MG tablet Take 300-600 mg by mouth See admin instructions. Take 600 in the morning and 300 at night    ? midodrine (PROAMATINE) 5 MG tablet Take 5 mg by mouth 3 (three) times daily.    ? Multiple Vitamin (MULITIVITAMIN WITH MINERALS) TABS Take 1 tablet by mouth daily.    ? pravastatin (PRAVACHOL) 80 MG tablet Take 80 mg by mouth daily.    ? Probiotic Product (PROBIOTIC PO) Take 1 tablet by mouth daily.    ? psyllium (METAMUCIL) 58.6 % packet Take 1 packet by mouth daily.    ? warfarin (COUMADIN) 5 MG tablet Take 2.5-5 mg by mouth See admin instructions. Take 5 mg for 2 days and 2.5 mg for one day and 5 mg 2 days repeating cycle    ?  diazepam (VALIUM) 5 MG tablet Take 2 tablets (10 mg total) by mouth every 8 (eight) hours as needed (vertigo). (Patient not taking: Reported on 12/29/2021) 42 tablet 0  ? diphenhydrAMINE (BENADRYL) 25 MG tablet Take 25 mg by mouth as needed for sleep.    ? nitroGLYCERIN (NITROSTAT) 0.3 MG SL tablet Place 0.3 mg under the tongue every 5 (five) minutes as needed for chest pain.    ? pantoprazole (PROTONIX) 40 MG tablet Take 1 tablet (40 mg total) by mouth daily. (Patient not taking: Reported on 12/29/2021) 30 tablet 0  ? ? ?Allergies:  ?Allergies  ?Allergen Reactions  ? Amoxicillin-Pot Clavulanate Other (See Comments)  ?  Gi upset  ? Aspirin Other (See Comments)  ?  On blood thinner, then blood to much  ? Niacin And Related Nausea Only  ?  Hepatitis  ?  Phisohex [Hexachlorophene]   ?  rash  ? Rosuvastatin Other (See Comments)  ?  Lost hearing in right ear  ? Topamax [Topiramate]   ?  suicidal  ? ? ?Family History  ?Problem Relation Age of Onset  ? Cancer Mother   ?     Breast cancer  ? Hypertension Mother   ? Diabetes Mother   ? Fibromyalgia Mother   ? Stroke Mother   ? Stroke Father   ? Heart disease Father   ? Alcohol abuse Sister   ? Hyperlipidemia Sister   ? Hypertension Sister   ? Heart disease Brother   ?     CABG at 52  ? Stroke Brother   ? Pulmonary embolism Brother   ? Heart disease Other   ? Hyperlipidemia Other   ? Heart disease Brother   ?     MI at 98 CABG  ? Heart disease Brother   ?     CABG,   MI at 3  ? Stroke Sister 41  ? Heart disease Sister   ? Cancer Sister   ?     Breast Cancer  ? Other Sister   ?     acoustic tumor  ? Deep vein thrombosis Sister   ? ? ?Social History:  reports that he has never smoked. He has never used smokeless tobacco. He reports that he does not drink alcohol and does not use drugs. ? ? ?ROS: As per HPI, all others negative ? ?Blood pressure (!) 145/84, pulse 62, temperature (!) 97.5 ?F (36.4 ?C), temperature source Oral, resp. rate 12, height '6\' 3"'$  (1.905 m), weight 93.9 kg, SpO2 98 %. ?General appearance: NAD ?HEENT:  [place]/AT, anicteric ?NECK:  Supple ?CV:  Regular ?ABD:  Soft, non-tender ?NEURO:  Non-focal, A/O x 4, no encephalopathy ? ?No results found for this or any previous visit (from the past 48 hour(s)). ?No results found. ? ?Assessment/Plan ? ? Intermittent abdominal pain.  Concern for choledocholithiasis. ?Chronic anticoagulation, off warfarin x 6 days, on Lovenox bridge (last dose yesterday morning). ?Risks (bleeding, infection, bowel perforation that could require surgery, sedation-related changes in cardiopulmonary systems), benefits (identification and possible treatment of source of symptoms, exclusion of certain causes of symptoms), and alternatives (watchful waiting, radiographic imaging studies, empiric  medical treatment) of upper endoscopy with ultrasound (EUS) were explained to patient/family in detail and patient wishes to proceed.  ?If cbd stone seen, would proceed with ercp. ?Risks (up to and including bleeding, infection, perforation, pancreatitis that can be complicated by infected necrosis and death), benefits (removal of stones, alleviating blockage, decreasing risk of cholangitis or choledocholithiasis-related pancreatitis), and  alternatives (watchful waiting, percutaneous transhepatic cholangiography) of ERCP were explained to patient/family in detail and patient elects to proceed.  ? ?Landry Dyke ?01/05/2022, 10:19 AM  ?

## 2022-01-07 ENCOUNTER — Encounter (HOSPITAL_COMMUNITY): Payer: Self-pay | Admitting: Gastroenterology

## 2022-02-01 NOTE — Therapy (Signed)
OUTPATIENT PHYSICAL THERAPY VESTIBULAR EVALUATION     Patient Name: Don Ruiz MRN: [id number] DOB:[birth year], 71 y.o., male Today's Date: 02/02/2022  PCP: Lavone Orn, MD  REFERRING PROVIDER: Lavone Orn, MD    PT End of Session - 02/02/22 1645     Visit Number 1    Number of Visits 13    Date for PT Re-Evaluation 03/16/22    Authorization Type [employer] Medicare    Progress Note Due on Visit 10    PT Start Time 0805    PT Stop Time 0842    PT Time Calculation (min) 37 min    Activity Tolerance Patient tolerated treatment well    Behavior During Therapy [facility] for tasks assessed/performed             Past Medical History:  Diagnosis Date   Ankle impingement syndrome    Right ankle from bone spur   Antiphospholipid antibody syndrome ([place]) 06/12/2012   Coagulopathy ([place]) 06/12/2012   DVT (deep venous thrombosis) ([place]) 1990, 2002   chronic hypercoagulobility.    DVT, recurrent, lower extremity, chronic 06/12/2012   1992   Dysrhythmia    AF   GERD (gastroesophageal reflux disease)    H/O tinnitus    Heterozygous factor V Leiden mutation ([place]) 06/12/2012   Hyperlipidemia    Migraine headache    Protein S deficiency ([place]) 06/12/2012   Prothrombin gene mutation ([place]) 06/12/2012   Pulmonary embolism ([place])    Pulmonary embolism, bilateral ([place]) 06/12/2012   July, 2002   PVC (premature ventricular contraction)    Varicose veins 06/12/2012   RLE   Wears hearing aid    Past Surgical History:  Procedure Laterality Date   ABDOMINAL AORTIC ANEURYSM REPAIR  12/14   [facility]   ANKLE SURGERY  2005   right   APPENDECTOMY     ARTERY BIOPSY Left 08/12/2014   Procedure: LEFT TEMPORAL ARTERY BIOPSY ;  Surgeon: Coralie Keens, MD;  Location: [facility];  Service: General;  Laterality: Left;   CHOLECYSTECTOMY  2008   lap choli   COLONOSCOPY N/A 09/03/2013   Procedure: COLONOSCOPY;  Surgeon: Garlan Fair, MD;  [facility];  Service: Endoscopy;  Laterality:  N/A;   ESOPHAGOGASTRODUODENOSCOPY N/A 01/05/2022   Procedure: ESOPHAGOGASTRODUODENOSCOPY (EGD);  Surgeon: Arta Silence, MD;  Location: [facility];  Service: Gastroenterology;  Laterality: N/A;   FINGER SURGERY     Right 1st finger,  by Dr. Kalman Shan TENDON REPAIR  2013   left 5th finger by Dr. Caralyn Guile   INCISION AND DRAINAGE HIP Left 02/17/2021   Procedure: IRRIGATION AND DEBRIDEMENT LEFT THIGH;  Surgeon: Dorna Leitz, MD;  [facility];  Service: Orthopedics;  Laterality: Left;   PATELLA RELEASE AND MANIPULATION  1992   Right Patella   UPPER ESOPHAGEAL ENDOSCOPIC ULTRASOUND (EUS) Bilateral 01/05/2022   Procedure: UPPER ESOPHAGEAL ENDOSCOPIC ULTRASOUND (EUS);  Surgeon: Arta Silence, MD;  Location: [facility];  Service: Gastroenterology;  Laterality: Bilateral;   Patient Active Problem List   Diagnosis Date Noted   Vertigo 08/28/2021   PAF (paroxysmal atrial fibrillation) ([place]) 08/28/2021   Hematoma of left thigh 02/17/2021   Elevated LFTs    Acute epigastric pain 02/04/2017   HTN (hypertension) 09/27/2013   SNHL (sensorineural hearing loss) 05/24/2013   CAD (coronary artery disease) 04/18/2013   Chronic anticoagulation 04/18/2013   GERD (gastroesophageal reflux disease) 04/18/2013   History of pulmonary embolism 04/18/2013   Blood clotting disorder ([place]) 06/12/2012   Heterozygous factor  V Leiden mutation ([place]) 06/12/2012   Prothrombin gene mutation ([place]) 06/12/2012   Antiphospholipid antibody syndrome ([place]) 06/12/2012   Protein S deficiency ([place]) 06/12/2012   DVT, recurrent, lower extremity, chronic 06/12/2012   Pulmonary embolism, bilateral ([place]) 06/12/2012   Varicose veins 06/12/2012   Chronic venous embolism and thrombosis of deep vessels of lower extremity ([place]) 06/12/2012   Primary hypercoagulable state ([place]) 06/12/2012   Aneurysm of abdominal vessel ([place]) 03/12/2012   Dissection of aorta, abdominal ([place]) 03/12/2012   ABNORMALITY OF GAIT 07/02/2009    SPRAIN&STRAIN OTHER SPECIFIED SITES KNEE&LEG 07/02/2009    ONSET DATE: 01/14/22  REFERRING DIAG: Q76.195 (ICD-10-CM) - Personal history of other venous thrombosis and embolism R42 (ICD-10-CM) - Dizziness and giddiness   THERAPY DIAG:  Dizziness and giddiness  Unsteadiness on feet  Other abnormalities of gait and mobility  Rationale for Evaluation and Treatment Rehabilitation  SUBJECTIVE:   SUBJECTIVE STATEMENT: Reports that he had L cochlear implant surgery on 01/11/22 and he woke up on the morning of the 12th and felt like his eyes were shooting all over the place. Had trouble walking but practiced some of his early exercises from previous PT POC to address some of his dizziness. Episodes last seconds-minutes and described as "disorientation".Worse with L head turns, riding in the car. Reports brain fog and feeling of constantly falling to the L. Reports he had some excessive bleeding after surgery d/t meds he is on for his clotting disorder.  Pt accompanied by: self  PERTINENT HISTORY: L cochlear implant, clotting disorder, R hearing loss, HTN   PAIN:  Are you having pain? No  PRECAUTIONS: Other: HOH, hears better in R ear  WEIGHT BEARING RESTRICTIONS No  FALLS: Has patient fallen in last 6 months? No; reports that he has tipped over a few times and was caught by the bed  LIVING ENVIRONMENT: Lives with: lives with their spouse Lives in: House/apartment Has following equipment at home:  walking sticks  PLOF: Independent  PATIENT GOALS improve dizziness  OBJECTIVE:   DIAGNOSTIC FINDINGS: none recent  COGNITION: Overall cognitive status: Within functional limits for tasks assessed   SENSATION: WFL   POSTURE: rounded shoulders   FUNCTIONAL TESTs:   Romberg with EC 30 sec: moderate-severe sway  Gait: without device shows intermittent path deviation and use of furniture walking  VESTIBULAR ASSESSMENT   GENERAL OBSERVATION: bruising under L eye; patient is  wearing bifocals for reading- reports that he wears them most of the time    OCULOMOTOR EXAM:   Ocular Alignment: normal   Ocular ROM: No Limitations   Spontaneous Nystagmus: absent   Gaze-Induced Nystagmus: absent   Smooth Pursuits: intact   Saccades: intact   Convergence/Divergence: ~3 cm    VESTIBULAR - OCULAR REFLEX:    Slow VOR: Comment: horizontal: slower to the L; c/o mild dizziness and L eye pain; vertical: slightly discoordinated neck movement but c/o less head symptoms    VOR Cancellation: Unable to Maintain Gaze; limited by dizziness   Head-Impulse Test: HIT Right: slightly positive HIT Left: positive      POSITIONAL TESTING:  Right Roll Test: c/o dizziness during movement which dissipates when stopping movement Left Roll Test: c/o dizziness during movement which dissipates when stopping movement Right Dix-Hallpike: negative; c/o dizziness upon laying down<sitting up Left Dix-Hallpike: negative; c/o dizziness upon laying down<sitting up    VESTIBULAR TREATMENT:  PATIENT EDUCATION: Education details: prognosis, POC, HEP, edu on how vestibular decompensation after procedure/hospitalization  Person educated: Patient Education method: Explanation,  Demonstration, Tactile cues, Verbal cues, and Handouts Education comprehension: verbalized understanding and returned demonstration   GOALS: Goals reviewed with patient? Yes  SHORT TERM GOALS: Target date: 02/23/2022  Patient to be independent with initial HEP. Baseline: HEP initiated Goal status: INITIAL    LONG TERM GOALS: Target date: 03/16/2022  Patient to be independent with advanced HEP. Baseline: Not yet initiated  Goal status: INITIAL  Patient to report 0/10 dizziness with standing vertical and horizontal VOR for 30 seconds. Baseline: c/o dizziness Goal status: INITIAL  Patient will report 0/10 dizziness with bed mobility.  Baseline: Symptomatic  Goal status: INITIAL  Patient to demonstrate  mild-moderate sway with M-CTSIB condition with eyes closed/foam surface in order to improve safety in environments with uneven surfaces and dim lighting. Baseline: unable Goal status: INITIAL  Patient to score at least 20/24 on DGI in order to decrease risk of falls. Baseline: NT Goal status: INITIAL  Patient to report tolerance for 1-2 hour car ride without dizziness limiting.  Baseline: Unable Goal status: INITIAL    ASSESSMENT:  CLINICAL IMPRESSION:   Patient is a 71 y/o M presenting to OPPT with acute on chronic dizziness s/p L cochlear implant surgery on 01/11/22. Of note, patient with acute bout of dizziness on 09/02/21 and was treated in OPPT for L vestibular hypofunction; D/[facility]'d from [facility] 11/01/21. Current bout of dizziness is described as "disorienting" and lasts seconds-minutes with L head turns and riding in the car. Patient today presenting with dizziness with VOR horizontal>vertical, dizziness and difficulty maintaining gaze with VOR cancellation, positive L and slightly positive R HIT. Positional testing was positive for motion sensitivity. Patient also demonstrates imbalance and gait deviations. Patient was educated on gentle VOR and balance HEP and reported understanding. Would benefit from skilled PT services 1-2x/week for 6 weeks to address aforementioned impairments in order to optimize level of function.     OBJECTIVE IMPAIRMENTS Abnormal gait, decreased balance, difficulty walking, and dizziness.   ACTIVITY LIMITATIONS carrying, lifting, bending, standing, squatting, sleeping, stairs, transfers, bed mobility, bathing, dressing, and reach over head  PARTICIPATION LIMITATIONS: meal prep, cleaning, laundry, driving, shopping, community activity, occupation, yard work, and church  PERSONAL FACTORS Age, Fitness, Past/current experiences, Time since onset of injury/illness/exacerbation, and 3+ comorbidities: L cochlear implant, clotting disorder, R hearing loss, HTN  are  also affecting patient's functional outcome.   REHAB POTENTIAL: Good  CLINICAL DECISION MAKING: Evolving/moderate complexity  EVALUATION COMPLEXITY: Moderate   PLAN: PT FREQUENCY: 1-2x/week  PT DURATION: 6 weeks  PLANNED INTERVENTIONS: Therapeutic exercises, Therapeutic activity, Neuromuscular re-education, Balance training, Gait training, Patient/Family education, Joint mobilization, Stair training, Vestibular training, Canalith repositioning, Dry Needling, Electrical stimulation, Cryotherapy, Moist heat, Taping, Manual therapy, and Re-evaluation  PLAN FOR NEXT SESSION: reassess HEP, assess DGI, progress VOR and habituation activities   Janene Harvey, PT, DPT 02/02/22 4:54 PM

## 2022-02-02 ENCOUNTER — Ambulatory Visit: Payer: Medicare Other | Attending: Internal Medicine | Admitting: Physical Therapy

## 2022-02-02 ENCOUNTER — Encounter: Payer: Self-pay | Admitting: Physical Therapy

## 2022-02-02 DIAGNOSIS — R2689 Other abnormalities of gait and mobility: Secondary | ICD-10-CM | POA: Insufficient documentation

## 2022-02-02 DIAGNOSIS — R2681 Unsteadiness on feet: Secondary | ICD-10-CM | POA: Diagnosis present

## 2022-02-02 DIAGNOSIS — R42 Dizziness and giddiness: Secondary | ICD-10-CM | POA: Insufficient documentation

## 2022-02-07 ENCOUNTER — Ambulatory Visit: Payer: Medicare Other | Attending: Internal Medicine

## 2022-02-07 DIAGNOSIS — R2689 Other abnormalities of gait and mobility: Secondary | ICD-10-CM | POA: Insufficient documentation

## 2022-02-07 DIAGNOSIS — R2681 Unsteadiness on feet: Secondary | ICD-10-CM | POA: Insufficient documentation

## 2022-02-07 DIAGNOSIS — R42 Dizziness and giddiness: Secondary | ICD-10-CM | POA: Diagnosis present

## 2022-02-07 NOTE — Therapy (Signed)
OUTPATIENT PHYSICAL THERAPY TREATMENT NOTE   Patient Name: Don Ruiz MRN: [id number] DOB:06/12/1951, 71 y.o., male Today's Date: 02/07/2022  PCP: Lavone Orn, MD REFERRING PROVIDER: N/A  END OF SESSION:   PT End of Session - 02/07/22 0854     Visit Number 2    Number of Visits 13    Date for PT Re-Evaluation 03/16/22    Authorization Type [employer] Medicare    Progress Note Due on Visit 10    PT Start Time 0850    PT Stop Time 0930    PT Time Calculation (min) 40 min    Activity Tolerance Patient tolerated treatment well    Behavior During Therapy [facility] for tasks assessed/performed             Past Medical History:  Diagnosis Date   Ankle impingement syndrome    Right ankle from bone spur   Antiphospholipid antibody syndrome ([place]) 06/12/2012   Coagulopathy ([place]) 06/12/2012   DVT (deep venous thrombosis) ([place]) 1990, 2002   chronic hypercoagulobility.    DVT, recurrent, lower extremity, chronic 06/12/2012   1992   Dysrhythmia    AF   GERD (gastroesophageal reflux disease)    H/O tinnitus    Heterozygous factor V Leiden mutation ([place]) 06/12/2012   Hyperlipidemia    Migraine headache    Protein S deficiency ([place]) 06/12/2012   Prothrombin gene mutation ([place]) 06/12/2012   Pulmonary embolism ([place])    Pulmonary embolism, bilateral ([place]) 06/12/2012   July, 2002   PVC (premature ventricular contraction)    Varicose veins 06/12/2012   RLE   Wears hearing aid    Past Surgical History:  Procedure Laterality Date   ABDOMINAL AORTIC ANEURYSM REPAIR  12/14   [facility]   ANKLE SURGERY  2005   right   APPENDECTOMY     ARTERY BIOPSY Left 08/12/2014   Procedure: LEFT TEMPORAL ARTERY BIOPSY ;  Surgeon: Coralie Keens, MD;  Location: [facility];  Service: General;  Laterality: Left;   CHOLECYSTECTOMY  2008   lap choli   COLONOSCOPY N/A 09/03/2013   Procedure: COLONOSCOPY;  Surgeon: Garlan Fair, MD;  Location: [facility];  Service: Endoscopy;  Laterality: N/A;    ESOPHAGOGASTRODUODENOSCOPY N/A 01/05/2022   Procedure: ESOPHAGOGASTRODUODENOSCOPY (EGD);  Surgeon: Arta Silence, MD;  Location: [facility];  Service: Gastroenterology;  Laterality: N/A;   FINGER SURGERY     Right 1st finger,  by Dr. Kalman Shan TENDON REPAIR  2013   left 5th finger by Dr. Caralyn Guile   INCISION AND DRAINAGE HIP Left 02/17/2021   Procedure: IRRIGATION AND DEBRIDEMENT LEFT THIGH;  Surgeon: Dorna Leitz, MD;  Location: [facility];  Service: Orthopedics;  Laterality: Left;   PATELLA RELEASE AND MANIPULATION  1992   Right [place]   UPPER ESOPHAGEAL ENDOSCOPIC ULTRASOUND (EUS) Bilateral 01/05/2022   Procedure: UPPER ESOPHAGEAL ENDOSCOPIC ULTRASOUND (EUS);  Surgeon: Arta Silence, MD;  Location: [facility];  Service: Gastroenterology;  Laterality: Bilateral;   Patient Active Problem List   Diagnosis Date Noted   Vertigo 08/28/2021   PAF (paroxysmal atrial fibrillation) ([place]) 08/28/2021   Hematoma of left thigh 02/17/2021   Elevated LFTs    Acute epigastric pain 02/04/2017   HTN (hypertension) 09/27/2013   SNHL (sensorineural hearing loss) 05/24/2013   CAD (coronary artery disease) 04/18/2013   Chronic anticoagulation 04/18/2013   GERD (gastroesophageal reflux disease) 04/18/2013   History of pulmonary embolism 04/18/2013   Blood clotting disorder ([place]) 06/12/2012   Heterozygous factor V Leiden  mutation ([place]) 06/12/2012   Prothrombin gene mutation ([place]) 06/12/2012   Antiphospholipid antibody syndrome ([place]) 06/12/2012   Protein S deficiency ([place]) 06/12/2012   DVT, recurrent, lower extremity, chronic 06/12/2012   Pulmonary embolism, bilateral ([place]) 06/12/2012   Varicose veins 06/12/2012   Chronic venous embolism and thrombosis of deep vessels of lower extremity ([place]) 06/12/2012   Primary hypercoagulable state ([place]) 06/12/2012   Aneurysm of abdominal vessel ([place]) 03/12/2012   Dissection of aorta, abdominal ([place]) 03/12/2012   ABNORMALITY OF GAIT 07/02/2009   SPRAIN&STRAIN  OTHER SPECIFIED SITES KNEE&LEG 07/02/2009    REFERRING DIAG:  Z86.718 (ICD-10-CM) - Personal history of other venous thrombosis and embolism  R42 (ICD-10-CM) - Dizziness and giddiness    THERAPY DIAG:  Dizziness and giddiness  Unsteadiness on feet  Other abnormalities of gait and mobility  Rationale for Evaluation and Treatment Rehabilitation  PERTINENT HISTORY:  L cochlear implant, clotting disorder, R hearing loss, HTN  PRECAUTIONS: Other: HOH, hears better in R ear  SUBJECTIVE: working on exercises over the weekend. Plan on trying elliptical later today  PAIN:  Are you having pain? No   OOBJECTIVE:    DIAGNOSTIC FINDINGS: none recent   COGNITION: Overall cognitive status: Within functional limits for tasks assessed             SENSATION: WFL     POSTURE: rounded shoulders     FUNCTIONAL TESTs:    Romberg with EC 30 sec: moderate-severe sway   Gait: without device shows intermittent path deviation and use of furniture walking   VESTIBULAR ASSESSMENT              GENERAL OBSERVATION: bruising under L eye; patient is wearing bifocals for reading- reports that he wears them most of the time                OCULOMOTOR EXAM:                       Ocular Alignment: normal                       Ocular ROM: No Limitations                       Spontaneous Nystagmus: absent                       Gaze-Induced Nystagmus: absent                       Smooth Pursuits: intact                       Saccades: intact                       Convergence/Divergence: ~3 cm               VESTIBULAR - OCULAR REFLEX:                        Slow VOR: Comment: horizontal: slower to the L; c/o mild dizziness and L eye pain; vertical: slightly discoordinated neck movement but c/o less head symptoms                        VOR Cancellation: Unable to Maintain Gaze; limited by dizziness  Head-Impulse Test: HIT Right: slightly positive HIT Left: positive                                     POSITIONAL TESTING:  Right Roll Test: c/o dizziness during movement which dissipates when stopping movement Left Roll Test: c/o dizziness during movement which dissipates when stopping movement Right Dix-Hallpike: negative; c/o dizziness upon laying down<sitting up Left Dix-Hallpike: negative; c/o dizziness upon laying down<sitting up   Dynamic Gait Index    1. Gait level surface __3___ (3) Normal: Walks 20', no assistive devices, good sped, no evidence for imbalance, normal gait pattern (2) Mild Impairment: Walks 20', uses assistive devices, slower speed, mild gait deviations. (1) Moderate Impairment: Walks 20', slow speed, abnormal gait pattern, evidence for imbalance. (0) Severe Impairment: Cannot walk 20' without assistance, severe gait deviations or imbalance.  2. Change in gait speed __2___ (3) Normal: Able to smoothly change walking speed without loss of balance or gait deviation. Shows a  significant difference in walking speeds between normal, fast and slow speeds. (2) Mild Impairment: Is able to change speed but demonstrates mild gait deviations, or not gait  deviations but unable to achieve a significant change in velocity, or uses an assistive device. (1) Moderate Impairment: Makes only minor adjustments to walking speed, or accomplishes a change  in speed with significant gait deviations, or changes speed but has significant gait deviations, or  changes speed but loses balance but is able to recover and continue walking. (0) Severe Impairment: Cannot change speeds, or loses balance and has to reach for wall or be caught.  3. Gait with horizontal head turns __2___ (3) Normal: Performs head turns smoothly with no change in gait. (2) Mild Impairment: Performs head turns smoothly with slight change in gait velocity, i.e., minor  disruption to smooth gait path or uses walking aid. (1) Moderate Impairment: Performs head turns with moderate change in  gait velocity, slows down,  staggers but recovers, can continue to walk. (0) Severe Impairment: Performs task with severe disruption of gait, i.e., staggers  outside 15" path, loses balance, stops, reaches for wall.  4. Gait with vertical head turns __3___ (3) Normal: Performs head turns smoothly with no change in gait. (2) Mild Impairment: Performs head turns smoothly with slight change in gait velocity, i.e., minor  disruption to smooth gait path or uses walking aid. (1) Moderate Impairment: Performs head turns with moderate change in gait velocity, slows down,  staggers but recovers, can continue to walk. (0) Severe Impairment: Performs task with severe disruption of gait, i.e., staggers  outside 15" path, loses balance, stops, reaches for wall.  5. Gait and pivot turn __3___ (3) Normal: Pivot turns safely within 3 seconds and stops quickly with no loss of balance. (2) Mild Impairment: Pivot turns safely in > 3 seconds and stops with no loss of balance. (1) Moderate Impairment: Turns slowly, requires verbal cueing, requires several small steps to catch  balance following turn and stop. (0) Severe Impairment: Cannot turn safely, requires assistance to turn and stop.  6. Step over obstacle __3___ (3) Normal: Is able to step over the box without changing gait speed, no evidence of imbalance. (2) Mild Impairment: Is able to step over box, but must slow down and adjust steps to clear box safely. (1) Moderate Impairment: Is able to step over box but must stop, then step over. May require verbal  cueing. (  0) Severe Impairment: Cannot perform without assistance.  7. Step around obstacles __2___ (3) Normal: Is able to walk around cones safely without changing gait speed; no evidence of  imbalance. (2) Mild Impairment: Is able to step around both cones, but must slow down and adjust steps to clear  cones. (1) Moderate Impairment: Is able to clear cones but must significantly slow, speed to  accomplish task,  or requires verbal cueing. (0) Severe Impairment: Unable to clear cones, walks into one or both cones, or requires physical  assistance.  8. Steps __2___ (3) Normal: Alternating feet, no rail. (2) Mild Impairment: Alternating feet, must use rail. (1) Moderate Impairment: Two feet to a stair, must use rail. (0) Severe Impairment: Cannot do safely.  TOTAL SCORE: __20___/ 24  Interpretation:  < 19/24 = predictive of falls in the elderly > 22/24 = safe ambulators      VESTIBULAR TREATMENT:   TODAY'S TREATMENT: 02/07/22 Activity Comments  Brandt-Daroff 5x Partial range  Romberg: firm eyes open/closed; foam eyes open/closed Near-immediate left LOB on foam eyes closed  Dynamic Gait Index 20/24  Quarter-turns 10x left/right, 5x pantomime golf swing; half-turns 5x left/right   VOR cancellation 2x30 sec RUE/LUE          PATIENT EDUCATION: Education details: prognosis, POC, HEP, edu on how vestibular decompensation after procedure/hospitalization  Person educated: Patient Education method: Explanation, Demonstration, Tactile cues, Verbal cues, and Handouts Education comprehension: verbalized understanding and returned demonstration     GOALS: Goals reviewed with patient? Yes   SHORT TERM GOALS: Target date: 02/23/2022   Patient to be independent with initial HEP. Baseline: HEP initiated Goal status: INITIAL       LONG TERM GOALS: Target date: 03/16/2022   Patient to be independent with advanced HEP. Baseline: Not yet initiated  Goal status: INITIAL   Patient to report 0/10 dizziness with standing vertical and horizontal VOR for 30 seconds. Baseline: c/o dizziness Goal status: INITIAL   Patient will report 0/10 dizziness with bed mobility.  Baseline: Symptomatic  Goal status: INITIAL   Patient to demonstrate mild-moderate sway with M-CTSIB condition with eyes closed/foam surface in order to improve safety in environments with uneven surfaces and dim  lighting. Baseline: unable Goal status: INITIAL   Patient to score at least 20/24 on DGI in order to decrease risk of falls. Baseline: NT Goal status: INITIAL   Patient to report tolerance for 1-2 hour car ride without dizziness limiting.  Baseline: Unable Goal status: INITIAL       ASSESSMENT:   CLINICAL IMPRESSION:     Continues to demonstrate LOB left > right with challenge to vestibular system with unsteadiness present during walking with head turns pathway deviation > 15 inches and postural instability with turns to the left > right.  Complete LOB when standing foam/eyes closed-falls left. Demonstrating excellent HEP recall for habituation activities and progressed quarter-turns to half-turns. Continued sessions to progress vestibular rehabilitation     OBJECTIVE IMPAIRMENTS Abnormal gait, decreased balance, difficulty walking, and dizziness.    ACTIVITY LIMITATIONS carrying, lifting, bending, standing, squatting, sleeping, stairs, transfers, bed mobility, bathing, dressing, and reach over head   PARTICIPATION LIMITATIONS: meal prep, cleaning, laundry, driving, shopping, community activity, occupation, yard work, and church   PERSONAL FACTORS Age, Fitness, Past/current experiences, Time since onset of injury/illness/exacerbation, and 3+ comorbidities: L cochlear implant, clotting disorder, R hearing loss, HTN  are also affecting patient's functional outcome.    REHAB POTENTIAL: Good   CLINICAL DECISION MAKING: Evolving/moderate complexity  EVALUATION COMPLEXITY: Moderate     PLAN: PT FREQUENCY: 1-2x/week   PT DURATION: 6 weeks   PLANNED INTERVENTIONS: Therapeutic exercises, Therapeutic activity, Neuromuscular re-education, Balance training, Gait training, Patient/Family education, Joint mobilization, Stair training, Vestibular training, Canalith repositioning, Dry Needling, Electrical stimulation, Cryotherapy, Moist heat, Taping, Manual therapy, and Re-evaluation    PLAN FOR NEXT SESSION: reassess HEP, progress VOR and habituation activities  10:28 AM, 02/07/22 M. Sherlyn Lees, PT, DPT Physical Therapist- [place] Office Number: [phone]

## 2022-02-14 ENCOUNTER — Ambulatory Visit: Payer: Medicare Other | Admitting: Physical Therapy

## 2022-02-15 ENCOUNTER — Ambulatory Visit: Payer: Medicare Other

## 2022-02-15 DIAGNOSIS — R42 Dizziness and giddiness: Secondary | ICD-10-CM | POA: Diagnosis not present

## 2022-02-15 DIAGNOSIS — R2681 Unsteadiness on feet: Secondary | ICD-10-CM

## 2022-02-15 DIAGNOSIS — R2689 Other abnormalities of gait and mobility: Secondary | ICD-10-CM

## 2022-02-15 NOTE — Therapy (Signed)
OUTPATIENT PHYSICAL THERAPY TREATMENT NOTE   Patient Name: Don Ruiz MRN: [id number] DOB:Jun 07, 1951, 71 y.o., male Today's Date: 02/15/2022  PCP: Lavone Orn, MD REFERRING PROVIDER: N/A  END OF SESSION:   PT End of Session - 02/15/22 0931     Visit Number 3    Number of Visits 13    Date for PT Re-Evaluation 03/16/22    Authorization Type [employer] Medicare    Progress Note Due on Visit 10    PT Start Time 0930    PT Stop Time 1015    PT Time Calculation (min) 45 min    Activity Tolerance Patient tolerated treatment well    Behavior During Therapy [facility] for tasks assessed/performed             Past Medical History:  Diagnosis Date   Ankle impingement syndrome    Right ankle from bone spur   Antiphospholipid antibody syndrome ([place]) 06/12/2012   Coagulopathy ([place]) 06/12/2012   DVT (deep venous thrombosis) ([place]) 1990, 2002   chronic hypercoagulobility.    DVT, recurrent, lower extremity, chronic 06/12/2012   1992   Dysrhythmia    AF   GERD (gastroesophageal reflux disease)    H/O tinnitus    Heterozygous factor V Leiden mutation ([place]) 06/12/2012   Hyperlipidemia    Migraine headache    Protein S deficiency ([place]) 06/12/2012   Prothrombin gene mutation ([place]) 06/12/2012   Pulmonary embolism ([place])    Pulmonary embolism, bilateral ([place]) 06/12/2012   July, 2002   PVC (premature ventricular contraction)    Varicose veins 06/12/2012   RLE   Wears hearing aid    Past Surgical History:  Procedure Laterality Date   ABDOMINAL AORTIC ANEURYSM REPAIR  12/14   [facility]   ANKLE SURGERY  2005   right   APPENDECTOMY     ARTERY BIOPSY Left 08/12/2014   Procedure: LEFT TEMPORAL ARTERY BIOPSY ;  Surgeon: Coralie Keens, MD;  Location: [place];  Service: General;  Laterality: Left;   CHOLECYSTECTOMY  2008   lap choli   COLONOSCOPY N/A 09/03/2013   Procedure: COLONOSCOPY;  Surgeon: Garlan Fair, MD;  Location: [facility];  Service: Endoscopy;  Laterality: N/A;    ESOPHAGOGASTRODUODENOSCOPY N/A 01/05/2022   Procedure: ESOPHAGOGASTRODUODENOSCOPY (EGD);  Surgeon: Arta Silence, MD;  Location: [facility];  Service: Gastroenterology;  Laterality: N/A;   FINGER SURGERY     Right 1st finger,  by Dr. Kalman Shan TENDON REPAIR  2013   left 5th finger by Dr. Caralyn Guile   INCISION AND DRAINAGE HIP Left 02/17/2021   Procedure: IRRIGATION AND DEBRIDEMENT LEFT THIGH;  Surgeon: Dorna Leitz, MD;  Location: [facility];  Service: Orthopedics;  Laterality: Left;   PATELLA RELEASE AND MANIPULATION  1992   Right [place]   UPPER ESOPHAGEAL ENDOSCOPIC ULTRASOUND (EUS) Bilateral 01/05/2022   Procedure: UPPER ESOPHAGEAL ENDOSCOPIC ULTRASOUND (EUS);  Surgeon: Arta Silence, MD;  Location: [facility];  Service: Gastroenterology;  Laterality: Bilateral;   Patient Active Problem List   Diagnosis Date Noted   Vertigo 08/28/2021   PAF (paroxysmal atrial fibrillation) ([place]) 08/28/2021   Hematoma of left thigh 02/17/2021   Elevated LFTs    Acute epigastric pain 02/04/2017   HTN (hypertension) 09/27/2013   SNHL (sensorineural hearing loss) 05/24/2013   CAD (coronary artery disease) 04/18/2013   Chronic anticoagulation 04/18/2013   GERD (gastroesophageal reflux disease) 04/18/2013   History of pulmonary embolism 04/18/2013   Blood clotting disorder ([place]) 06/12/2012   Heterozygous factor V Leiden  mutation ([place]) 06/12/2012   Prothrombin gene mutation ([place]) 06/12/2012   Antiphospholipid antibody syndrome ([place]) 06/12/2012   Protein S deficiency ([place]) 06/12/2012   DVT, recurrent, lower extremity, chronic 06/12/2012   Pulmonary embolism, bilateral ([place]) 06/12/2012   Varicose veins 06/12/2012   Chronic venous embolism and thrombosis of deep vessels of lower extremity ([place]) 06/12/2012   Primary hypercoagulable state ([place]) 06/12/2012   Aneurysm of abdominal vessel ([place]) 03/12/2012   Dissection of aorta, abdominal ([place]) 03/12/2012   ABNORMALITY OF GAIT 07/02/2009   SPRAIN&STRAIN  OTHER SPECIFIED SITES KNEE&LEG 07/02/2009    REFERRING DIAG:  Z86.718 (ICD-10-CM) - Personal history of other venous thrombosis and embolism  R42 (ICD-10-CM) - Dizziness and giddiness    THERAPY DIAG:  Dizziness and giddiness  Unsteadiness on feet  Other abnormalities of gait and mobility  Rationale for Evaluation and Treatment Rehabilitation  PERTINENT HISTORY:  L cochlear implant, clotting disorder, R hearing loss, HTN  PRECAUTIONS: Other: HOH, hears better in R ear  SUBJECTIVE: Don't see the improvements day-to-day, but weekly improvement. Quick turns to the left provoke symptoms. World-moving sensation. Exercises 2x/day  PAIN:  Are you having pain? No   OOBJECTIVE:   VESTIBULAR TREATMENT:  TODAY'S TREATMENT: 02/15/22 Activity Comments  VOR x 1 horizontal/vertical  30 sec  Golf-swing chop w/ 3# to target 1x10  VOR cancellation with red ball/arms extended 2x10 1x10 firm, 1x10 foam  Eyes closed on foam 3x30 sec Left LOB  Eyes closed standing plantarflexion 2x10   VOR x 1 horizontal bouncing on stability ball 30 sec, provoking  180 deg turns to find playing card 14 cards requiring full turns left/right  Brandt-Daroff 5x Ruiz symptom provocation falling the the left       PATIENT EDUCATION: Education details: prognosis, POC, HEP, edu on how vestibular decompensation after procedure/hospitalization  Person educated: Patient Education method: Explanation, Demonstration, Tactile cues, Verbal cues, and Handouts Education comprehension: verbalized understanding and returned demonstration   DIAGNOSTIC FINDINGS: none recent   COGNITION: Overall cognitive status: Within functional limits for tasks assessed             SENSATION: WFL     POSTURE: rounded shoulders     FUNCTIONAL TESTs:    Romberg with EC 30 sec: moderate-severe sway   Gait: without device shows intermittent path deviation and use of furniture walking   VESTIBULAR ASSESSMENT               GENERAL OBSERVATION: bruising under L eye; patient is wearing bifocals for reading- reports that he wears them most of the time                OCULOMOTOR EXAM:                       Ocular Alignment: normal                       Ocular ROM: No Limitations                       Spontaneous Nystagmus: absent                       Gaze-Induced Nystagmus: absent                       Smooth Pursuits: intact  Saccades: intact                       Convergence/Divergence: ~3 cm               VESTIBULAR - OCULAR REFLEX:                        Slow VOR: Comment: horizontal: slower to the L; c/o mild dizziness and L eye pain; vertical: slightly discoordinated neck movement but c/o less head symptoms                        VOR Cancellation: Unable to Maintain Gaze; limited by dizziness                       Head-Impulse Test: HIT Right: slightly positive HIT Left: positive                                    POSITIONAL TESTING:  Right Roll Test: c/o dizziness during movement which dissipates when stopping movement Left Roll Test: c/o dizziness during movement which dissipates when stopping movement Right Dix-Hallpike: negative; c/o dizziness upon laying down<sitting up Left Dix-Hallpike: negative; c/o dizziness upon laying down<sitting up   Dynamic Gait Index    1. Gait level surface __3___ (3) Normal: Walks 20', no assistive devices, good sped, no evidence for imbalance, normal gait pattern (2) Mild Impairment: Walks 20', uses assistive devices, slower speed, mild gait deviations. (1) Moderate Impairment: Walks 20', slow speed, abnormal gait pattern, evidence for imbalance. (0) Severe Impairment: Cannot walk 20' without assistance, severe gait deviations or imbalance.  2. Change in gait speed __2___ (3) Normal: Able to smoothly change walking speed without loss of balance or gait deviation. Shows a  significant difference in walking speeds between normal, fast and slow  speeds. (2) Mild Impairment: Is able to change speed but demonstrates mild gait deviations, or not gait  deviations but unable to achieve a significant change in velocity, or uses an assistive device. (1) Moderate Impairment: Makes only minor adjustments to walking speed, or accomplishes a change  in speed with significant gait deviations, or changes speed but has significant gait deviations, or  changes speed but loses balance but is able to recover and continue walking. (0) Severe Impairment: Cannot change speeds, or loses balance and has to reach for wall or be caught.  3. Gait with horizontal head turns __2___ (3) Normal: Performs head turns smoothly with no change in gait. (2) Mild Impairment: Performs head turns smoothly with slight change in gait velocity, i.e., minor  disruption to smooth gait path or uses walking aid. (1) Moderate Impairment: Performs head turns with moderate change in gait velocity, slows down,  staggers but recovers, can continue to walk. (0) Severe Impairment: Performs task with severe disruption of gait, i.e., staggers  outside 15" path, loses balance, stops, reaches for wall.  4. Gait with vertical head turns __3___ (3) Normal: Performs head turns smoothly with no change in gait. (2) Mild Impairment: Performs head turns smoothly with slight change in gait velocity, i.e., minor  disruption to smooth gait path or uses walking aid. (1) Moderate Impairment: Performs head turns with moderate change in gait velocity, slows down,  staggers but recovers, can continue to walk. (0) Severe Impairment: Performs task with severe disruption of gait, i.e.,  staggers  outside 15" path, loses balance, stops, reaches for wall.  5. Gait and pivot turn __3___ (3) Normal: Pivot turns safely within 3 seconds and stops quickly with no loss of balance. (2) Mild Impairment: Pivot turns safely in > 3 seconds and stops with no loss of balance. (1) Moderate Impairment: Turns slowly,  requires verbal cueing, requires several small steps to catch  balance following turn and stop. (0) Severe Impairment: Cannot turn safely, requires assistance to turn and stop.  6. Step over obstacle __3___ (3) Normal: Is able to step over the box without changing gait speed, no evidence of imbalance. (2) Mild Impairment: Is able to step over box, but must slow down and adjust steps to clear box safely. (1) Moderate Impairment: Is able to step over box but must stop, then step over. May require verbal  cueing. (0) Severe Impairment: Cannot perform without assistance.  7. Step around obstacles __2___ (3) Normal: Is able to walk around cones safely without changing gait speed; no evidence of  imbalance. (2) Mild Impairment: Is able to step around both cones, but must slow down and adjust steps to clear  cones. (1) Moderate Impairment: Is able to clear cones but must significantly slow, speed to accomplish task,  or requires verbal cueing. (0) Severe Impairment: Unable to clear cones, walks into one or both cones, or requires physical  assistance.  8. Steps __2___ (3) Normal: Alternating feet, no rail. (2) Mild Impairment: Alternating feet, must use rail. (1) Moderate Impairment: Two feet to a stair, must use rail. (0) Severe Impairment: Cannot do safely.  TOTAL SCORE: __20___/ 24  Interpretation:  < 19/24 = predictive of falls in the elderly > 22/24 = safe ambulators           GOALS: Goals reviewed with patient? Yes   SHORT TERM GOALS: Target date: 02/23/2022   Patient to be independent with initial HEP. Baseline: HEP initiated Goal status: INITIAL       LONG TERM GOALS: Target date: 03/16/2022   Patient to be independent with advanced HEP. Baseline: Not yet initiated  Goal status: INITIAL   Patient to report 0/10 dizziness with standing vertical and horizontal VOR for 30 seconds. Baseline: c/o dizziness Goal status: INITIAL   Patient will report 0/10 dizziness  with bed mobility.  Baseline: Symptomatic  Goal status: INITIAL   Patient to demonstrate mild-moderate sway with M-CTSIB condition with eyes closed/foam surface in order to improve safety in environments with uneven surfaces and dim lighting. Baseline: unable Goal status: INITIAL   Patient to score at least 20/24 on DGI in order to decrease risk of falls. Baseline: NT Goal status: INITIAL   Patient to report tolerance for 1-2 hour car ride without dizziness limiting.  Baseline: Unable Goal status: INITIAL       ASSESSMENT:   CLINICAL IMPRESSION:   Improving in his stance stability with eyes closed albeit via compensatory strategies but notes and reports some improvement in his turning/head rotation to the left. Cues needed for full range Brandt-Daroff positioning with cues for position and notes ill-effect when transitioning from sitting to left sidelying and returning to sitting.  Notes overall improvement as he has been able to go on daily walks now without walking poles as a requirement. Continued sessions to progress to VOR x 2 and provide further habituation activities     OBJECTIVE IMPAIRMENTS Abnormal gait, decreased balance, difficulty walking, and dizziness.    ACTIVITY LIMITATIONS carrying, lifting, bending, standing, squatting, sleeping, stairs,  transfers, bed mobility, bathing, dressing, and reach over head   PARTICIPATION LIMITATIONS: meal prep, cleaning, laundry, driving, shopping, community activity, occupation, yard work, and church   PERSONAL FACTORS Age, Fitness, Past/current experiences, Time since onset of injury/illness/exacerbation, and 3+ comorbidities: L cochlear implant, clotting disorder, R hearing loss, HTN  are also affecting patient's functional outcome.    REHAB POTENTIAL: Good   CLINICAL DECISION MAKING: Evolving/moderate complexity   EVALUATION COMPLEXITY: Moderate     PLAN: PT FREQUENCY: 1-2x/week   PT DURATION: 6 weeks   PLANNED  INTERVENTIONS: Therapeutic exercises, Therapeutic activity, Neuromuscular re-education, Balance training, Gait training, Patient/Family education, Joint mobilization, Stair training, Vestibular training, Canalith repositioning, Dry Needling, Electrical stimulation, Cryotherapy, Moist heat, Taping, Manual therapy, and Re-evaluation   PLAN FOR NEXT SESSION: reassess HEP, progress VOR and habituation activities  9:31 AM, 02/15/22 M. Sherlyn Lees, PT, DPT Physical Therapist- [place] Office Number: [phone]

## 2022-02-18 ENCOUNTER — Ambulatory Visit: Payer: Medicare Other | Admitting: Physical Therapy

## 2022-02-21 ENCOUNTER — Ambulatory Visit: Payer: Medicare Other

## 2022-02-24 ENCOUNTER — Ambulatory Visit: Payer: Medicare Other

## 2022-02-28 ENCOUNTER — Ambulatory Visit: Payer: Medicare Other

## 2022-02-28 DIAGNOSIS — R2689 Other abnormalities of gait and mobility: Secondary | ICD-10-CM

## 2022-02-28 DIAGNOSIS — R42 Dizziness and giddiness: Secondary | ICD-10-CM

## 2022-02-28 DIAGNOSIS — R2681 Unsteadiness on feet: Secondary | ICD-10-CM

## 2022-03-07 ENCOUNTER — Ambulatory Visit: Payer: Medicare Other

## 2022-03-07 ENCOUNTER — Ambulatory Visit: Payer: Medicare Other | Attending: Internal Medicine

## 2022-03-07 DIAGNOSIS — R2689 Other abnormalities of gait and mobility: Secondary | ICD-10-CM | POA: Insufficient documentation

## 2022-03-07 DIAGNOSIS — R42 Dizziness and giddiness: Secondary | ICD-10-CM | POA: Insufficient documentation

## 2022-03-07 DIAGNOSIS — R2681 Unsteadiness on feet: Secondary | ICD-10-CM | POA: Insufficient documentation

## 2022-03-07 NOTE — Therapy (Signed)
OUTPATIENT PHYSICAL THERAPY TREATMENT NOTE   Patient Name: Don Ruiz MRN: [id number] DOB:[birth year], 71 y.o., male Today's Date: 03/07/2022  PCP: Lavone Orn, MD REFERRING PROVIDER: N/A  END OF SESSION:   PT End of Session - 03/07/22 0852     Visit Number 5    Number of Visits 13    Date for PT Re-Evaluation 03/16/22    Authorization Type [employer] Medicare    Progress Note Due on Visit 10    PT Start Time 0845    PT Stop Time 0930    PT Time Calculation (min) 45 min    Activity Tolerance Patient tolerated treatment well    Behavior During Therapy [facility] for tasks assessed/performed             Past Medical History:  Diagnosis Date   Ankle impingement syndrome    Right ankle from bone spur   Antiphospholipid antibody syndrome ([place]) 06/12/2012   Coagulopathy ([place]) 06/12/2012   DVT (deep venous thrombosis) ([place]) 1990, 2002   chronic hypercoagulobility.    DVT, recurrent, lower extremity, chronic 06/12/2012   1992   Dysrhythmia    AF   GERD (gastroesophageal reflux disease)    H/O tinnitus    Heterozygous factor V Leiden mutation ([place]) 06/12/2012   Hyperlipidemia    Migraine headache    Protein S deficiency ([place]) 06/12/2012   Prothrombin gene mutation ([place]) 06/12/2012   Pulmonary embolism ([place])    Pulmonary embolism, bilateral ([place]) 06/12/2012   July, 2002   PVC (premature ventricular contraction)    Varicose veins 06/12/2012   RLE   Wears hearing aid    Past Surgical History:  Procedure Laterality Date   ABDOMINAL AORTIC ANEURYSM REPAIR  12/14   [facility]   ANKLE SURGERY  2005   right   APPENDECTOMY     ARTERY BIOPSY Left 08/12/2014   Procedure: LEFT TEMPORAL ARTERY BIOPSY ;  Surgeon: Coralie Keens, MD;  Location: [facility];  Service: General;  Laterality: Left;   CHOLECYSTECTOMY  2008   lap choli   COLONOSCOPY N/A 09/03/2013   Procedure: COLONOSCOPY;  Surgeon: Garlan Fair, MD;  Location: [facility];  Service: Endoscopy;  Laterality: N/A;    ESOPHAGOGASTRODUODENOSCOPY N/A 01/05/2022   Procedure: ESOPHAGOGASTRODUODENOSCOPY (EGD);  Surgeon: Arta Silence, MD;  Location: [facility];  Service: Gastroenterology;  Laterality: N/A;   FINGER SURGERY     Right 1st finger,  by Dr. Kalman Shan TENDON REPAIR  2013   left 5th finger by Dr. Caralyn Guile   INCISION AND DRAINAGE HIP Left 02/17/2021   Procedure: IRRIGATION AND DEBRIDEMENT LEFT THIGH;  Surgeon: Dorna Leitz, MD;  Location: [facility];  Service: Orthopedics;  Laterality: Left;   PATELLA RELEASE AND MANIPULATION  1992   Right [place]   UPPER ESOPHAGEAL ENDOSCOPIC ULTRASOUND (EUS) Bilateral 01/05/2022   Procedure: UPPER ESOPHAGEAL ENDOSCOPIC ULTRASOUND (EUS);  Surgeon: Arta Silence, MD;  Location: [facility];  Service: Gastroenterology;  Laterality: Bilateral;   Patient Active Problem List   Diagnosis Date Noted   Vertigo 08/28/2021   PAF (paroxysmal atrial fibrillation) ([place]) 08/28/2021   Hematoma of left thigh 02/17/2021   Elevated LFTs    Acute epigastric pain 02/04/2017   HTN (hypertension) 09/27/2013   SNHL (sensorineural hearing loss) 05/24/2013   CAD (coronary artery disease) 04/18/2013   Chronic anticoagulation 04/18/2013   GERD (gastroesophageal reflux disease) 04/18/2013   History of pulmonary embolism 04/18/2013   Blood clotting disorder ([place]) 06/12/2012   Heterozygous factor V Leiden  mutation ([place]) 06/12/2012   Prothrombin gene mutation ([place]) 06/12/2012   Antiphospholipid antibody syndrome ([place]) 06/12/2012   Protein S deficiency ([place]) 06/12/2012   DVT, recurrent, lower extremity, chronic 06/12/2012   Pulmonary embolism, bilateral ([place]) 06/12/2012   Varicose veins 06/12/2012   Chronic venous embolism and thrombosis of deep vessels of lower extremity ([place]) 06/12/2012   Primary hypercoagulable state ([place]) 06/12/2012   Aneurysm of abdominal vessel ([place]) 03/12/2012   Dissection of aorta, abdominal ([place]) 03/12/2012   ABNORMALITY OF GAIT 07/02/2009   SPRAIN&STRAIN  OTHER SPECIFIED SITES KNEE&LEG 07/02/2009    REFERRING DIAG:  Z86.718 (ICD-10-CM) - Personal history of other venous thrombosis and embolism  R42 (ICD-10-CM) - Dizziness and giddiness    THERAPY DIAG:  Dizziness and giddiness  Unsteadiness on feet  Other abnormalities of gait and mobility  Rationale for Evaluation and Treatment Rehabilitation  PERTINENT HISTORY:  L cochlear implant, clotting disorder, R hearing loss, HTN  PRECAUTIONS: Other: HOH, hears better in R ear  SUBJECTIVE: Slow improvement. Physically active over the weekend. Exercise on the elliptical. Playing golf on Thursday.   PAIN:  Are you having pain? No   OOBJECTIVE:   VESTIBULAR TREATMENT:  TODAY'S TREATMENT: 02/28/22 Activity Comments  Walk with head tilt Minimal deviation  Half kneel PNF chop 3x10 6.6# med ball  VOR cancellation tall kneeling 2x10 6.6#  Standing PNF chops 3x10 6.6# use of door frame for target  Staggered stance: EO/EC 3x15 sec or 1x10 reps One foot on airex, one on rocker board alt. Ant-post, lat-lat. Static and head rotations  Slow march 4x20 ft 10# unilateral carry, alternating  180 deg turns to cone tap           PATIENT EDUCATION: Education details: prognosis, POC, HEP, edu on how vestibular decompensation after procedure/hospitalization  Person educated: Patient Education method: Explanation, Demonstration, Tactile cues, Verbal cues, and Handouts Education comprehension: verbalized understanding and returned demonstration   DIAGNOSTIC FINDINGS: none recent   COGNITION: Overall cognitive status: Within functional limits for tasks assessed             SENSATION: WFL     POSTURE: rounded shoulders     FUNCTIONAL TESTs:    Romberg with EC 30 sec: moderate-severe sway   Gait: without device shows intermittent path deviation and use of furniture walking   VESTIBULAR ASSESSMENT              GENERAL OBSERVATION: bruising under L eye; patient is wearing bifocals for  reading- reports that he wears them most of the time                OCULOMOTOR EXAM:                       Ocular Alignment: normal                       Ocular ROM: No Limitations                       Spontaneous Nystagmus: absent                       Gaze-Induced Nystagmus: absent                       Smooth Pursuits: intact  Saccades: intact                       Convergence/Divergence: ~3 cm               VESTIBULAR - OCULAR REFLEX:                        Slow VOR: Comment: horizontal: slower to the L; c/o mild dizziness and L eye pain; vertical: slightly discoordinated neck movement but c/o less head symptoms                        VOR Cancellation: Unable to Maintain Gaze; limited by dizziness                       Head-Impulse Test: HIT Right: slightly positive HIT Left: positive                                    POSITIONAL TESTING:  Right Roll Test: c/o dizziness during movement which dissipates when stopping movement Left Roll Test: c/o dizziness during movement which dissipates when stopping movement Right Dix-Hallpike: negative; c/o dizziness upon laying down<sitting up Left Dix-Hallpike: negative; c/o dizziness upon laying down<sitting up   Dynamic Gait Index    1. Gait level surface __3___ (3) Normal: Walks 20', no assistive devices, good sped, no evidence for imbalance, normal gait pattern (2) Mild Impairment: Walks 20', uses assistive devices, slower speed, mild gait deviations. (1) Moderate Impairment: Walks 20', slow speed, abnormal gait pattern, evidence for imbalance. (0) Severe Impairment: Cannot walk 20' without assistance, severe gait deviations or imbalance.  2. Change in gait speed __2___ (3) Normal: Able to smoothly change walking speed without loss of balance or gait deviation. Shows a  significant difference in walking speeds between normal, fast and slow speeds. (2) Mild Impairment: Is able to change speed but demonstrates mild  gait deviations, or not gait  deviations but unable to achieve a significant change in velocity, or uses an assistive device. (1) Moderate Impairment: Makes only minor adjustments to walking speed, or accomplishes a change  in speed with significant gait deviations, or changes speed but has significant gait deviations, or  changes speed but loses balance but is able to recover and continue walking. (0) Severe Impairment: Cannot change speeds, or loses balance and has to reach for wall or be caught.  3. Gait with horizontal head turns __2___ (3) Normal: Performs head turns smoothly with no change in gait. (2) Mild Impairment: Performs head turns smoothly with slight change in gait velocity, i.e., minor  disruption to smooth gait path or uses walking aid. (1) Moderate Impairment: Performs head turns with moderate change in gait velocity, slows down,  staggers but recovers, can continue to walk. (0) Severe Impairment: Performs task with severe disruption of gait, i.e., staggers  outside 15" path, loses balance, stops, reaches for wall.  4. Gait with vertical head turns __3___ (3) Normal: Performs head turns smoothly with no change in gait. (2) Mild Impairment: Performs head turns smoothly with slight change in gait velocity, i.e., minor  disruption to smooth gait path or uses walking aid. (1) Moderate Impairment: Performs head turns with moderate change in gait velocity, slows down,  staggers but recovers, can continue to walk. (0) Severe Impairment: Performs task with severe disruption of gait, i.e.,  staggers  outside 15" path, loses balance, stops, reaches for wall.  5. Gait and pivot turn __3___ (3) Normal: Pivot turns safely within 3 seconds and stops quickly with no loss of balance. (2) Mild Impairment: Pivot turns safely in > 3 seconds and stops with no loss of balance. (1) Moderate Impairment: Turns slowly, requires verbal cueing, requires several small steps to catch  balance  following turn and stop. (0) Severe Impairment: Cannot turn safely, requires assistance to turn and stop.  6. Step over obstacle __3___ (3) Normal: Is able to step over the box without changing gait speed, no evidence of imbalance. (2) Mild Impairment: Is able to step over box, but must slow down and adjust steps to clear box safely. (1) Moderate Impairment: Is able to step over box but must stop, then step over. May require verbal  cueing. (0) Severe Impairment: Cannot perform without assistance.  7. Step around obstacles __2___ (3) Normal: Is able to walk around cones safely without changing gait speed; no evidence of  imbalance. (2) Mild Impairment: Is able to step around both cones, but must slow down and adjust steps to clear  cones. (1) Moderate Impairment: Is able to clear cones but must significantly slow, speed to accomplish task,  or requires verbal cueing. (0) Severe Impairment: Unable to clear cones, walks into one or both cones, or requires physical  assistance.  8. Steps __2___ (3) Normal: Alternating feet, no rail. (2) Mild Impairment: Alternating feet, must use rail. (1) Moderate Impairment: Two feet to a stair, must use rail. (0) Severe Impairment: Cannot do safely.  TOTAL SCORE: __20___/ 24  Interpretation:  < 19/24 = predictive of falls in the elderly > 22/24 = safe ambulators           GOALS: Goals reviewed with patient? Yes   SHORT TERM GOALS: Target date: 02/23/2022   Patient to be independent with initial HEP. Baseline: HEP initiated Goal status: INITIAL       LONG TERM GOALS: Target date: 03/16/2022   Patient to be independent with advanced HEP. Baseline: Not yet initiated  Goal status: INITIAL   Patient to report 0/10 dizziness with standing vertical and horizontal VOR for 30 seconds. Baseline: c/o dizziness Goal status: INITIAL   Patient will report 0/10 dizziness with bed mobility.  Baseline: Symptomatic  Goal status: INITIAL    Patient to demonstrate mild-moderate sway with M-CTSIB condition with eyes closed/foam surface in order to improve safety in environments with uneven surfaces and dim lighting. Baseline: unable Goal status: INITIAL   Patient to score at least 20/24 on DGI in order to decrease risk of falls. Baseline: NT Goal status: INITIAL   Patient to report tolerance for 1-2 hour car ride without dizziness limiting.  Baseline: Unable Goal status: INITIAL       ASSESSMENT:   CLINICAL IMPRESSION: Treatment emphasis with repeated turns for habituation today with use of heavy resistance to require overcoming momentum and performing head turns under various conditions to improve safety with environmental navigation.  Demo tendency for left LOB and difficulty in maintaining midline on compliant surfaces, especially with eyes closed condition. Overall progressing with balance and habituation activities. Pt reports he will attempt to play golf this week and has a compensatory strategy developed for this which includes not watching the follow-through of his drive to avoid dizziness/LOB   OBJECTIVE IMPAIRMENTS Abnormal gait, decreased balance, difficulty walking, and dizziness.    ACTIVITY LIMITATIONS carrying, lifting, bending, standing, squatting, sleeping, stairs, transfers, bed  mobility, bathing, dressing, and reach over head   PARTICIPATION LIMITATIONS: meal prep, cleaning, laundry, driving, shopping, community activity, occupation, yard work, and church   PERSONAL FACTORS Age, Fitness, Past/current experiences, Time since onset of injury/illness/exacerbation, and 3+ comorbidities: L cochlear implant, clotting disorder, R hearing loss, HTN  are also affecting patient's functional outcome.    REHAB POTENTIAL: Good   CLINICAL DECISION MAKING: Evolving/moderate complexity   EVALUATION COMPLEXITY: Moderate     PLAN: PT FREQUENCY: 1-2x/week   PT DURATION: 6 weeks   PLANNED INTERVENTIONS: Therapeutic  exercises, Therapeutic activity, Neuromuscular re-education, Balance training, Gait training, Patient/Family education, Joint mobilization, Stair training, Vestibular training, Canalith repositioning, Dry Needling, Electrical stimulation, Cryotherapy, Moist heat, Taping, Manual therapy, and Re-evaluation   PLAN FOR NEXT SESSION: reassess HEP, progress VOR and habituation activities  8:52 AM, 03/07/22 M. Sherlyn Lees, PT, DPT Physical Therapist- [place] Office Number: [phone]

## 2022-03-14 ENCOUNTER — Ambulatory Visit: Payer: Medicare Other

## 2022-03-14 DIAGNOSIS — R2689 Other abnormalities of gait and mobility: Secondary | ICD-10-CM

## 2022-03-14 DIAGNOSIS — R42 Dizziness and giddiness: Secondary | ICD-10-CM | POA: Diagnosis not present

## 2022-03-14 DIAGNOSIS — R2681 Unsteadiness on feet: Secondary | ICD-10-CM

## 2022-03-14 NOTE — Therapy (Signed)
OUTPATIENT PHYSICAL THERAPY TREATMENT NOTE, Progress Note, and Recertification   Patient Name: Don Ruiz MRN: [id number] DOB:[birth year], 71 y.o., male Today's Date: 03/14/2022  PCP: Lavone Orn, MD REFERRING PROVIDER: N/A  Progress Note Reporting Period 02/02/22 to 03/14/22  See note below for Objective Data and Assessment of Progress/Goals.     END OF SESSION:   PT End of Session - 03/14/22 0850     Visit Number 6    Number of Visits 16    Date for PT Re-Evaluation 05/23/22    Authorization Type [employer]    Progress Note Due on Visit 16    PT Start Time [phone]    PT Stop Time 0930    PT Time Calculation (min) 41 min    Activity Tolerance Patient tolerated treatment well    Behavior During Therapy [facility] for tasks assessed/performed             Past Medical History:  Diagnosis Date   Ankle impingement syndrome    Right ankle from bone spur   Antiphospholipid antibody syndrome ([place]) 06/12/2012   Coagulopathy ([place]) 06/12/2012   DVT (deep venous thrombosis) ([place]) 1990, 2002   chronic hypercoagulobility.    DVT, recurrent, lower extremity, chronic 06/12/2012   1992   Dysrhythmia    AF   GERD (gastroesophageal reflux disease)    H/O tinnitus    Heterozygous factor V Leiden mutation ([place]) 06/12/2012   Hyperlipidemia    Migraine headache    Protein S deficiency ([place]) 06/12/2012   Prothrombin gene mutation ([place]) 06/12/2012   Pulmonary embolism ([place])    Pulmonary embolism, bilateral ([place]) 06/12/2012   July, 2002   PVC (premature ventricular contraction)    Varicose veins 06/12/2012   RLE   Wears hearing aid    Past Surgical History:  Procedure Laterality Date   ABDOMINAL AORTIC ANEURYSM REPAIR  12/14   [facility]   ANKLE SURGERY  2005   right   APPENDECTOMY     ARTERY BIOPSY Left 08/12/2014   Procedure: LEFT TEMPORAL ARTERY BIOPSY ;  Surgeon: Coralie Keens, MD;  Location: [facility];  Service: General;  Laterality: Left;   CHOLECYSTECTOMY  2008    lap choli   COLONOSCOPY N/A 09/03/2013   Procedure: COLONOSCOPY;  Surgeon: Garlan Fair, MD;  Location: [facility];  Service: Endoscopy;  Laterality: N/A;   ESOPHAGOGASTRODUODENOSCOPY N/A 01/05/2022   Procedure: ESOPHAGOGASTRODUODENOSCOPY (EGD);  Surgeon: Arta Silence, MD;  Location: [facility];  Service: Gastroenterology;  Laterality: N/A;   FINGER SURGERY     Right 1st finger,  by Dr. Kalman Shan TENDON REPAIR  2013   left 5th finger by Dr. Caralyn Guile   INCISION AND DRAINAGE HIP Left 02/17/2021   Procedure: IRRIGATION AND DEBRIDEMENT LEFT THIGH;  Surgeon: Dorna Leitz, MD;  Location: [facility];  Service: Orthopedics;  Laterality: Left;   PATELLA RELEASE AND MANIPULATION  1992   Right [place]   UPPER ESOPHAGEAL ENDOSCOPIC ULTRASOUND (EUS) Bilateral 01/05/2022   Procedure: UPPER ESOPHAGEAL ENDOSCOPIC ULTRASOUND (EUS);  Surgeon: Arta Silence, MD;  Location: [facility];  Service: Gastroenterology;  Laterality: Bilateral;   Patient Active Problem List   Diagnosis Date Noted   Vertigo 08/28/2021   PAF (paroxysmal atrial fibrillation) ([place]) 08/28/2021   Hematoma of left thigh 02/17/2021   Elevated LFTs    Acute epigastric pain 02/04/2017   HTN (hypertension) 09/27/2013   SNHL (sensorineural hearing loss) 05/24/2013   CAD (coronary artery disease) 04/18/2013   Chronic anticoagulation 04/18/2013  GERD (gastroesophageal reflux disease) 04/18/2013   History of pulmonary embolism 04/18/2013   Blood clotting disorder ([place]) 06/12/2012   Heterozygous factor V Leiden mutation ([place]) 06/12/2012   Prothrombin gene mutation ([place]) 06/12/2012   Antiphospholipid antibody syndrome ([place]) 06/12/2012   Protein S deficiency ([place]) 06/12/2012   DVT, recurrent, lower extremity, chronic 06/12/2012   Pulmonary embolism, bilateral ([place]) 06/12/2012   Varicose veins 06/12/2012   Chronic venous embolism and thrombosis of deep vessels of lower extremity ([place]) 06/12/2012   Primary hypercoagulable state  ([place]) 06/12/2012   Aneurysm of abdominal vessel ([place]) 03/12/2012   Dissection of aorta, abdominal ([place]) 03/12/2012   ABNORMALITY OF GAIT 07/02/2009   SPRAIN&STRAIN OTHER SPECIFIED SITES KNEE&LEG 07/02/2009    REFERRING DIAG:  Z86.718 (ICD-10-CM) - Personal history of other venous thrombosis and embolism  R42 (ICD-10-CM) - Dizziness and giddiness    THERAPY DIAG:  Dizziness and giddiness  Unsteadiness on feet  Other abnormalities of gait and mobility  Rationale for Evaluation and Treatment Rehabilitation  PERTINENT HISTORY:  L cochlear implant, clotting disorder, R hearing loss, HTN  PRECAUTIONS: Other: HOH, hears better in R ear  SUBJECTIVE: Slow improvement. The recovery from this episode seems more stubborn than the last incident. Walking and turns still cause LOB. Completing HEP 2x/day  PAIN:  Are you having pain? No   OOBJECTIVE:   VESTIBULAR TREATMENT:  TODAY'S TREATMENT: 03/14/22 Activity Comments  Prone with left head turns 10x Difficulty with fixating on target, corrective saccade  Dynamic Gait Index 22/24; improved from 20/24  VOR x 2 x 30 sec   VOR cancellation different positions   M-CTSIB condition 4 Mild sway  POC review and pt education regarding STG/LTG and progression of vestibular rehab     TODAY'S TREATMENT: 02/28/22 Activity Comments  Walk with head tilt Minimal deviation  Half kneel PNF chop 3x10 6.6# med ball--LOB  VOR cancellation tall kneeling 2x10 6.6#  Standing PNF chops 3x10 6.6# use of door frame for target--LOB  Staggered stance: EO/EC 3x15 sec or 1x10 reps One foot on airex, one on rocker board alt. Ant-post, lat-lat. Static and head rotations  Slow march 4x20 ft 10# unilateral carry, alternating  180 deg turns to cone tap           PATIENT EDUCATION: Education details: prognosis, POC, HEP, edu on how vestibular decompensation after procedure/hospitalization  Person educated: Patient Education method: Explanation, Demonstration,  Tactile cues, Verbal cues, and Handouts Education comprehension: verbalized understanding and returned demonstration   DIAGNOSTIC FINDINGS: none recent   COGNITION: Overall cognitive status: Within functional limits for tasks assessed             SENSATION: WFL     POSTURE: rounded shoulders     FUNCTIONAL TESTs:    Romberg with EC 30 sec: moderate-severe sway   Gait: without device shows intermittent path deviation and use of furniture walking   VESTIBULAR ASSESSMENT              GENERAL OBSERVATION: bruising under L eye; patient is wearing bifocals for reading- reports that he wears them most of the time                OCULOMOTOR EXAM:                       Ocular Alignment: normal                       Ocular ROM: No  Limitations                       Spontaneous Nystagmus: absent                       Gaze-Induced Nystagmus: absent                       Smooth Pursuits: intact                       Saccades: intact                       Convergence/Divergence: ~3 cm               VESTIBULAR - OCULAR REFLEX:                        Slow VOR: Comment: horizontal: slower to the L; c/o mild dizziness and L eye pain; vertical: slightly discoordinated neck movement but c/o less head symptoms                        VOR Cancellation: Unable to Maintain Gaze; limited by dizziness                       Head-Impulse Test: HIT Right: slightly positive HIT Left: positive                                    POSITIONAL TESTING:  Right Roll Test: c/o dizziness during movement which dissipates when stopping movement Left Roll Test: c/o dizziness during movement which dissipates when stopping movement Right Dix-Hallpike: negative; c/o dizziness upon laying down<sitting up Left Dix-Hallpike: negative; c/o dizziness upon laying down<sitting up   Dynamic Gait Index    1. Gait level surface __3___ (3) Normal: Walks 20', no assistive devices, good sped, no evidence for imbalance, normal  gait pattern (2) Mild Impairment: Walks 20', uses assistive devices, slower speed, mild gait deviations. (1) Moderate Impairment: Walks 20', slow speed, abnormal gait pattern, evidence for imbalance. (0) Severe Impairment: Cannot walk 20' without assistance, severe gait deviations or imbalance.  2. Change in gait speed __3___ (3) Normal: Able to smoothly change walking speed without loss of balance or gait deviation. Shows a  significant difference in walking speeds between normal, fast and slow speeds. (2) Mild Impairment: Is able to change speed but demonstrates mild gait deviations, or not gait  deviations but unable to achieve a significant change in velocity, or uses an assistive device. (1) Moderate Impairment: Makes only minor adjustments to walking speed, or accomplishes a change  in speed with significant gait deviations, or changes speed but has significant gait deviations, or  changes speed but loses balance but is able to recover and continue walking. (0) Severe Impairment: Cannot change speeds, or loses balance and has to reach for wall or be caught.  3. Gait with horizontal head turns __2___ (3) Normal: Performs head turns smoothly with no change in gait. (2) Mild Impairment: Performs head turns smoothly with slight change in gait velocity, i.e., minor  disruption to smooth gait path or uses walking aid. (1) Moderate Impairment: Performs head turns with moderate change in gait velocity, slows down,  staggers but recovers, can continue to walk. (0)  Severe Impairment: Performs task with severe disruption of gait, i.e., staggers  outside 15" path, loses balance, stops, reaches for wall.  4. Gait with vertical head turns __3___ (3) Normal: Performs head turns smoothly with no change in gait. (2) Mild Impairment: Performs head turns smoothly with slight change in gait velocity, i.e., minor  disruption to smooth gait path or uses walking aid. (1) Moderate Impairment: Performs head  turns with moderate change in gait velocity, slows down,  staggers but recovers, can continue to walk. (0) Severe Impairment: Performs task with severe disruption of gait, i.e., staggers  outside 15" path, loses balance, stops, reaches for wall.  5. Gait and pivot turn __3___ (3) Normal: Pivot turns safely within 3 seconds and stops quickly with no loss of balance. (2) Mild Impairment: Pivot turns safely in > 3 seconds and stops with no loss of balance. (1) Moderate Impairment: Turns slowly, requires verbal cueing, requires several small steps to catch  balance following turn and stop. (0) Severe Impairment: Cannot turn safely, requires assistance to turn and stop.  6. Step over obstacle __3___ (3) Normal: Is able to step over the box without changing gait speed, no evidence of imbalance. (2) Mild Impairment: Is able to step over box, but must slow down and adjust steps to clear box safely. (1) Moderate Impairment: Is able to step over box but must stop, then step over. May require verbal  cueing. (0) Severe Impairment: Cannot perform without assistance.  7. Step around obstacles __3___ (3) Normal: Is able to walk around cones safely without changing gait speed; no evidence of  imbalance. (2) Mild Impairment: Is able to step around both cones, but must slow down and adjust steps to clear  cones. (1) Moderate Impairment: Is able to clear cones but must significantly slow, speed to accomplish task,  or requires verbal cueing. (0) Severe Impairment: Unable to clear cones, walks into one or both cones, or requires physical  assistance.  8. Steps __2___ (3) Normal: Alternating feet, no rail. (2) Mild Impairment: Alternating feet, must use rail. (1) Moderate Impairment: Two feet to a stair, must use rail. (0) Severe Impairment: Cannot do safely.  TOTAL SCORE: __22___/ 24  Interpretation:  < 19/24 = predictive of falls in the elderly > 22/24 = safe ambulators     --tested on  03/14/22      GOALS: Goals reviewed with patient? Yes   SHORT TERM GOALS: Target date: 02/23/2022   Patient to be independent with initial HEP. Baseline: HEP initiated Goal status: MET       LONG TERM GOALS: Target date: 03/16/2022   Patient to be independent with advanced HEP. Baseline: Not yet initiated  Goal status: on-going   Patient to report 0/10 dizziness with standing vertical and horizontal VOR x 2 for 30 seconds. Baseline: 1/10 with VOR x 1 Goal status: REVISED   Patient will report 0/10 dizziness with bed mobility.  Baseline: Symptomatic, notes 4-5/10 with rolling in bed, repositioning is less problematic Goal status: on-going   Patient to demonstrate mild-moderate sway with M-CTSIB condition with eyes closed/foam surface in order to improve safety in environments with uneven surfaces and dim lighting. Baseline: mild x 30 sec Goal status: MET   Patient to score at least 20/24 on DGI in order to decrease risk of falls. Baseline: 22/24 Goal status: MET   Patient to report tolerance for 1-2 hour car ride without dizziness limiting.  Baseline: able, no issue Goal status: MET  >  Patient  to report and demonstrate steady posture with golf swing and follow-through to enable activity participation without LOB  -Baseline: LOB with follow-through in golf swing  -Goal status: INITIAL       ASSESSMENT:   CLINICAL IMPRESSION: 6/10 dizziness with golf swing and head follow-through and notes the response does fatigue with repeated practice/exposure. Notes when walking he is still needing to use a stationary target for visual fixation to compensate for unsteadiness.  Rolling for habituation continues to provoke symptoms, but repositioning in bed for comfort does not cause as much discomfort at this time.  Improved postural stability with M-CTSIB demonstrating mild postural sway in condition 4. Demonstrates excellent HEP recall and self-progression of intensity.  Addition of  VOR x 2 to POC/goals today and discussion with patient regarding every other week f/u for therapy sessions to progress POC details to reduce symptoms and improve activity participation (golf).   OBJECTIVE IMPAIRMENTS Abnormal gait, decreased balance, difficulty walking, and dizziness.    ACTIVITY LIMITATIONS carrying, lifting, bending, standing, squatting, sleeping, stairs, transfers, bed mobility, bathing, dressing, and reach over head   PARTICIPATION LIMITATIONS: meal prep, cleaning, laundry, driving, shopping, community activity, occupation, yard work, and church   PERSONAL FACTORS Age, Fitness, Past/current experiences, Time since onset of injury/illness/exacerbation, and 3+ comorbidities: L cochlear implant, clotting disorder, R hearing loss, HTN  are also affecting patient's functional outcome.    REHAB POTENTIAL: Good   CLINICAL DECISION MAKING: Evolving/moderate complexity   EVALUATION COMPLEXITY: Moderate     PLAN: PT FREQUENCY: 1x/week/   PT DURATION: 10 weeks   PLANNED INTERVENTIONS: Therapeutic exercises, Therapeutic activity, Neuromuscular re-education, Balance training, Gait training, Patient/Family education, Joint mobilization, Stair training, Vestibular training, Canalith repositioning, Dry Needling, Electrical stimulation, Cryotherapy, Moist heat, Taping, Manual therapy, and Re-evaluation   PLAN FOR NEXT SESSION: reassess HEP, progress VOR and habituation activities (golf swing and follow-through)  9:52 AM, 03/14/22 M. Sherlyn Lees, PT, DPT Physical Therapist- [place] Office Number: [phone]

## 2022-03-24 NOTE — Therapy (Signed)
OUTPATIENT PHYSICAL THERAPY TREATMENT NOTE   Patient Name: Don Ruiz MRN: [id number] DOB:[birth year], 71 y.o., male Today's Date: 03/28/2022  PCP: Lavone Orn, MD REFERRING PROVIDER: N/A    END OF SESSION:   PT End of Session - 03/28/22 0850     Visit Number 7    Number of Visits 16    Date for PT Re-Evaluation 05/23/22    Authorization Type [employer] Medicare    Progress Note Due on Visit 16    PT Start Time 0804    PT Stop Time 0847    PT Time Calculation (min) 43 min    Equipment Utilized During Treatment Gait belt    Activity Tolerance Patient tolerated treatment well    Behavior During Therapy WFL for tasks assessed/performed              Past Medical History:  Diagnosis Date   Ankle impingement syndrome    Right ankle from bone spur   Antiphospholipid antibody syndrome ([place]) 06/12/2012   Coagulopathy ([place]) 06/12/2012   DVT (deep venous thrombosis) ([place]) 1990, 2002   chronic hypercoagulobility.    DVT, recurrent, lower extremity, chronic 06/12/2012   1992   Dysrhythmia    AF   GERD (gastroesophageal reflux disease)    H/O tinnitus    Heterozygous factor V Leiden mutation ([place]) 06/12/2012   Hyperlipidemia    Migraine headache    Protein S deficiency ([place]) 06/12/2012   Prothrombin gene mutation ([place]) 06/12/2012   Pulmonary embolism ([place])    Pulmonary embolism, bilateral ([place]) 06/12/2012   July, 2002   PVC (premature ventricular contraction)    Varicose veins 06/12/2012   RLE   Wears hearing aid    Past Surgical History:  Procedure Laterality Date   ABDOMINAL AORTIC ANEURYSM REPAIR  12/14   [facility]   ANKLE SURGERY  2005   right   APPENDECTOMY     ARTERY BIOPSY Left 08/12/2014   Procedure: LEFT TEMPORAL ARTERY BIOPSY ;  Surgeon: Coralie Keens, MD;  Location: [facility];  Service: General;  Laterality: Left;   CHOLECYSTECTOMY  2008   lap [facility]oli   COLONOSCOPY N/A 09/03/2013   Procedure: COLONOSCOPY;  Surgeon: Garlan Fair, MD;  Location:  [facility];  Service: Endoscopy;  Laterality: N/A;   ESOPHAGOGASTRODUODENOSCOPY N/A 01/05/2022   Procedure: ESOPHAGOGASTRODUODENOSCOPY (EGD);  Surgeon: Arta Silence, MD;  Location: [facility];  Service: Gastroenterology;  Laterality: N/A;   FINGER SURGERY     Right 1st finger,  by Dr. Kalman Shan TENDON REPAIR  2013   left 5th finger by Dr. Caralyn Guile   INCISION AND DRAINAGE HIP Left 02/17/2021   Procedure: IRRIGATION AND DEBRIDEMENT LEFT THIGH;  Surgeon: Dorna Leitz, MD;  Location: [facility];  Service: Orthopedics;  Laterality: Left;   PATELLA RELEASE AND MANIPULATION  1992   Right [place]   UPPER ESOPHAGEAL ENDOSCOPIC ULTRASOUND (EUS) Bilateral 01/05/2022   Procedure: UPPER ESOPHAGEAL ENDOSCOPIC ULTRASOUND (EUS);  Surgeon: Arta Silence, MD;  Location: [facility];  Service: Gastroenterology;  Laterality: Bilateral;   Patient Active Problem List   Diagnosis Date Noted   Vertigo 08/28/2021   PAF (paroxysmal atrial fibrillation) ([place]) 08/28/2021   Hematoma of left thigh 02/17/2021   Elevated LFTs    Acute epigastric pain 02/04/2017   HTN (hypertension) 09/27/2013   SNHL (sensorineural hearing loss) 05/24/2013   CAD (coronary artery disease) 04/18/2013   Chronic anticoagulation 04/18/2013   GERD (gastroesophageal reflux disease) 04/18/2013   History of pulmonary embolism 04/18/2013  Blood clotting disorder ([place]) 06/12/2012   Heterozygous factor V Leiden mutation ([place]) 06/12/2012   Prothrombin gene mutation ([place]) 06/12/2012   Antiphospholipid antibody syndrome ([place]) 06/12/2012   Protein S deficiency ([place]) 06/12/2012   DVT, recurrent, lower extremity, chronic 06/12/2012   Pulmonary embolism, bilateral ([place]) 06/12/2012   Varicose veins 06/12/2012   Chronic venous embolism and thrombosis of deep vessels of lower extremity ([place]) 06/12/2012   Primary hypercoagulable state ([place]) 06/12/2012   Aneurysm of abdominal vessel ([place]) 03/12/2012   Dissection of aorta, abdominal ([place])  03/12/2012   ABNORMALITY OF GAIT 07/02/2009   SPRAIN&STRAIN OTHER SPECIFIED SITES KNEE&LEG 07/02/2009    REFERRING DIAG:  Z86.718 (ICD-10-CM) - Personal history of other venous thrombosis and embolism  R42 (ICD-10-CM) - Dizziness and giddiness    THERAPY DIAG:  Dizziness and giddiness  Unsteadiness on feet  Other abnormalities of gait and mobility  Rationale for Evaluation and Treatment Rehabilitation  PERTINENT HISTORY:  L cochlear implant, clotting disorder, R hearing loss, HTN  PRECAUTIONS: Other: HOH, hears better in R ear  SUBJECTIVE: Not feeling as good as he would hope. Feels like he is significantly unstable and no improvement with balance exercises. Also reports that he still feels disorientation since last session. Wondering if he needs to lower his expectations.  PAIN:  Are you having pain? No   OOBJECTIVE:   VESTIBULAR TREATMENT:    TODAY'S TREATMENT: 03/28/22 Activity Comments  R HIT  positive  L HIT Positive   standing VOR vertical and horizontal on foam 30"  C/o some visual blurring   Romberg foam EC 30" Mild-mod sway  R/L brandt daroff x4 Quick speed; c/o only mild dizziness   R/L rolling 3x each C/o increased dizziness to L  gait + horizontal and vertical head movements 2x each CGA; mild-mod imbalance and c/o dizziness   Diagonal VOR cancellation 10x each C/o mild L>R dizziness   Simulation of golf swing  Imbalance and dizziness L>R   HOME EXERCISE PROGRAM Last updated: 03/28/22 Access Code: [email or web address] URL: [email or web address] Date: 03/28/2022 Prepared by: [facility]  Program Notes perform every other day  Exercises - Standing Gaze Stabilization with Head Rotation  - 1 x daily - 3-4 x weekly - 2 sets - 30-40 sec hold - Standing Gaze Stabilization with Head Nod  - 1 x daily - 3-4 x weekly - 2-3 sets - 30-40  sec hold - Gait with Head Nods and Turns on Grass  - 1 x daily - 5 x weekly - 2  sets - 10 reps - Supine to Left Sidelying Vestibular Habituation  - 1 x daily - 5 x weekly - 3 sets - 5 reps - Romberg Stance Eyes Closed on Foam Pad  - 1 x daily - 5 x weekly - 2 sets - 30 sec hold    PATIENT EDUCATION: Education details: prognosis and safety/balance in the dark; HEP update Person educated: Patient Education method: Explanation, Demonstration, Tactile cues, Verbal cues, and Handouts Education comprehension: verbalized understanding and returned demonstration   DIAGNOSTIC FINDINGS: none recent   COGNITION: Overall cognitive status: Within functional limits for tasks assessed             SENSATION: WFL     POSTURE: rounded shoulders     FUNCTIONAL TESTs:    Romberg with EC 30 sec: moderate-severe sway   Gait: without device shows intermittent path deviation and use of furniture walking   VESTIBULAR ASSESSMENT  GENERAL OBSERVATION: bruising under L eye; patient is wearing bifocals for reading- reports that he wears them most of the time                OCULOMOTOR EXAM:                       Ocular Alignment: normal                       Ocular ROM: No Limitations                       Spontaneous Nystagmus: absent                       Gaze-Induced Nystagmus: absent                       Smooth Pursuits: intact                       Saccades: intact                       Convergence/Divergence: ~3 cm               VESTIBULAR - OCULAR REFLEX:                        Slow VOR: Comment: horizontal: slower to the L; c/o mild dizziness and L eye pain; vertical: slightly discoordinated neck movement but c/o less head symptoms                        VOR Cancellation: Unable to Maintain Gaze; limited by dizziness                       Head-Impulse Test: HIT Right: slightly positive HIT Left: positive                                    POSITIONAL TESTING:  Right Roll Test: c/o dizziness during movement which dissipates when stopping movement Left  Roll Test: c/o dizziness during movement which dissipates when stopping movement Right Dix-Hallpike: negative; c/o dizziness upon laying down<sitting up Left Dix-Hallpike: negative; c/o dizziness upon laying down<sitting up   Dynamic Gait Index    1. Gait level surface __3___ (3) Normal: Walks 20', no assistive devices, good sped, no evidence for imbalance, normal gait pattern (2) Mild Impairment: Walks 20', uses assistive devices, slower speed, mild gait deviations. (1) Moderate Impairment: Walks 20', slow speed, abnormal gait pattern, evidence for imbalance. (0) Severe Impairment: Cannot walk 20' without assistance, severe gait deviations or imbalance.  2. Change in gait speed __3___ (3) Normal: Able to smoothly change walking speed without loss of balance or gait deviation. Shows a  significant difference in walking speeds between normal, fast and slow speeds. (2) Mild Impairment: Is able to change speed but demonstrates mild gait deviations, or not gait  deviations but unable to achieve a significant change in velocity, or uses an assistive device. (1) Moderate Impairment: Makes only minor adjustments to walking speed, or accomplishes a change  in speed with significant gait deviations, or changes speed but has significant gait deviations, or  changes speed but loses balance but is able to recover  and continue walking. (0) Severe Impairment: Cannot change speeds, or loses balance and has to reach for wall or be caught.  3. Gait with horizontal head turns __2___ (3) Normal: Performs head turns smoothly with no change in gait. (2) Mild Impairment: Performs head turns smoothly with slight change in gait velocity, i.e., minor  disruption to smooth gait path or uses walking aid. (1) Moderate Impairment: Performs head turns with moderate change in gait velocity, slows down,  staggers but recovers, can continue to walk. (0) Severe Impairment: Performs task with severe disruption of gait,  i.e., staggers  outside 15" path, loses balance, stops, reaches for wall.  4. Gait with vertical head turns __3___ (3) Normal: Performs head turns smoothly with no change in gait. (2) Mild Impairment: Performs head turns smoothly with slight change in gait velocity, i.e., minor  disruption to smooth gait path or uses walking aid. (1) Moderate Impairment: Performs head turns with moderate change in gait velocity, slows down,  staggers but recovers, can continue to walk. (0) Severe Impairment: Performs task with severe disruption of gait, i.e., staggers  outside 15" path, loses balance, stops, reaches for wall.  5. Gait and pivot turn __3___ (3) Normal: Pivot turns safely within 3 seconds and stops quickly with no loss of balance. (2) Mild Impairment: Pivot turns safely in > 3 seconds and stops with no loss of balance. (1) Moderate Impairment: Turns slowly, requires verbal cueing, requires several small steps to catch  balance following turn and stop. (0) Severe Impairment: Cannot turn safely, requires assistance to turn and stop.  6. Step over obstacle __3___ (3) Normal: Is able to step over the box without changing gait speed, no evidence of imbalance. (2) Mild Impairment: Is able to step over box, but must slow down and adjust steps to clear box safely. (1) Moderate Impairment: Is able to step over box but must stop, then step over. May require verbal  cueing. (0) Severe Impairment: Cannot perform without assistance.  7. Step around obstacles __3___ (3) Normal: Is able to walk around cones safely without changing gait speed; no evidence of  imbalance. (2) Mild Impairment: Is able to step around both cones, but must slow down and adjust steps to clear  cones. (1) Moderate Impairment: Is able to clear cones but must significantly slow, speed to accomplish task,  or requires verbal cueing. (0) Severe Impairment: Unable to clear cones, walks into one or both cones, or requires physical   assistance.  8. Steps __2___ (3) Normal: Alternating feet, no rail. (2) Mild Impairment: Alternating feet, must use rail. (1) Moderate Impairment: Two feet to a stair, must use rail. (0) Severe Impairment: Cannot do safely.  TOTAL SCORE: __22___/ 24  Interpretation:  < 19/24 = predictive of falls in the elderly > 22/24 = safe ambulators     --tested on 03/14/22      GOALS: Goals reviewed with patient? Yes   SHORT TERM GOALS: Target date: 02/23/2022   Patient to be independent with initial HEP. Baseline: HEP initiated Goal status: MET       LONG TERM GOALS: Target date: 03/16/2022   Patient to be independent with advanced HEP. Baseline: Not yet initiated  Goal status: on-going   Patient to report 0/10 dizziness with standing vertical and horizontal VOR x 2 for 30 seconds. Baseline: 1/10 with VOR x 1 Goal status: REVISED   Patient will report 0/10 dizziness with bed mobility.  Baseline: Symptomatic, notes 4-5/10 with rolling in bed, repositioning is  less problematic Goal status: on-going   Patient to demonstrate mild-moderate sway with M-CTSIB condition with eyes closed/foam surface in order to improve safety in environments with uneven surfaces and dim lighting. Baseline: mild x 30 sec Goal status: MET   Patient to score at least 20/24 on DGI in order to decrease risk of falls. Baseline: 22/24 Goal status: MET   Patient to report tolerance for 1-2 hour car ride without dizziness limiting.  Baseline: able, no issue Goal status: MET  >  Patient to report and demonstrate steady posture with golf swing and follow-through to enable activity participation without LOB  -Baseline: LOB with follow-through in golf swing  -Goal status: INITIAL       ASSESSMENT:   CLINICAL IMPRESSION: Patient arrived to session with concerns about his slow progress with therapy and continued imbalance. Reassessed HIT which was positive to B side, perhaps explaining continued  symptoms. Took time to educate patient on impacts of B vestibular loss on balance and function. Reviewed activities patient still has issues with, with reports of most symptoms to the L and worst with rotation.  Updated HEP- patient reported understanding. No complaints at end of session.    OBJECTIVE IMPAIRMENTS Abnormal gait, decreased balance, difficulty walking, and dizziness.    ACTIVITY LIMITATIONS carrying, lifting, bending, standing, squatting, sleeping, stairs, transfers, bed mobility, bathing, dressing, and reach over head   PARTICIPATION LIMITATIONS: meal prep, cleaning, laundry, driving, shopping, community activity, occupation, yard work, and church   PERSONAL FACTORS Age, Fitness, Past/current experiences, Time since onset of injury/illness/exacerbation, and 3+ comorbidities: L cochlear implant, clotting disorder, R hearing loss, HTN  are also affecting patient's functional outcome.    REHAB POTENTIAL: Good   CLINICAL DECISION MAKING: Evolving/moderate complexity   EVALUATION COMPLEXITY: Moderate     PLAN: PT FREQUENCY: 1x/week/   PT DURATION: 10 weeks   PLANNED INTERVENTIONS: Therapeutic exercises, Therapeutic activity, Neuromuscular re-education, Balance training, Gait training, Patient/Family education, Joint mobilization, Stair training, Vestibular training, Canalith repositioning, Dry Needling, Electrical stimulation, Cryotherapy, Moist heat, Taping, Manual therapy, and Re-evaluation   PLAN FOR NEXT SESSION: reassess HEP, progress VOR and habituation activities (golf swing and follow-through)  Janene Harvey, PT, DPT 03/28/22 8:51 AM  [facility] [address] [place], [place], [place] [address] Phone # [phone] Fax # [phone]

## 2022-03-28 ENCOUNTER — Ambulatory Visit: Payer: Medicare Other | Admitting: Physical Therapy

## 2022-03-28 ENCOUNTER — Encounter: Payer: Self-pay | Admitting: Physical Therapy

## 2022-03-28 DIAGNOSIS — R42 Dizziness and giddiness: Secondary | ICD-10-CM | POA: Diagnosis not present

## 2022-03-28 DIAGNOSIS — R2681 Unsteadiness on feet: Secondary | ICD-10-CM

## 2022-03-28 DIAGNOSIS — R2689 Other abnormalities of gait and mobility: Secondary | ICD-10-CM

## 2022-04-04 ENCOUNTER — Ambulatory Visit: Payer: Medicare Other | Admitting: Physical Therapy

## 2022-04-06 NOTE — Therapy (Signed)
OUTPATIENT PHYSICAL THERAPY TREATMENT NOTE   Patient Name: Don Ruiz MRN: [id number] DOB:September 24, 1950, 71 y.o., male Today's Date: 04/07/2022  PCP: Don Orn, MD REFERRING PROVIDER: N/A    END OF SESSION:   PT End of Session - 04/07/22 1424     Visit Number 8    Number of Visits 16    Date for PT Re-Evaluation 05/23/22    Authorization Type [employer] Medicare    Progress Note Due on Visit 16    PT Start Time 1400    PT Stop Time 1423    PT Time Calculation (min) 23 min    Activity Tolerance Other (comment)   dizziness   Behavior During Therapy WFL for tasks assessed/performed               Past Medical History:  Diagnosis Date   Ankle impingement syndrome    Right ankle from bone spur   Antiphospholipid antibody syndrome ([place]) 06/12/2012   Coagulopathy ([place]) 06/12/2012   DVT (deep venous thrombosis) ([place]) 1990, 2002   chronic hypercoagulobility.    DVT, recurrent, lower extremity, chronic 06/12/2012   1992   Dysrhythmia    AF   GERD (gastroesophageal reflux disease)    H/O tinnitus    Heterozygous factor V Leiden mutation ([place]) 06/12/2012   Hyperlipidemia    Migraine headache    Protein S deficiency ([place]) 06/12/2012   Prothrombin gene mutation ([place]) 06/12/2012   Pulmonary embolism ([place])    Pulmonary embolism, bilateral ([place]) 06/12/2012   July, 2002   PVC (premature ventricular contraction)    Varicose veins 06/12/2012   RLE   Wears hearing aid    Past Surgical History:  Procedure Laterality Date   ABDOMINAL AORTIC ANEURYSM REPAIR  12/14   [facility]   ANKLE SURGERY  2005   right   APPENDECTOMY     ARTERY BIOPSY Left 08/12/2014   Procedure: LEFT TEMPORAL ARTERY BIOPSY ;  Surgeon: Don Keens, MD;  Location: [facility];  Service: General;  Laterality: Left;   CHOLECYSTECTOMY  2008   lap choli   COLONOSCOPY N/A 09/03/2013   Procedure: COLONOSCOPY;  Surgeon: Don Fair, MD;  Location: [facility];  Service: Endoscopy;  Laterality: N/A;    ESOPHAGOGASTRODUODENOSCOPY N/A 01/05/2022   Procedure: ESOPHAGOGASTRODUODENOSCOPY (EGD);  Surgeon: Don Silence, MD;  [facility];  Service: Gastroenterology;  Laterality: N/A;   FINGER SURGERY     Right 1st finger,  by Dr. Kalman Shan TENDON REPAIR  2013   left 5th finger by Dr. Caralyn Guile   INCISION AND DRAINAGE HIP Left 02/17/2021   Procedure: IRRIGATION AND DEBRIDEMENT LEFT THIGH;  Surgeon: Don Leitz, MD;  Location: [facility];  Service: Orthopedics;  Laterality: Left;   PATELLA RELEASE AND MANIPULATION  1992   Right [place]   UPPER ESOPHAGEAL ENDOSCOPIC ULTRASOUND (EUS) Bilateral 01/05/2022   Procedure: UPPER ESOPHAGEAL ENDOSCOPIC ULTRASOUND (EUS);  Surgeon: Don Silence, MD;  [facility];  Service: Gastroenterology;  Laterality: Bilateral;   Patient Active Problem List   Diagnosis Date Noted   Vertigo 08/28/2021   PAF (paroxysmal atrial fibrillation) ([place]) 08/28/2021   Hematoma of left thigh 02/17/2021   Elevated LFTs    Acute epigastric pain 02/04/2017   HTN (hypertension) 09/27/2013   SNHL (sensorineural hearing loss) 05/24/2013   CAD (coronary artery disease) 04/18/2013   Chronic anticoagulation 04/18/2013   GERD (gastroesophageal reflux disease) 04/18/2013   History of pulmonary embolism 04/18/2013   Blood clotting disorder ([place]) 06/12/2012  Heterozygous factor V Leiden mutation ([place]) 06/12/2012   Prothrombin gene mutation ([facility]) 06/12/2012   Antiphospholipid antibody syndrome ([place]) 06/12/2012   Protein S deficiency ([place]) 06/12/2012   DVT, recurrent, lower extremity, chronic 06/12/2012   Pulmonary embolism, bilateral ([place]) 06/12/2012   Varicose veins 06/12/2012   Chronic venous embolism and thrombosis of deep vessels of lower extremity ([place]) 06/12/2012   Primary hypercoagulable state ([place]) 06/12/2012   Aneurysm of abdominal vessel ([place]) 03/12/2012   Dissection of aorta, abdominal ([place]) 03/12/2012   ABNORMALITY OF GAIT 07/02/2009   SPRAIN&STRAIN  OTHER SPECIFIED SITES KNEE&LEG 07/02/2009    REFERRING DIAG:  Z86.718 (ICD-10-CM) - Personal history of other venous thrombosis and embolism  R42 (ICD-10-CM) - Dizziness and giddiness    THERAPY DIAG:  Dizziness and giddiness  Unsteadiness on feet  Other abnormalities of gait and mobility  Rationale for Evaluation and Treatment Rehabilitation  PERTINENT HISTORY:  L cochlear implant, clotting disorder, R hearing loss, HTN  PRECAUTIONS: Other: HOH, hears better in R ear  SUBJECTIVE: Have had a horrible day today- feeling disoriented and unstable. Noticing that when he takes a Tylenol PM makes him foggy the next day. Noticing more trouble with balance today too. Has a f/u appointment with ENT later this month.  PAIN:  Are you having pain? No   OBJECTIVE:   VESTIBULAR TREATMENT 04/07/22:   PATIENT EDUCATION: Education details: answered patient's questions on vestibular decompensation and effects of cochlear implant surgery on vestibular hypofunction, citing the following 3 articles and encouraged f/u with patient's ENT as he asks about the function in his R ear and wanting more information on his potential prognosis. Discussed importance of continued physical fitness for overall vestibular health   Don Ruiz, Don Ruiz, Don Ruiz. Vestibular rehabilitation therapy: review of indications, mechanisms, and key exercises. Don Ruiz. 2011 Dec;7(4):184-96. doi: 10.3988/[id number]. Epub 2011 Dec 29. PMID: [id number]; PMCID: Don Ruiz.  Don Ruiz, Don Ruiz, Don Ruiz, Don Ruiz Effect of cochlear implant surgery on vestibular function: meta-analysis study. Don Ruiz. 2017 Jun 8;46(1):44. doi: 10.1186/[id number]. PMID: 98921194; PMCID: Don Ruiz.  Don Ruiz, Don Ruiz, Don Ruiz, Don Ruiz, Don Ruiz, Don Ruiz. Long-term course and relapses of vestibular and balance disorders. Don Ruiz. 2010;28(1):69-82. doi: 10.3233/RNN-2010-0504. PMID:  85631497.  Person educated: Patient Education method: Explanation Education comprehension: verbalized understanding   HOME EXERCISE PROGRAM Last updated: 03/28/22 Access Code: [email or web address] URL: [email or web address] Date: 03/28/2022 Prepared by: [facility]  Program Notes perform every other day  Exercises - Standing Gaze Stabilization with Head Rotation  - 1 x daily - 3-4 x weekly - 2 sets - 30-40 sec hold - Standing Gaze Stabilization with Head Nod  - 1 x daily - 3-4 x weekly - 2-3 sets - 30-40  sec hold - Gait with Head Nods and Turns on Grass  - 1 x daily - 5 x weekly - 2 sets - 10 reps - Supine to Left Sidelying Vestibular Habituation  - 1 x daily - 5 x weekly - 3 sets - 5 reps - Romberg Stance Eyes Closed on Foam Pad  - 1 x daily - 5 x weekly - 2 sets - 30 sec hold    PATIENT EDUCATION: Education details: prognosis and safety/balance in the dark; HEP update Person educated: Patient Education method: Explanation, Demonstration, Tactile cues, Verbal cues, and Handouts Education comprehension: verbalized understanding and returned demonstration   DIAGNOSTIC FINDINGS: none recent   COGNITION: Overall cognitive status:  Within functional limits for tasks assessed             SENSATION: WFL     POSTURE: rounded shoulders     FUNCTIONAL TESTs:    Romberg with EC 30 sec: moderate-severe sway   Gait: without device shows intermittent path deviation and use of furniture walking   VESTIBULAR ASSESSMENT              GENERAL OBSERVATION: bruising under L eye; patient is wearing bifocals for reading- reports that he wears them most of the time                OCULOMOTOR EXAM:                       Ocular Alignment: normal                       Ocular ROM: No Limitations                       Spontaneous Nystagmus: absent                       Gaze-Induced Nystagmus: absent                       Smooth Pursuits: intact                        Saccades: intact                       Convergence/Divergence: ~3 cm               VESTIBULAR - OCULAR REFLEX:                        Slow VOR: Comment: horizontal: slower to the L; c/o mild dizziness and L eye pain; vertical: slightly discoordinated neck movement but c/o less head symptoms                        VOR Cancellation: Unable to Maintain Gaze; limited by dizziness                       Head-Impulse Test: HIT Right: slightly positive HIT Left: positive                                    POSITIONAL TESTING:  Right Roll Test: c/o dizziness during movement which dissipates when stopping movement Left Roll Test: c/o dizziness during movement which dissipates when stopping movement Right Dix-Hallpike: negative; c/o dizziness upon laying down<sitting up Left Dix-Hallpike: negative; c/o dizziness upon laying down<sitting up   Dynamic Gait Index    1. Gait level surface __3___ (3) Normal: Walks 20', no assistive devices, good sped, no evidence for imbalance, normal gait pattern (2) Mild Impairment: Walks 20', uses assistive devices, slower speed, mild gait deviations. (1) Moderate Impairment: Walks 20', slow speed, abnormal gait pattern, evidence for imbalance. (0) Severe Impairment: Cannot walk 20' without assistance, severe gait deviations or imbalance.  2. Change in gait speed __3___ (3) Normal: Able to smoothly change walking speed without loss of balance or gait deviation. Shows a  significant difference in walking speeds between normal, fast and slow  speeds. (2) Mild Impairment: Is able to change speed but demonstrates mild gait deviations, or not gait  deviations but unable to achieve a significant change in velocity, or uses an assistive device. (1) Moderate Impairment: Makes only minor adjustments to walking speed, or accomplishes a change  in speed with significant gait deviations, or changes speed but has significant gait deviations, or  changes speed  but loses balance but is able to recover and continue walking. (0) Severe Impairment: Cannot change speeds, or loses balance and has to reach for wall or be caught.  3. Gait with horizontal head turns __2___ (3) Normal: Performs head turns smoothly with no change in gait. (2) Mild Impairment: Performs head turns smoothly with slight change in gait velocity, i.e., minor  disruption to smooth gait path or uses walking aid. (1) Moderate Impairment: Performs head turns with moderate change in gait velocity, slows down,  staggers but recovers, can continue to walk. (0) Severe Impairment: Performs task with severe disruption of gait, i.e., staggers  outside 15" path, loses balance, stops, reaches for wall.  4. Gait with vertical head turns __3___ (3) Normal: Performs head turns smoothly with no change in gait. (2) Mild Impairment: Performs head turns smoothly with slight change in gait velocity, i.e., minor  disruption to smooth gait path or uses walking aid. (1) Moderate Impairment: Performs head turns with moderate change in gait velocity, slows down,  staggers but recovers, can continue to walk. (0) Severe Impairment: Performs task with severe disruption of gait, i.e., staggers  outside 15" path, loses balance, stops, reaches for wall.  5. Gait and pivot turn __3___ (3) Normal: Pivot turns safely within 3 seconds and stops quickly with no loss of balance. (2) Mild Impairment: Pivot turns safely in > 3 seconds and stops with no loss of balance. (1) Moderate Impairment: Turns slowly, requires verbal cueing, requires several small steps to catch  balance following turn and stop. (0) Severe Impairment: Cannot turn safely, requires assistance to turn and stop.  6. Step over obstacle __3___ (3) Normal: Is able to step over the box without changing gait speed, no evidence of imbalance. (2) Mild Impairment: Is able to step over box, but must slow down and adjust steps to clear box safely. (1)  Moderate Impairment: Is able to step over box but must stop, then step over. May require verbal  cueing. (0) Severe Impairment: Cannot perform without assistance.  7. Step around obstacles __3___ (3) Normal: Is able to walk around cones safely without changing gait speed; no evidence of  imbalance. (2) Mild Impairment: Is able to step around both cones, but must slow down and adjust steps to clear  cones. (1) Moderate Impairment: Is able to clear cones but must significantly slow, speed to accomplish task,  or requires verbal cueing. (0) Severe Impairment: Unable to clear cones, walks into one or both cones, or requires physical  assistance.  8. Steps __2___ (3) Normal: Alternating feet, no rail. (2) Mild Impairment: Alternating feet, must use rail. (1) Moderate Impairment: Two feet to a stair, must use rail. (0) Severe Impairment: Cannot do safely.  TOTAL SCORE: __22___/ 24  Interpretation:  < 19/24 = predictive of falls in the elderly > 22/24 = safe ambulators     --tested on 03/14/22      GOALS: Goals reviewed with patient? Yes   SHORT TERM GOALS: Target date: 02/23/2022   Patient to be independent with initial HEP. Baseline: HEP initiated Goal status: MET  LONG TERM GOALS: Target date: 03/16/2022   Patient to be independent with advanced HEP. Baseline: Not yet initiated  Goal status: on-going   Patient to report 0/10 dizziness with standing vertical and horizontal VOR x 2 for 30 seconds. Baseline: 1/10 with VOR x 1 Goal status: REVISED   Patient will report 0/10 dizziness with bed mobility.  Baseline: Symptomatic, notes 4-5/10 with rolling in bed, repositioning is less problematic Goal status: on-going   Patient to demonstrate mild-moderate sway with Ruiz-CTSIB condition with eyes closed/foam surface in order to improve safety in environments with uneven surfaces and dim lighting. Baseline: mild x 30 sec Goal status: MET   Patient to score at least 20/24  on DGI in order to decrease risk of falls. Baseline: 22/24 Goal status: MET   Patient to report tolerance for 1-2 hour car ride without dizziness limiting.  Baseline: able, no issue Goal status: MET  >  Patient to report and demonstrate steady posture with golf swing and follow-through to enable activity participation without LOB  -Baseline: LOB with follow-through in golf swing  -Goal status: INITIAL       ASSESSMENT:   CLINICAL IMPRESSION: Patient arrived to session with report of increased feelings of instability and disorientation. Notes noticing these symptoms after taking Tylenol PM the day before. Reports that he has an ENT f/u on 04/28/22. Session today focused on patient education on patient's questions vestibular decompensation and effects of cochlear implant on vestibular system. Received patient's verbal consent to reach out to Dr. Gearldine Bienenstock with PT concerns and possible barriers to rehab progress. Patient opted not to participate in vestibular rehab today d/Ruiz not feeling well.    OBJECTIVE IMPAIRMENTS Abnormal gait, decreased balance, difficulty walking, and dizziness.    ACTIVITY LIMITATIONS carrying, lifting, bending, standing, squatting, sleeping, stairs, transfers, bed mobility, bathing, dressing, and reach over head   PARTICIPATION LIMITATIONS: meal prep, cleaning, laundry, driving, shopping, community activity, occupation, yard work, and church   PERSONAL FACTORS Age, Fitness, Past/current experiences, Time since onset of injury/illness/exacerbation, and 3+ comorbidities: L cochlear implant, clotting disorder, R hearing loss, HTN  are also affecting patient's functional outcome.    REHAB POTENTIAL: Good   CLINICAL DECISION MAKING: Evolving/moderate complexity   EVALUATION COMPLEXITY: Moderate     PLAN: PT FREQUENCY: 1x/week/   PT DURATION: 10 weeks   PLANNED INTERVENTIONS: Therapeutic exercises, Therapeutic activity, Neuromuscular re-education, Balance  training, Gait training, Patient/Family education, Joint mobilization, Stair training, Vestibular training, Canalith repositioning, Dry Needling, Electrical stimulation, Cryotherapy, Moist heat, Taping, Manual therapy, and Re-evaluation   PLAN FOR NEXT SESSION: reassess HEP, progress VOR and habituation activities (golf swing and follow-through)  Janene Harvey, PT, DPT 04/07/22 2:32 PM  [facility] [address] [place], [place], [place] [address] Phone # [phone] Fax # [phone]

## 2022-04-07 ENCOUNTER — Ambulatory Visit: Payer: Medicare Other | Attending: Internal Medicine | Admitting: Physical Therapy

## 2022-04-07 ENCOUNTER — Telehealth: Payer: Self-pay | Admitting: Physical Therapy

## 2022-04-07 ENCOUNTER — Encounter: Payer: Self-pay | Admitting: Physical Therapy

## 2022-04-07 DIAGNOSIS — R2689 Other abnormalities of gait and mobility: Secondary | ICD-10-CM | POA: Diagnosis present

## 2022-04-07 DIAGNOSIS — R42 Dizziness and giddiness: Secondary | ICD-10-CM | POA: Diagnosis present

## 2022-04-07 DIAGNOSIS — R2681 Unsteadiness on feet: Secondary | ICD-10-CM

## 2022-04-07 NOTE — Telephone Encounter (Signed)
Hi Dr. Gearldine Bienenstock,  I have been seeing Mr. Don Ruiz in [place] for dizziness per his PCP's referral s/p cochlear implantation. He is next scheduled to see you on 04/28/22. I previously treated him for L vestibular neuritis which was just about fully resolved by D/C on 11/01/21. This time around, we are having a harder time resolving his dizziness. I initially suspected vestibular decompensation of the L ear, however HIT showed a new positive on the R. Despite great compliance with HEP and maintaining an active lifestyle, he has had poor rehab outcomes. I wanted to reach out to let you know of my concerns and see if there were any recommendations you had for him.  Thanks in advance,  Janene Harvey, PT, DPT 04/07/22 2:40 PM  [facility] at [facility], [place], [place] [address] Phone # [phone] Fax # [phone]

## 2022-04-08 NOTE — Therapy (Signed)
OUTPATIENT PHYSICAL THERAPY TREATMENT NOTE   Patient Name: Don Ruiz MRN: [id number] DOB:May 22, 1951, 71 y.o., male Today's Date: 04/11/2022  PCP: Lavone Orn, MD REFERRING PROVIDER: N/A    END OF SESSION:   PT End of Session - 04/11/22 1103     Visit Number 9    Number of Visits 16    Date for PT Re-Evaluation 05/23/22    Authorization Type [employer] Medicare    Progress Note Due on Visit 16    PT Start Time 1022    PT Stop Time 1100    PT Time Calculation (min) 38 min    Equipment Utilized During Treatment Gait belt    Activity Tolerance Patient tolerated treatment well    Behavior During Therapy WFL for tasks assessed/performed                Past Medical History:  Diagnosis Date   Ankle impingement syndrome    Right ankle from bone spur   Antiphospholipid antibody syndrome ([place]) 06/12/2012   Coagulopathy ([place]) 06/12/2012   DVT (deep venous thrombosis) ([place]) 1990, 2002   chronic hypercoagulobility.    DVT, recurrent, lower extremity, chronic 06/12/2012   1992   Dysrhythmia    AF   GERD (gastroesophageal reflux disease)    H/O tinnitus    Heterozygous factor V Leiden mutation ([place]) 06/12/2012   Hyperlipidemia    Migraine headache    Protein S deficiency ([place]) 06/12/2012   Prothrombin gene mutation ([place]) 06/12/2012   Pulmonary embolism ([place])    Pulmonary embolism, bilateral ([place]) 06/12/2012   July, 2002   PVC (premature ventricular contraction)    Varicose veins 06/12/2012   RLE   Wears hearing aid    Past Surgical History:  Procedure Laterality Date   ABDOMINAL AORTIC ANEURYSM REPAIR  12/14   [facility]   ANKLE SURGERY  2005   right   APPENDECTOMY     ARTERY BIOPSY Left 08/12/2014   Procedure: LEFT TEMPORAL ARTERY BIOPSY ;  Surgeon: Coralie Keens, MD;  Location: [facility];  Service: General;  Laterality: Left;   CHOLECYSTECTOMY  2008   lap [facility]oli   COLONOSCOPY N/A 09/03/2013   Procedure: COLONOSCOPY;  Surgeon: Garlan Fair, MD;   Location: [facility];  Service: Endoscopy;  Laterality: N/A;   ESOPHAGOGASTRODUODENOSCOPY N/A 01/05/2022   Procedure: ESOPHAGOGASTRODUODENOSCOPY (EGD);  Surgeon: Arta Silence, MD;  Location: [facility];  Service: Gastroenterology;  Laterality: N/A;   FINGER SURGERY     Right 1st finger,  by Dr. Kalman Shan TENDON REPAIR  2013   left 5th finger by Dr. Caralyn Guile   INCISION AND DRAINAGE HIP Left 02/17/2021   Procedure: IRRIGATION AND DEBRIDEMENT LEFT THIGH;  Surgeon: Dorna Leitz, MD;  Location: [facility];  Service: Orthopedics;  Laterality: Left;   PATELLA RELEASE AND MANIPULATION  1992   Right [place]   UPPER ESOPHAGEAL ENDOSCOPIC ULTRASOUND (EUS) Bilateral 01/05/2022   Procedure: UPPER ESOPHAGEAL ENDOSCOPIC ULTRASOUND (EUS);  Surgeon: Arta Silence, MD;  Location: [facility];  Service: Gastroenterology;  Laterality: Bilateral;   Patient Active Problem List   Diagnosis Date Noted   Vertigo 08/28/2021   PAF (paroxysmal atrial fibrillation) ([place]) 08/28/2021   Hematoma of left thigh 02/17/2021   Elevated LFTs    Acute epigastric pain 02/04/2017   HTN (hypertension) 09/27/2013   SNHL (sensorineural hearing loss) 05/24/2013   CAD (coronary artery disease) 04/18/2013   Chronic anticoagulation 04/18/2013   GERD (gastroesophageal reflux disease) 04/18/2013   History of pulmonary embolism  04/18/2013   Blood clotting disorder ([place]) 06/12/2012   Heterozygous factor V Leiden mutation ([place]) 06/12/2012   Prothrombin gene mutation ([place]) 06/12/2012   Antiphospholipid antibody syndrome ([place]) 06/12/2012   Protein S deficiency ([place]) 06/12/2012   DVT, recurrent, lower extremity, chronic 06/12/2012   Pulmonary embolism, bilateral ([place]) 06/12/2012   Varicose veins 06/12/2012   Chronic venous embolism and thrombosis of deep vessels of lower extremity ([place]) 06/12/2012   Primary hypercoagulable state ([place]) 06/12/2012   Aneurysm of abdominal vessel ([place]) 03/12/2012   Dissection of aorta, abdominal  ([place]) 03/12/2012   ABNORMALITY OF GAIT 07/02/2009   SPRAIN&STRAIN OTHER SPECIFIED SITES KNEE&LEG 07/02/2009    REFERRING DIAG:  Z86.718 (ICD-10-CM) - Personal history of other venous thrombosis and embolism  R42 (ICD-10-CM) - Dizziness and giddiness    THERAPY DIAG:  Dizziness and giddiness  Unsteadiness on feet  Other abnormalities of gait and mobility  Rationale for Evaluation and Treatment Rehabilitation  PERTINENT HISTORY:  L cochlear implant, clotting disorder, R hearing loss, HTN  PRECAUTIONS: Other: HOH, hears better in R ear  SUBJECTIVE: Sent his ENT a message about his lack of progress- heard back on Friday. Was told that more testing is an option but not sure when. Patient is feeling about the same today compared to last session. Reports that he hurt his back on Thursday.  PAIN:  Are you having pain? Yes: NPRS scale: 4/10 Pain location: LB Pain description: sharp Aggravating factors: getting out of a chair Relieving factors: -   OBJECTIVE:     TODAY'S TREATMENT: 04/11/22 Activity Comments  R/L open book stretch while tracking hand with gaze 10x each Cues to avoid compensating with hips  Cat cow with head movement x10 Cueing to avoid pushing into pain  standing head turns to targets 2x30" C/o more difficulty to R and 2-3/10 dizziness  standing head turns to targets 2x30"  C/o 2/10 dizziness   standing on foam head turns/nods with EC 2x30" each C/o increased dizziness with head nods; increased anterior LOB with head nods   Standing diagonal VOR cancellation 30" Reports increased difficulty with L D2 pattern; good stability   1/2 turns to targets  C/o mild dizziness; good stability   Gait + head turns/nods 4x Required bracing against wall d/t imbalance and "eye strain"    PATIENT EDUCATION: Education details: discussed avoiding additional dizziness exercises outside of HEP and decreasing intensity of exercises to "focus on the basics" Person educated:  Patient Education method: Explanation Education comprehension: verbalized understanding   HOME EXERCISE PROGRAM Last updated: 03/28/22 Access Code: [email or web address] URL: [email or web address] Date: 03/28/2022 Prepared by: [facility]  Program Notes perform every other day  Exercises - Standing Gaze Stabilization with Head Rotation  - 1 x daily - 3-4 x weekly - 2 sets - 30-40 sec hold - Standing Gaze Stabilization with Head Nod  - 1 x daily - 3-4 x weekly - 2-3 sets - 30-40  sec hold - Gait with Head Nods and Turns on Grass  - 1 x daily - 5 x weekly - 2 sets - 10 reps - Supine to Left Sidelying Vestibular Habituation  - 1 x daily - 5 x weekly - 3 sets - 5 reps - Romberg Stance Eyes Closed on Foam Pad  - 1 x daily - 5 x weekly - 2 sets - 30 sec hold    PATIENT EDUCATION: Education details: prognosis and safety/balance in the dark; HEP update Person educated: Patient  Education method: Explanation, Demonstration, Tactile cues, Verbal cues, and Handouts Education comprehension: verbalized understanding and returned demonstration   DIAGNOSTIC FINDINGS: none recent   COGNITION: Overall cognitive status: Within functional limits for tasks assessed             SENSATION: WFL     POSTURE: rounded shoulders     FUNCTIONAL TESTs:    Romberg with EC 30 sec: moderate-severe sway   Gait: without device shows intermittent path deviation and use of furniture walking   VESTIBULAR ASSESSMENT              GENERAL OBSERVATION: bruising under L eye; patient is wearing bifocals for reading- reports that he wears them most of the time                OCULOMOTOR EXAM:                       Ocular Alignment: normal                       Ocular ROM: No Limitations                       Spontaneous Nystagmus: absent                       Gaze-Induced Nystagmus: absent                       Smooth Pursuits: intact                       Saccades:  intact                       Convergence/Divergence: ~3 cm               VESTIBULAR - OCULAR REFLEX:                        Slow VOR: Comment: horizontal: slower to the L; c/o mild dizziness and L eye pain; vertical: slightly discoordinated neck movement but c/o less head symptoms                        VOR Cancellation: Unable to Maintain Gaze; limited by dizziness                       Head-Impulse Test: HIT Right: slightly positive HIT Left: positive                                    POSITIONAL TESTING:  Right Roll Test: c/o dizziness during movement which dissipates when stopping movement Left Roll Test: c/o dizziness during movement which dissipates when stopping movement Right Dix-Hallpike: negative; c/o dizziness upon laying down<sitting up Left Dix-Hallpike: negative; c/o dizziness upon laying down<sitting up   Dynamic Gait Index    1. Gait level surface __3___ (3) Normal: Walks 20', no assistive devices, good sped, no evidence for imbalance, normal gait pattern (2) Mild Impairment: Walks 20', uses assistive devices, slower speed, mild gait deviations. (1) Moderate Impairment: Walks 20', slow speed, abnormal gait pattern, evidence for imbalance. (0) Severe Impairment: Cannot walk 20' without assistance, severe gait deviations or imbalance.  2. Change in gait speed  __3___ (3) Normal: Able to smoothly change walking speed without loss of balance or gait deviation. Shows a  significant difference in walking speeds between normal, fast and slow speeds. (2) Mild Impairment: Is able to change speed but demonstrates mild gait deviations, or not gait  deviations but unable to achieve a significant change in velocity, or uses an assistive device. (1) Moderate Impairment: Makes only minor adjustments to walking speed, or accomplishes a change  in speed with significant gait deviations, or changes speed but has significant gait deviations, or  changes speed but loses balance but is able  to recover and continue walking. (0) Severe Impairment: Cannot change speeds, or loses balance and has to reach for wall or be caught.  3. Gait with horizontal head turns __2___ (3) Normal: Performs head turns smoothly with no change in gait. (2) Mild Impairment: Performs head turns smoothly with slight change in gait velocity, i.e., minor  disruption to smooth gait path or uses walking aid. (1) Moderate Impairment: Performs head turns with moderate change in gait velocity, slows down,  staggers but recovers, can continue to walk. (0) Severe Impairment: Performs task with severe disruption of gait, i.e., staggers  outside 15" path, loses balance, stops, reaches for wall.  4. Gait with vertical head turns __3___ (3) Normal: Performs head turns smoothly with no change in gait. (2) Mild Impairment: Performs head turns smoothly with slight change in gait velocity, i.e., minor  disruption to smooth gait path or uses walking aid. (1) Moderate Impairment: Performs head turns with moderate change in gait velocity, slows down,  staggers but recovers, can continue to walk. (0) Severe Impairment: Performs task with severe disruption of gait, i.e., staggers  outside 15" path, loses balance, stops, reaches for wall.  5. Gait and pivot turn __3___ (3) Normal: Pivot turns safely within 3 seconds and stops quickly with no loss of balance. (2) Mild Impairment: Pivot turns safely in > 3 seconds and stops with no loss of balance. (1) Moderate Impairment: Turns slowly, requires verbal cueing, requires several small steps to catch  balance following turn and stop. (0) Severe Impairment: Cannot turn safely, requires assistance to turn and stop.  6. Step over obstacle __3___ (3) Normal: Is able to step over the box without changing gait speed, no evidence of imbalance. (2) Mild Impairment: Is able to step over box, but must slow down and adjust steps to clear box safely. (1) Moderate Impairment: Is able to  step over box but must stop, then step over. May require verbal  cueing. (0) Severe Impairment: Cannot perform without assistance.  7. Step around obstacles __3___ (3) Normal: Is able to walk around cones safely without changing gait speed; no evidence of  imbalance. (2) Mild Impairment: Is able to step around both cones, but must slow down and adjust steps to clear  cones. (1) Moderate Impairment: Is able to clear cones but must significantly slow, speed to accomplish task,  or requires verbal cueing. (0) Severe Impairment: Unable to clear cones, walks into one or both cones, or requires physical  assistance.  8. Steps __2___ (3) Normal: Alternating feet, no rail. (2) Mild Impairment: Alternating feet, must use rail. (1) Moderate Impairment: Two feet to a stair, must use rail. (0) Severe Impairment: Cannot do safely.  TOTAL SCORE: __22___/ 24  Interpretation:  < 19/24 = predictive of falls in the elderly > 22/24 = safe ambulators     --tested on 03/14/22      GOALS: Goals reviewed  with patient? Yes   SHORT TERM GOALS: Target date: 02/23/2022   Patient to be independent with initial HEP. Baseline: HEP initiated Goal status: MET       LONG TERM GOALS: Target date: 03/16/2022   Patient to be independent with advanced HEP. Baseline: Not yet initiated  Goal status: on-going   Patient to report 0/10 dizziness with standing vertical and horizontal VOR x 2 for 30 seconds. Baseline: 1/10 with VOR x 1 Goal status: REVISED   Patient will report 0/10 dizziness with bed mobility.  Baseline: Symptomatic, notes 4-5/10 with rolling in bed, repositioning is less problematic Goal status: on-going   Patient to demonstrate mild-moderate sway with M-CTSIB condition with eyes closed/foam surface in order to improve safety in environments with uneven surfaces and dim lighting. Baseline: mild x 30 sec Goal status: MET   Patient to score at least 20/24 on DGI in order to decrease risk  of falls. Baseline: 22/24 Goal status: MET   Patient to report tolerance for 1-2 hour car ride without dizziness limiting.  Baseline: able, no issue Goal status: MET  >  Patient to report and demonstrate steady posture with golf swing and follow-through to enable activity participation without LOB  -Baseline: LOB with follow-through in golf swing  -Goal status: IN PROGRESS       ASSESSMENT:   CLINICAL IMPRESSION: Patient arrived to session with report of no change in dizziness/imbalance since last session. Does report some LBP since Thursday. Worked on gentle thoracolumbar and lumbopelvic mobility exercises to address back pain while incorporating visual tracking to challenge vestibular system. Patient performed head turns/nods to targets with c/o mild dizziness. More dizziness reported with horizontal head movements however increased intermittent LOB was evident in B planes today. Spoke to patient about adjusting intensity of exercises at home and sticking to HEP to maintain more tolerable symptoms.    OBJECTIVE IMPAIRMENTS Abnormal gait, decreased balance, difficulty walking, and dizziness.    ACTIVITY LIMITATIONS carrying, lifting, bending, standing, squatting, sleeping, stairs, transfers, bed mobility, bathing, dressing, and reach over head   PARTICIPATION LIMITATIONS: meal prep, cleaning, laundry, driving, shopping, community activity, occupation, yard work, and church   PERSONAL FACTORS Age, Fitness, Past/current experiences, Time since onset of injury/illness/exacerbation, and 3+ comorbidities: L cochlear implant, clotting disorder, R hearing loss, HTN  are also affecting patient's functional outcome.    REHAB POTENTIAL: Good   CLINICAL DECISION MAKING: Evolving/moderate complexity   EVALUATION COMPLEXITY: Moderate     PLAN: PT FREQUENCY: 1x/week/   PT DURATION: 10 weeks   PLANNED INTERVENTIONS: Therapeutic exercises, Therapeutic activity, Neuromuscular re-education,  Balance training, Gait training, Patient/Family education, Joint mobilization, Stair training, Vestibular training, Canalith repositioning, Dry Needling, Electrical stimulation, Cryotherapy, Moist heat, Taping, Manual therapy, and Re-evaluation   PLAN FOR NEXT SESSION: reassess HEP, progress VOR and habituation activities (golf swing and follow-through)  Janene Harvey, PT, DPT 04/11/22 11:04 AM  [facility] [place], [address], [place] [address] Phone # [phone] Fax # [phone]

## 2022-04-11 ENCOUNTER — Encounter: Payer: Self-pay | Admitting: Physical Therapy

## 2022-04-11 ENCOUNTER — Ambulatory Visit: Payer: Medicare Other | Admitting: Physical Therapy

## 2022-04-11 DIAGNOSIS — R42 Dizziness and giddiness: Secondary | ICD-10-CM

## 2022-04-11 DIAGNOSIS — R2681 Unsteadiness on feet: Secondary | ICD-10-CM

## 2022-04-11 DIAGNOSIS — R2689 Other abnormalities of gait and mobility: Secondary | ICD-10-CM

## 2022-04-15 NOTE — Therapy (Addendum)
OUTPATIENT PHYSICAL THERAPY TREATMENT NOTE   Patient Name: Don Ruiz MRN: [id number] DOB:1951-02-19, 71 y.o., male Today's Date: 04/18/2022  PCP: Lavone Orn, MD REFERRING PROVIDER: N/A   Progress Note Reporting Period 02/02/22 to 05/23/22  See note below for Objective Data and Assessment of Progress/Goals.       END OF SESSION:   PT End of Session - 04/18/22 0927     Visit Number 10    Number of Visits 16    Date for PT Re-Evaluation 05/23/22    Authorization Type [employer] Medicare    Progress Note Due on Visit 16    PT Start Time 0849    PT Stop Time 0925    PT Time Calculation (min) 36 min    Equipment Utilized During Treatment Gait belt    Activity Tolerance Patient tolerated treatment well    Behavior During Therapy WFL for tasks assessed/performed                 Past Medical History:  Diagnosis Date   Ankle impingement syndrome    Right ankle from bone spur   Antiphospholipid antibody syndrome ([place]) 06/12/2012   Coagulopathy ([place]) 06/12/2012   DVT (deep venous thrombosis) ([place]) 1990, 2002   chronic hypercoagulobility.    DVT, recurrent, lower extremity, chronic 06/12/2012   1992   Dysrhythmia    AF   GERD (gastroesophageal reflux disease)    H/O tinnitus    Heterozygous factor V Leiden mutation ([place]) 06/12/2012   Hyperlipidemia    Migraine headache    Protein S deficiency ([place]) 06/12/2012   Prothrombin gene mutation ([place]) 06/12/2012   Pulmonary embolism ([place])    Pulmonary embolism, bilateral ([place]) 06/12/2012   July, 2002   PVC (premature ventricular contraction)    Varicose veins 06/12/2012   RLE   Wears hearing aid    Past Surgical History:  Procedure Laterality Date   ABDOMINAL AORTIC ANEURYSM REPAIR  12/14   [facility]   ANKLE SURGERY  2005   right   APPENDECTOMY     ARTERY BIOPSY Left 08/12/2014   Procedure: LEFT TEMPORAL ARTERY BIOPSY ;  Surgeon: Coralie Keens, MD;  Location: [place] [facility];  Service: General;  Laterality:  Left;   CHOLECYSTECTOMY  2008   lap [facility]   COLONOSCOPY N/A 09/03/2013   Procedure: COLONOSCOPY;  Surgeon: Garlan Fair, MD;  Location: [facility];  Service: Endoscopy;  Laterality: N/A;   ESOPHAGOGASTRODUODENOSCOPY N/A 01/05/2022   Procedure: ESOPHAGOGASTRODUODENOSCOPY (EGD);  Surgeon: Arta Silence, MD;  Location: [facility];  Service: Gastroenterology;  Laterality: N/A;   FINGER SURGERY     Right 1st finger,  by Dr. Kalman Shan TENDON REPAIR  2013   left 5th finger by Dr. Caralyn Guile   INCISION AND DRAINAGE HIP Left 02/17/2021   Procedure: IRRIGATION AND DEBRIDEMENT LEFT THIGH;  Surgeon: Dorna Leitz, MD;  Location: [facility];  Service: Orthopedics;  Laterality: Left;   PATELLA RELEASE AND MANIPULATION  1992   Right [place]   UPPER ESOPHAGEAL ENDOSCOPIC ULTRASOUND (EUS) Bilateral 01/05/2022   Procedure: UPPER ESOPHAGEAL ENDOSCOPIC ULTRASOUND (EUS);  Surgeon: Arta Silence, MD;  Location: [facility];  Service: Gastroenterology;  Laterality: Bilateral;   Patient Active Problem List   Diagnosis Date Noted   Vertigo 08/28/2021   PAF (paroxysmal atrial fibrillation) ([place]) 08/28/2021   Hematoma of left thigh 02/17/2021   Elevated LFTs    Acute epigastric pain 02/04/2017   HTN (hypertension) 09/27/2013   SNHL (sensorineural hearing loss) 05/24/2013  CAD (coronary artery disease) 04/18/2013   Chronic anticoagulation 04/18/2013   GERD (gastroesophageal reflux disease) 04/18/2013   History of pulmonary embolism 04/18/2013   Blood clotting disorder ([place]) 06/12/2012   Heterozygous factor V Leiden mutation ([place]) 06/12/2012   Prothrombin gene mutation ([place]) 06/12/2012   Antiphospholipid antibody syndrome ([place]) 06/12/2012   Protein S deficiency ([place]) 06/12/2012   DVT, recurrent, lower extremity, chronic 06/12/2012   Pulmonary embolism, bilateral ([place]) 06/12/2012   Varicose veins 06/12/2012   Chronic venous embolism and thrombosis of deep vessels of lower extremity ([place]) 06/12/2012    Primary hypercoagulable state ([place]) 06/12/2012   Aneurysm of abdominal vessel ([place]) 03/12/2012   Dissection of aorta, abdominal ([place]) 03/12/2012   ABNORMALITY OF GAIT 07/02/2009   SPRAIN&STRAIN OTHER SPECIFIED SITES KNEE&LEG 07/02/2009    REFERRING DIAG:  Z86.718 (ICD-10-CM) - Personal history of other venous thrombosis and embolism  R42 (ICD-10-CM) - Dizziness and giddiness    THERAPY DIAG:  Dizziness and giddiness  Unsteadiness on feet  Other abnormalities of gait and mobility  Rationale for Evaluation and Treatment Rehabilitation  PERTINENT HISTORY:  L cochlear implant, clotting disorder, R hearing loss, HTN  PRECAUTIONS: Other: HOH, hears better in R ear  SUBJECTIVE: Has not heard back from ENT. Asking about if he should drive to [place] this weekend. Reports that after he stops driving, he has to lean on the car for a little bit. Back is feeling better.  PAIN:  Are you having pain? No   OBJECTIVE:     TODAY'S TREATMENT: 04/18/22 Activity Comments  Standing compensatory saccades 2x30" horizontal  Good tolerance   Standing compensatory saccades 2x30" vertical  Good tolerance   1/4 turn: spotting with head, then turning body Cues to increase speed; good tolerance   1/2 turn: spotting with head, then turning body C/o more dizziness to the L; more difficulty completing last 1/4 turn   golf stance + head turns to target 2x10  C/o dizziness       HOME EXERCISE PROGRAM Last updated: 04/18/22 Access Code: [email or web address] URL: [email or web address] Date: 04/18/2022 Prepared by: [facility]  Program Notes perform every other day  Exercises - Standing Gaze Stabilization with Head Rotation  - 1 x daily - 3-4 x weekly - 2 sets - 30-40 sec hold - Standing Gaze Stabilization with Head Nod  - 1 x daily - 3-4 x weekly - 2-3 sets - 30-40  sec hold - Gait with Head Nods and Turns on Grass  - 1 x daily - 5 x weekly - 2 sets - 10 reps -  Supine to Left Sidelying Vestibular Habituation  - 1 x daily - 5 x weekly - 3 sets - 5 reps - Romberg Stance Eyes Closed on Foam Pad  - 1 x daily - 5 x weekly - 2 sets - 30 sec hold - 180 Degree Pivot Turn with Single Point Cane  - 1 x daily - 5 x weekly - 2 sets - 10 reps - Seated Horizontal Saccades  - 1 x daily - 5 x weekly - 2 sets - 10 reps - Seated Vertical Saccades  - 1 x daily - 5 x weekly - 2 sets - 10 reps - Standing with Head Rotation  - 1 x daily - 5 x weekly - 2-3 sets - 10 reps    PATIENT EDUCATION: Education details: discussion on role of vision as compensatory system to address balance and dizziness; spoke about plan to hold  PT until patient sees his ENT; HEP update; provided MD note for ENT Person educated: Patient Education method: Explanation, Demonstration, Tactile cues, Verbal cues, and Handouts Education comprehension: verbalized understanding and returned demonstration  Below measures were taken at time of initial evaluation unless otherwise specified:   DIAGNOSTIC FINDINGS: none recent   COGNITION: Overall cognitive status: Within functional limits for tasks assessed             SENSATION: WFL     POSTURE: rounded shoulders     FUNCTIONAL TESTs:    Romberg with EC 30 sec: moderate-severe sway   Gait: without device shows intermittent path deviation and use of furniture walking   VESTIBULAR ASSESSMENT              GENERAL OBSERVATION: bruising under L eye; patient is wearing bifocals for reading- reports that he wears them most of the time                OCULOMOTOR EXAM:                       Ocular Alignment: normal                       Ocular ROM: No Limitations                       Spontaneous Nystagmus: absent                       Gaze-Induced Nystagmus: absent                       Smooth Pursuits: intact                       Saccades: intact                       Convergence/Divergence: ~3 cm               VESTIBULAR - OCULAR REFLEX:                         Slow VOR: Comment: horizontal: slower to the L; c/o mild dizziness and L eye pain; vertical: slightly discoordinated neck movement but c/o less head symptoms                        VOR Cancellation: Unable to Maintain Gaze; limited by dizziness                       Head-Impulse Test: HIT Right: slightly positive HIT Left: positive                                    POSITIONAL TESTING:  Right Roll Test: c/o dizziness during movement which dissipates when stopping movement Left Roll Test: c/o dizziness during movement which dissipates when stopping movement Right Dix-Hallpike: negative; c/o dizziness upon laying down<sitting up Left Dix-Hallpike: negative; c/o dizziness upon laying down<sitting up   Dynamic Gait Index    1. Gait level surface __3___ (3) Normal: Walks 20', no assistive devices, good sped, no evidence for imbalance, normal gait pattern (2) Mild Impairment: Walks 20', uses assistive devices, slower speed, mild gait deviations. (1) Moderate Impairment:  Walks 20', slow speed, abnormal gait pattern, evidence for imbalance. (0) Severe Impairment: Cannot walk 20' without assistance, severe gait deviations or imbalance.  2. Change in gait speed __3___ (3) Normal: Able to smoothly change walking speed without loss of balance or gait deviation. Shows a  significant difference in walking speeds between normal, fast and slow speeds. (2) Mild Impairment: Is able to change speed but demonstrates mild gait deviations, or not gait  deviations but unable to achieve a significant change in velocity, or uses an assistive device. (1) Moderate Impairment: Makes only minor adjustments to walking speed, or accomplishes a change  in speed with significant gait deviations, or changes speed but has significant gait deviations, or  changes speed but loses balance but is able to recover and continue walking. (0) Severe Impairment: Cannot change speeds, or loses balance and has  to reach for wall or be caught.  3. Gait with horizontal head turns __2___ (3) Normal: Performs head turns smoothly with no change in gait. (2) Mild Impairment: Performs head turns smoothly with slight change in gait velocity, i.e., minor  disruption to smooth gait path or uses walking aid. (1) Moderate Impairment: Performs head turns with moderate change in gait velocity, slows down,  staggers but recovers, can continue to walk. (0) Severe Impairment: Performs task with severe disruption of gait, i.e., staggers  outside 15" path, loses balance, stops, reaches for wall.  4. Gait with vertical head turns __3___ (3) Normal: Performs head turns smoothly with no change in gait. (2) Mild Impairment: Performs head turns smoothly with slight change in gait velocity, i.e., minor  disruption to smooth gait path or uses walking aid. (1) Moderate Impairment: Performs head turns with moderate change in gait velocity, slows down,  staggers but recovers, can continue to walk. (0) Severe Impairment: Performs task with severe disruption of gait, i.e., staggers  outside 15" path, loses balance, stops, reaches for wall.  5. Gait and pivot turn __3___ (3) Normal: Pivot turns safely within 3 seconds and stops quickly with no loss of balance. (2) Mild Impairment: Pivot turns safely in > 3 seconds and stops with no loss of balance. (1) Moderate Impairment: Turns slowly, requires verbal cueing, requires several small steps to catch  balance following turn and stop. (0) Severe Impairment: Cannot turn safely, requires assistance to turn and stop.  6. Step over obstacle __3___ (3) Normal: Is able to step over the box without changing gait speed, no evidence of imbalance. (2) Mild Impairment: Is able to step over box, but must slow down and adjust steps to clear box safely. (1) Moderate Impairment: Is able to step over box but must stop, then step over. May require verbal  cueing. (0) Severe Impairment: Cannot  perform without assistance.  7. Step around obstacles __3___ (3) Normal: Is able to walk around cones safely without changing gait speed; no evidence of  imbalance. (2) Mild Impairment: Is able to step around both cones, but must slow down and adjust steps to clear  cones. (1) Moderate Impairment: Is able to clear cones but must significantly slow, speed to accomplish task,  or requires verbal cueing. (0) Severe Impairment: Unable to clear cones, walks into one or both cones, or requires physical  assistance.  8. Steps __2___ (3) Normal: Alternating feet, no rail. (2) Mild Impairment: Alternating feet, must use rail. (1) Moderate Impairment: Two feet to a stair, must use rail. (0) Severe Impairment: Cannot do safely.  TOTAL SCORE: __22___/ 24  Interpretation:  <  19/24 = predictive of falls in the elderly > 22/24 = safe ambulators     --tested on 03/14/22      GOALS: Goals reviewed with patient? Yes   SHORT TERM GOALS: Target date: 02/23/2022   Patient to be independent with initial HEP. Baseline: HEP initiated Goal status: MET       LONG TERM GOALS: Target date: 03/16/2022   Patient to be independent with advanced HEP. Baseline: Not yet initiated  Goal status: on-going   Patient to report 0/10 dizziness with standing vertical and horizontal VOR x 2 for 30 seconds. Baseline: 1/10 with VOR x 1 Goal status: REVISED   Patient will report 0/10 dizziness with bed mobility.  Baseline: Symptomatic, notes 4-5/10 with rolling in bed, repositioning is less problematic Goal status: on-going   Patient to demonstrate mild-moderate sway with M-CTSIB condition with eyes closed/foam surface in order to improve safety in environments with uneven surfaces and dim lighting. Baseline: mild x 30 sec Goal status: MET   Patient to score at least 20/24 on DGI in order to decrease risk of falls. Baseline: 22/24 Goal status: MET   Patient to report tolerance for 1-2 hour car ride  without dizziness limiting.  Baseline: able, no issue Goal status: MET  >  Patient to report and demonstrate steady posture with golf swing and follow-through to enable activity participation without LOB  -Baseline: LOB with follow-through in golf swing  -Goal status: IN PROGRESS       ASSESSMENT:   CLINICAL IMPRESSION: Patient arrived to session with questions about if he should drive to [place] this weekend. Reports no symptoms while behind the week; only some dizziness that is transient after stopping driving, thus confirmed that this would be fine. Encouraged patient to see ENT for further testing if it is indicated and provided note from PT to ENT on limited progress in rehab thus far.  Initiated compensatory saccade training to improved dizziness and balance tolerance. Patient reported understanding of HEP update and edu and without complaints at end of session. Planning for PT hold until f/u with ENT on 04/28/22.    OBJECTIVE IMPAIRMENTS Abnormal gait, decreased balance, difficulty walking, and dizziness.    ACTIVITY LIMITATIONS carrying, lifting, bending, standing, squatting, sleeping, stairs, transfers, bed mobility, bathing, dressing, and reach over head   PARTICIPATION LIMITATIONS: meal prep, cleaning, laundry, driving, shopping, community activity, occupation, yard work, and church   PERSONAL FACTORS Age, Fitness, Past/current experiences, Time since onset of injury/illness/exacerbation, and 3+ comorbidities: L cochlear implant, clotting disorder, R hearing loss, HTN  are also affecting patient's functional outcome.    REHAB POTENTIAL: Good   CLINICAL DECISION MAKING: Evolving/moderate complexity   EVALUATION COMPLEXITY: Moderate     PLAN: PT FREQUENCY: 1x/week/   PT DURATION: 10 weeks   PLANNED INTERVENTIONS: Therapeutic exercises, Therapeutic activity, Neuromuscular re-education, Balance training, Gait training, Patient/Family education, Joint mobilization, Stair training,  Vestibular training, Canalith repositioning, Dry Needling, Electrical stimulation, Cryotherapy, Moist heat, Taping, Manual therapy, and Re-evaluation   PLAN FOR NEXT SESSION: reassess HEP, progress VOR and habituation activities (golf swing and follow-through)  Janene Harvey, PT, DPT 04/18/22 9:28 AM  [facility] [address], [place], [place] [address] Phone # [phone] Fax # [phone]     PHYSICAL THERAPY DISCHARGE SUMMARY  Visits from Start of Care: 10  Current functional level related to goals / functional outcomes: Unable to assess; patient did not return after PT hold  Remaining deficits: Dizziness/imbalance with bed mobility and golf    Education / Equipment: HEP  Plan: Patient agrees to discharge.  Patient goals were partially met. Patient is being discharged due to plateau.     Janene Harvey, PT, DPT 05/25/22 1:43 PM  [facility] at [facility] [address], [place], [place] [address] Phone # [phone] Fax # [phone]

## 2022-04-18 ENCOUNTER — Ambulatory Visit: Payer: Medicare Other | Admitting: Physical Therapy

## 2022-04-18 ENCOUNTER — Encounter: Payer: Self-pay | Admitting: Physical Therapy

## 2022-04-18 DIAGNOSIS — R42 Dizziness and giddiness: Secondary | ICD-10-CM

## 2022-04-18 DIAGNOSIS — R2681 Unsteadiness on feet: Secondary | ICD-10-CM

## 2022-04-18 DIAGNOSIS — R2689 Other abnormalities of gait and mobility: Secondary | ICD-10-CM

## 2022-04-25 ENCOUNTER — Ambulatory Visit: Payer: Medicare Other | Admitting: Physical Therapy

## 2022-04-27 ENCOUNTER — Ambulatory Visit: Payer: Medicare Other | Admitting: Physical Therapy

## 2023-01-17 IMAGING — MR MR HEAD WO/W CM
12 of 14 series · 38 of 48 positions shown · IV contrast (Gadavist)
Comparison: Brain MRI 12/31/2009

CLINICAL DATA: Dizziness, arrhythmia or new vasoactive medication
vertigo, hearing loss, concern for stroke or retrocochlear lesion.
Sudden loss of hearing in L ear this morning. Pt had similar event
12 years ago in R ear, never regained hearing.

EXAM:
MRI HEAD WITHOUT AND WITH CONTRAST
TECHNIQUE: Multiplanar, multiecho pulse sequences of the brain and surrounding
structures were obtained without and with intravenous contrast.
CONTRAST:  10mL GADAVIST GADOBUTROL 1 MMOL/ML IV SOLN

[Series 5: DWI · axial · 3.0mm · 0.88mm/px · z∈[-95,+48]mm · 7 of 100 slices shown (1 of 4)]
[im 1/100]
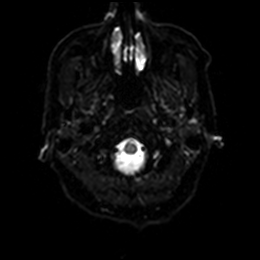
[im 17/100]
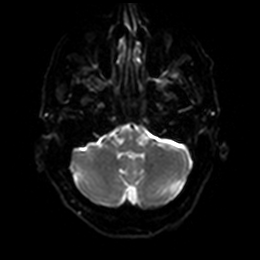
[im 34/100]
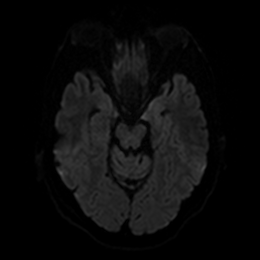
[im 50/100]
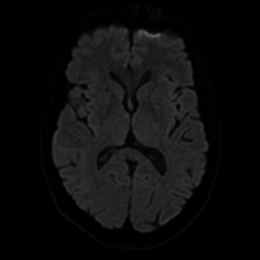
[im 67/100]
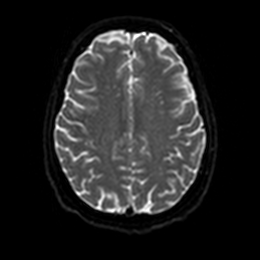
[im 83/100]
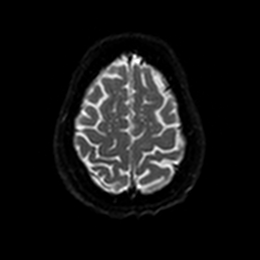
[im 100/100]
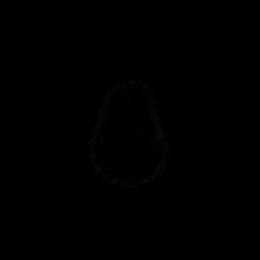

[Series 6: DWI · axial · 3.0mm · 0.88mm/px · z∈[-95,+48]mm · 3 of 48 slices shown (2 of 4)]
[im 1/48]
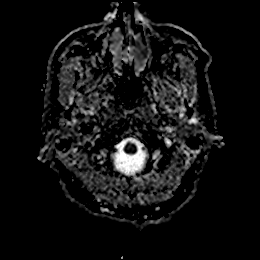
[im 24/48]
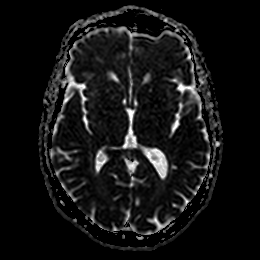
[im 48/48]
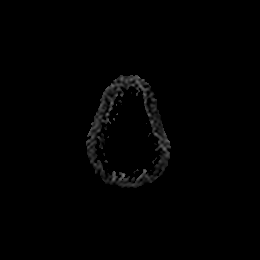

[Series 7: DWI · coronal · 4.0mm · 0.88mm/px · 5 of 66 slices shown (3 of 4)]
[im 1/66]
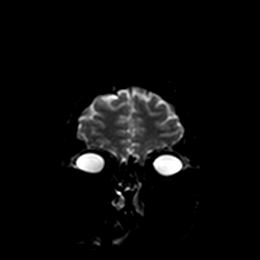
[im 17/66]
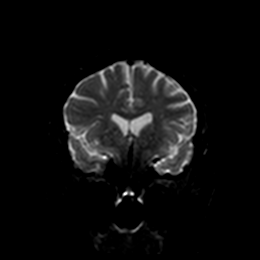
[im 33/66]
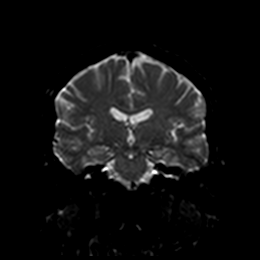
[im 49/66]
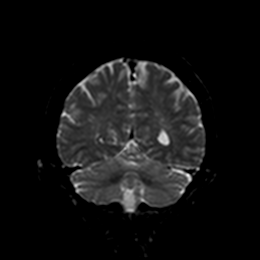
[im 66/66]
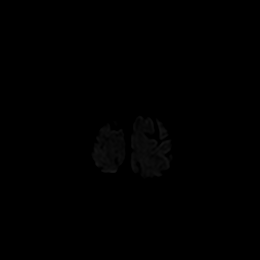

[Series 8: DWI · coronal · 4.0mm · 0.88mm/px · 2 of 33 slices shown (4 of 4)]
[im 1/33]
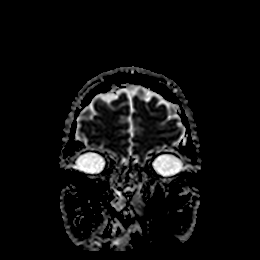
[im 33/33]
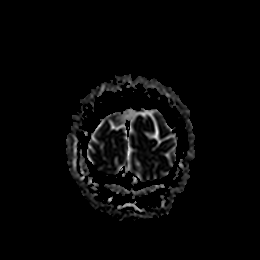

[Series 9: T1 · sagittal · 5.0mm · 0.78mm/px · 2 of 23 slices shown]
[im 1/23]
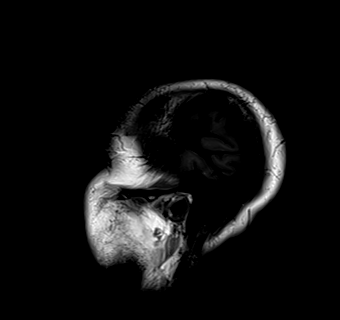
[im 23/23]
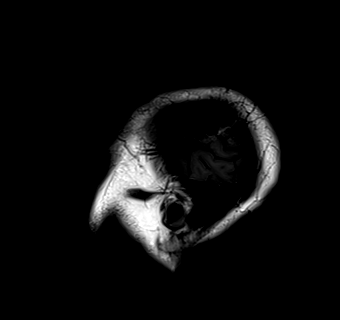

[Series 10: T2 · axial · 5.0mm · 0.72mm/px · z∈[-100,+53]mm · 2 of 27 slices shown]
[im 1/27]
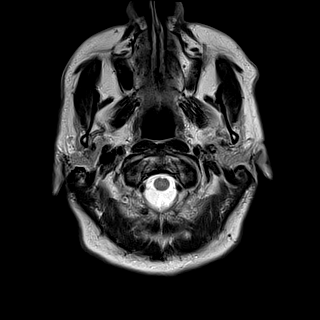
[im 27/27]
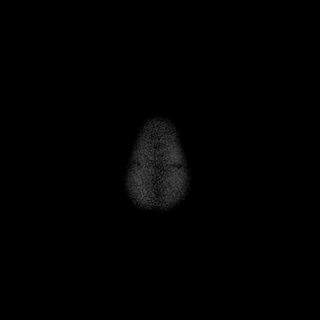

[Series 11: FLAIR · axial · 5.0mm · 0.45mm/px · z∈[-96,+56]mm · 2 of 27 slices shown]
[im 1/27]
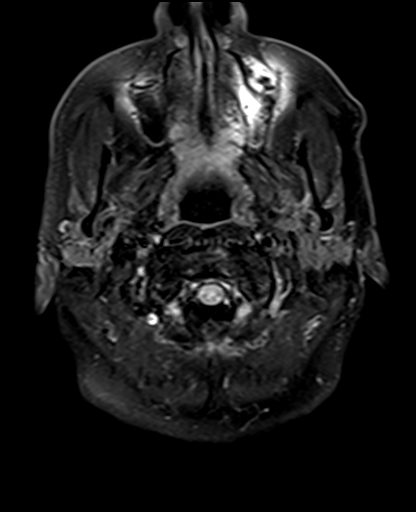
[im 27/27]
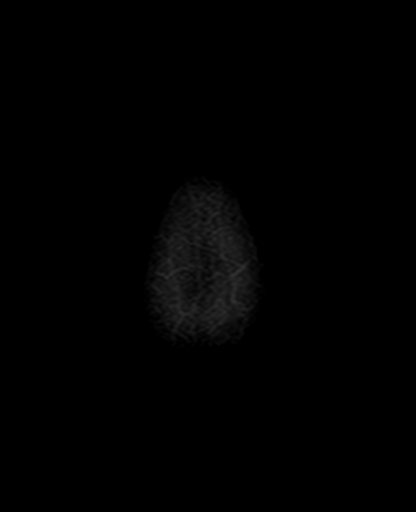

[Series 13: pha_images · axial · 3.0mm · 0.90mm/px · z∈[-104,+63]mm · 4 of 56 slices shown]
[im 1/56]
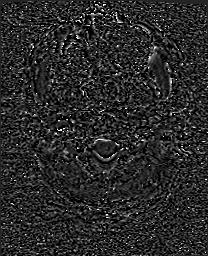
[im 19/56]
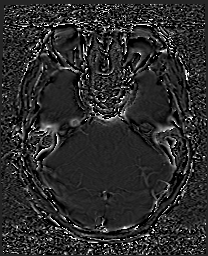
[im 37/56]
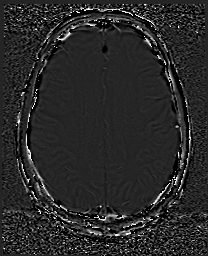
[im 56/56]
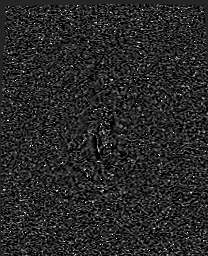

[Series 14: swi_images · axial · 3.0mm · 0.90mm/px · z∈[-107,+66]mm · 5 of 60 slices shown]
[im 1/60]
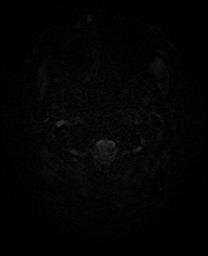
[im 15/60]
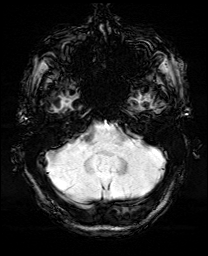
[im 30/60]
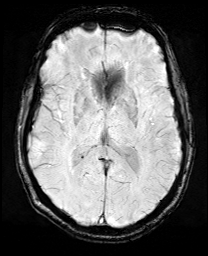
[im 45/60]
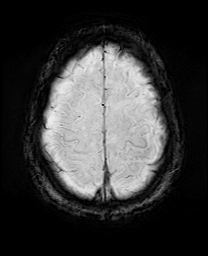
[im 60/60]
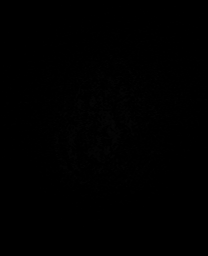

[Series 17: T2 post-contrast · coronal · 5.0mm · 0.72mm/px · 2 of 28 slices shown]
[im 1/28]
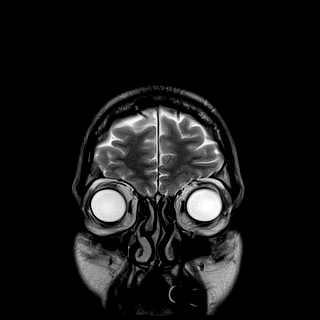
[im 28/28]
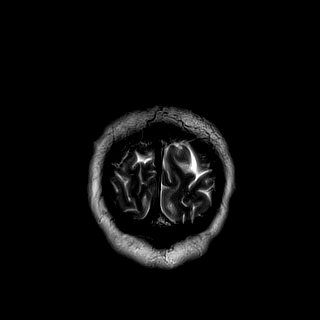

[Series 19: T1 post-contrast · coronal · 5.0mm · 0.34mm/px · 2 of 28 slices shown (1 of 2)]
[im 1/28]
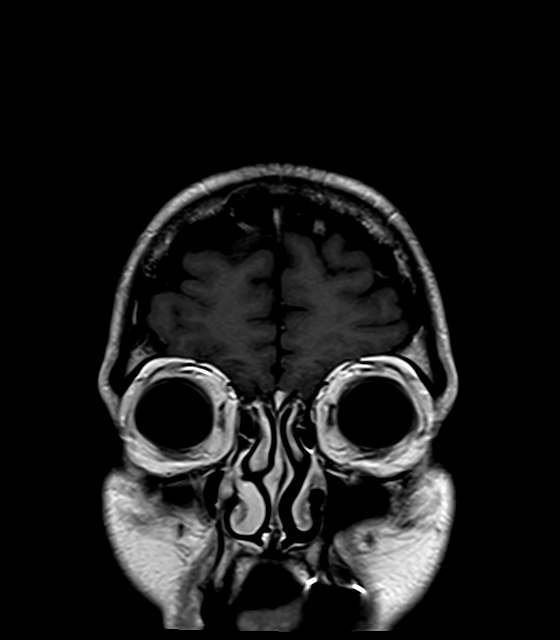
[im 28/28]
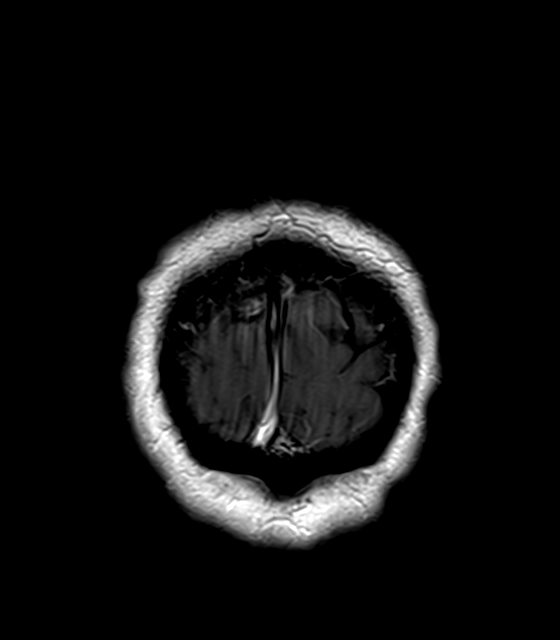

[Series 21: T1 post-contrast · sagittal · 5.0mm · 0.75mm/px · 2 of 23 slices shown (2 of 2)]
[im 1/23]
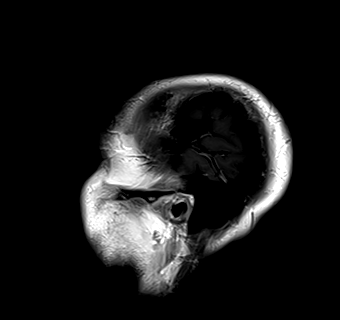
[im 23/23]
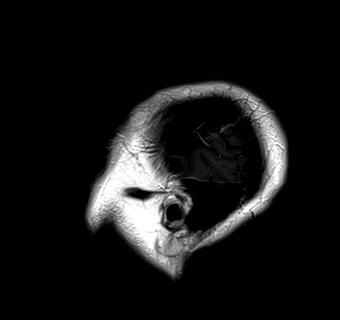

[38 of 48 positions shown; findings below may reference images not displayed]

FINDINGS: Brain: No acute infarct, mass effect or extra-axial collection. No
acute or chronic hemorrhage. There is multifocal hyperintense
T2-weighted signal within the white matter. Generalized volume loss
without a clear lobar predilection. The midline structures are
normal.

Vascular: Major flow voids are preserved.

Skull and upper cervical spine: Normal calvarium and skull base.
Visualized upper cervical spine and soft tissues are normal.

Sinuses/Orbits:No paranasal sinus fluid levels or advanced mucosal
thickening. No mastoid or middle ear effusion. Normal orbits.
IMPRESSION: 1. No acute intracranial abnormality.
2. Findings of mild chronic microvascular ischemia and generalized
volume loss.

## 2024-09-24 ENCOUNTER — Other Ambulatory Visit: Payer: Self-pay | Admitting: Internal Medicine

## 2024-09-24 DIAGNOSIS — J479 Bronchiectasis, uncomplicated: Secondary | ICD-10-CM

## 2024-09-24 DIAGNOSIS — R9389 Abnormal findings on diagnostic imaging of other specified body structures: Secondary | ICD-10-CM

## 2024-10-15 ENCOUNTER — Other Ambulatory Visit
